# Patient Record
Sex: Male | Born: 1945
Health system: Southern US, Community
[De-identification: ages and names within clinical notes are randomized; demographics above are authoritative.]

## PROBLEM LIST (undated history)

## (undated) DIAGNOSIS — K219 Gastro-esophageal reflux disease without esophagitis: Secondary | ICD-10-CM

## (undated) DIAGNOSIS — Z860100 Personal history of colon polyps, unspecified: Secondary | ICD-10-CM

## (undated) DIAGNOSIS — K922 Gastrointestinal hemorrhage, unspecified: Secondary | ICD-10-CM

## (undated) DIAGNOSIS — I10 Essential (primary) hypertension: Secondary | ICD-10-CM

## (undated) DIAGNOSIS — E785 Hyperlipidemia, unspecified: Secondary | ICD-10-CM

## (undated) DIAGNOSIS — R928 Other abnormal and inconclusive findings on diagnostic imaging of breast: Secondary | ICD-10-CM

## (undated) DIAGNOSIS — D131 Benign neoplasm of stomach: Secondary | ICD-10-CM

## (undated) DIAGNOSIS — R198 Other specified symptoms and signs involving the digestive system and abdomen: Secondary | ICD-10-CM

## (undated) DIAGNOSIS — M1990 Unspecified osteoarthritis, unspecified site: Secondary | ICD-10-CM

## (undated) DIAGNOSIS — E039 Hypothyroidism, unspecified: Secondary | ICD-10-CM

## (undated) DIAGNOSIS — C801 Malignant (primary) neoplasm, unspecified: Secondary | ICD-10-CM

## (undated) HISTORY — DX: Benign neoplasm of stomach: D13.1

## (undated) HISTORY — DX: Gastro-esophageal reflux disease without esophagitis: K21.9

## (undated) HISTORY — DX: Personal history of colonic polyps: Z86.010

## (undated) HISTORY — DX: Hyperlipidemia, unspecified: E78.5

## (undated) HISTORY — DX: Essential (primary) hypertension: I10

## (undated) HISTORY — PX: UPPER GASTROINTESTINAL ENDOSCOPY: SHX188

## (undated) HISTORY — DX: Personal history of colon polyps, unspecified: Z86.0100

## (undated) HISTORY — PX: COLONOSCOPY: SHX174

## (undated) HISTORY — PX: TONSILLECTOMY: SUR1361

## (undated) HISTORY — DX: Other abnormal and inconclusive findings on diagnostic imaging of breast: R92.8

## (undated) HISTORY — DX: Gastrointestinal hemorrhage, unspecified: K92.2

---

## 1982-02-15 HISTORY — PX: TUMOR REMOVAL: SHX12

## 2002-12-28 ENCOUNTER — Encounter: Payer: Self-pay | Admitting: Internal Medicine

## 2003-02-16 DIAGNOSIS — K922 Gastrointestinal hemorrhage, unspecified: Secondary | ICD-10-CM

## 2003-02-16 HISTORY — DX: Gastrointestinal hemorrhage, unspecified: K92.2

## 2003-05-24 ENCOUNTER — Ambulatory Visit (HOSPITAL_COMMUNITY): Admission: RE | Admit: 2003-05-24 | Discharge: 2003-05-24 | Payer: Self-pay | Admitting: Internal Medicine

## 2003-05-24 ENCOUNTER — Encounter (INDEPENDENT_AMBULATORY_CARE_PROVIDER_SITE_OTHER): Payer: Self-pay | Admitting: *Deleted

## 2003-05-24 ENCOUNTER — Encounter: Payer: Self-pay | Admitting: Internal Medicine

## 2003-05-24 LAB — HM COLONOSCOPY

## 2003-06-04 ENCOUNTER — Observation Stay (HOSPITAL_COMMUNITY): Admission: EM | Admit: 2003-06-04 | Discharge: 2003-06-05 | Payer: Self-pay | Admitting: Emergency Medicine

## 2004-01-06 ENCOUNTER — Ambulatory Visit: Payer: Self-pay | Admitting: Internal Medicine

## 2004-09-23 ENCOUNTER — Ambulatory Visit: Payer: Self-pay | Admitting: Internal Medicine

## 2004-09-30 ENCOUNTER — Ambulatory Visit: Payer: Self-pay | Admitting: Internal Medicine

## 2005-02-10 ENCOUNTER — Ambulatory Visit: Payer: Self-pay | Admitting: Internal Medicine

## 2005-04-20 ENCOUNTER — Ambulatory Visit: Payer: Self-pay | Admitting: Internal Medicine

## 2005-04-23 ENCOUNTER — Ambulatory Visit: Payer: Self-pay | Admitting: Internal Medicine

## 2005-05-06 ENCOUNTER — Encounter: Payer: Self-pay | Admitting: Internal Medicine

## 2005-05-06 ENCOUNTER — Ambulatory Visit: Payer: Self-pay | Admitting: Internal Medicine

## 2005-05-07 ENCOUNTER — Ambulatory Visit: Payer: Self-pay | Admitting: Cardiovascular Disease

## 2005-05-27 ENCOUNTER — Ambulatory Visit: Payer: Self-pay | Admitting: Cardiovascular Disease

## 2005-05-27 ENCOUNTER — Ambulatory Visit (HOSPITAL_COMMUNITY): Admission: RE | Admit: 2005-05-27 | Discharge: 2005-05-27 | Payer: Self-pay | Admitting: Cardiovascular Disease

## 2005-07-13 ENCOUNTER — Ambulatory Visit: Payer: Self-pay | Admitting: Internal Medicine

## 2005-07-14 ENCOUNTER — Ambulatory Visit: Payer: Self-pay | Admitting: Internal Medicine

## 2005-07-30 ENCOUNTER — Ambulatory Visit: Payer: Self-pay | Admitting: Internal Medicine

## 2005-08-25 ENCOUNTER — Ambulatory Visit: Payer: Self-pay | Admitting: Internal Medicine

## 2005-11-22 ENCOUNTER — Ambulatory Visit: Payer: Self-pay | Admitting: Internal Medicine

## 2006-01-03 ENCOUNTER — Ambulatory Visit: Payer: Self-pay | Admitting: Internal Medicine

## 2006-02-09 ENCOUNTER — Emergency Department (HOSPITAL_COMMUNITY): Admission: EM | Admit: 2006-02-09 | Discharge: 2006-02-09 | Payer: Self-pay | Admitting: Emergency Medicine

## 2006-02-21 ENCOUNTER — Ambulatory Visit: Payer: Self-pay | Admitting: Internal Medicine

## 2006-02-28 ENCOUNTER — Ambulatory Visit: Payer: Self-pay | Admitting: Internal Medicine

## 2006-02-28 ENCOUNTER — Encounter: Admission: RE | Admit: 2006-02-28 | Discharge: 2006-02-28 | Payer: Self-pay | Admitting: Internal Medicine

## 2006-02-28 LAB — CONVERTED CEMR LAB: Uric Acid, Serum: 5.3 mg/dL (ref 2.4–7.0)

## 2006-04-11 ENCOUNTER — Ambulatory Visit: Payer: Self-pay | Admitting: Internal Medicine

## 2006-06-01 ENCOUNTER — Ambulatory Visit: Payer: Self-pay | Admitting: Internal Medicine

## 2006-08-18 DIAGNOSIS — I10 Essential (primary) hypertension: Secondary | ICD-10-CM | POA: Insufficient documentation

## 2006-08-18 DIAGNOSIS — Z860100 Personal history of colon polyps, unspecified: Secondary | ICD-10-CM | POA: Insufficient documentation

## 2006-08-18 DIAGNOSIS — M109 Gout, unspecified: Secondary | ICD-10-CM | POA: Insufficient documentation

## 2006-10-18 ENCOUNTER — Telehealth: Payer: Self-pay | Admitting: Internal Medicine

## 2006-10-28 ENCOUNTER — Telehealth: Payer: Self-pay | Admitting: Internal Medicine

## 2006-12-01 ENCOUNTER — Ambulatory Visit: Payer: Self-pay | Admitting: Internal Medicine

## 2006-12-01 LAB — CONVERTED CEMR LAB
ALT: 16 units/L (ref 0–53)
AST: 19 units/L (ref 0–37)
Albumin: 3.8 g/dL (ref 3.5–5.2)
Alkaline Phosphatase: 69 units/L (ref 39–117)
BUN: 14 mg/dL (ref 6–23)
Basophils Absolute: 0 10*3/uL (ref 0.0–0.1)
Basophils Relative: 0.4 % (ref 0.0–1.0)
Bilirubin Urine: NEGATIVE
Bilirubin, Direct: 0.2 mg/dL (ref 0.0–0.3)
CO2: 30 meq/L (ref 19–32)
Calcium: 9.1 mg/dL (ref 8.4–10.5)
Chloride: 107 meq/L (ref 96–112)
Cholesterol: 160 mg/dL (ref 0–200)
Creatinine, Ser: 0.9 mg/dL (ref 0.4–1.5)
Direct LDL: 86.8 mg/dL
Eosinophils Absolute: 0.3 10*3/uL (ref 0.0–0.6)
Eosinophils Relative: 5.3 % — ABNORMAL HIGH (ref 0.0–5.0)
GFR calc Af Amer: 110 mL/min
GFR calc non Af Amer: 91 mL/min
Glucose, Bld: 124 mg/dL — ABNORMAL HIGH (ref 70–99)
Glucose, Urine, Semiquant: NEGATIVE
HCT: 46.2 % (ref 39.0–52.0)
HDL: 32.8 mg/dL — ABNORMAL LOW (ref >39.0)
Hemoglobin: 16 g/dL (ref 13.0–17.0)
Ketones, urine, test strip: NEGATIVE
Lymphocytes Relative: 19.8 % (ref 12.0–46.0)
MCHC: 34.7 g/dL (ref 30.0–36.0)
MCV: 91.6 fL (ref 78.0–100.0)
Monocytes Absolute: 0.6 10*3/uL (ref 0.2–0.7)
Monocytes Relative: 9.4 % (ref 3.0–11.0)
Neutro Abs: 3.9 10*3/uL (ref 1.4–7.7)
Neutrophils Relative %: 65.1 % (ref 43.0–77.0)
Nitrite: NEGATIVE
PSA: 0.71 ng/mL (ref 0.10–4.00)
Platelets: 226 10*3/uL (ref 150–400)
Potassium: 4.1 meq/L (ref 3.5–5.1)
RBC: 5.05 M/uL (ref 4.22–5.81)
RDW: 12.1 % (ref 11.5–14.6)
Sodium: 142 meq/L (ref 135–145)
Specific Gravity, Urine: 1.025
TSH: 6.61 microintl units/mL — ABNORMAL HIGH (ref 0.35–5.50)
Total Bilirubin: 1 mg/dL (ref 0.3–1.2)
Total CHOL/HDL Ratio: 4.9
Total Protein: 6.2 g/dL (ref 6.0–8.3)
Triglycerides: 205 mg/dL (ref 0–149)
Urobilinogen, UA: 0.2
VLDL: 41 mg/dL — ABNORMAL HIGH (ref 0–40)
WBC Urine, dipstick: NEGATIVE
WBC: 6 10*3/uL (ref 4.5–10.5)
pH: 6.5

## 2006-12-09 ENCOUNTER — Ambulatory Visit: Payer: Self-pay | Admitting: Internal Medicine

## 2006-12-09 DIAGNOSIS — E785 Hyperlipidemia, unspecified: Secondary | ICD-10-CM | POA: Insufficient documentation

## 2007-01-23 ENCOUNTER — Ambulatory Visit: Payer: Self-pay | Admitting: Internal Medicine

## 2007-01-27 ENCOUNTER — Telehealth: Payer: Self-pay | Admitting: Internal Medicine

## 2007-03-06 ENCOUNTER — Ambulatory Visit: Payer: Self-pay | Admitting: Internal Medicine

## 2007-03-06 LAB — CONVERTED CEMR LAB
AST: 19 units/L (ref 0–37)
BUN: 14 mg/dL (ref 6–23)
Bilirubin, Direct: 0.1 mg/dL (ref 0.0–0.3)
Calcium: 9.1 mg/dL (ref 8.4–10.5)
Creatinine, Ser: 1.1 mg/dL (ref 0.4–1.5)
Direct LDL: 127.8 mg/dL
HDL: 32.2 mg/dL — ABNORMAL LOW (ref >39.0)
Hgb A1c MFr Bld: 6.4 % — ABNORMAL HIGH (ref 4.6–6.0)
Potassium: 4.2 meq/L (ref 3.5–5.1)
Total Bilirubin: 0.9 mg/dL (ref 0.3–1.2)
Total CHOL/HDL Ratio: 6.9
Total Protein: 6.5 g/dL (ref 6.0–8.3)

## 2007-03-10 ENCOUNTER — Ambulatory Visit: Payer: Self-pay | Admitting: Internal Medicine

## 2007-05-24 ENCOUNTER — Telehealth: Payer: Self-pay | Admitting: Internal Medicine

## 2007-05-24 ENCOUNTER — Ambulatory Visit: Payer: Self-pay | Admitting: Internal Medicine

## 2007-06-22 ENCOUNTER — Telehealth: Payer: Self-pay | Admitting: Internal Medicine

## 2007-07-06 ENCOUNTER — Telehealth: Payer: Self-pay | Admitting: Internal Medicine

## 2007-07-07 ENCOUNTER — Telehealth: Payer: Self-pay | Admitting: Internal Medicine

## 2007-08-28 ENCOUNTER — Ambulatory Visit: Payer: Self-pay | Admitting: Internal Medicine

## 2007-08-28 LAB — CONVERTED CEMR LAB
AST: 23 units/L (ref 0–37)
Albumin: 3.6 g/dL (ref 3.5–5.2)
Alkaline Phosphatase: 58 units/L (ref 39–117)
CO2: 30 meq/L (ref 19–32)
Cholesterol: 148 mg/dL (ref 0–200)
Creatinine, Ser: 1 mg/dL (ref 0.4–1.5)
Direct LDL: 75 mg/dL
GFR calc Af Amer: 97 mL/min
Hgb A1c MFr Bld: 6.6 % — ABNORMAL HIGH (ref 4.6–6.0)
Total CHOL/HDL Ratio: 4.4
Total Protein: 6.3 g/dL (ref 6.0–8.3)
Triglycerides: 239 mg/dL (ref 0–149)

## 2007-09-01 ENCOUNTER — Ambulatory Visit: Payer: Self-pay | Admitting: Internal Medicine

## 2007-11-22 ENCOUNTER — Telehealth: Payer: Self-pay | Admitting: Internal Medicine

## 2007-12-18 ENCOUNTER — Ambulatory Visit: Payer: Self-pay | Admitting: Internal Medicine

## 2007-12-29 ENCOUNTER — Encounter: Payer: Self-pay | Admitting: Internal Medicine

## 2007-12-29 ENCOUNTER — Telehealth: Payer: Self-pay | Admitting: Internal Medicine

## 2008-01-09 ENCOUNTER — Encounter: Payer: Self-pay | Admitting: Internal Medicine

## 2008-02-26 ENCOUNTER — Ambulatory Visit: Payer: Self-pay | Admitting: Internal Medicine

## 2008-02-26 LAB — CONVERTED CEMR LAB
Alkaline Phosphatase: 61 units/L (ref 39–117)
Bilirubin Urine: NEGATIVE
Bilirubin, Direct: 0.1 mg/dL (ref 0.0–0.3)
Chloride: 103 meq/L (ref 96–112)
Direct LDL: 115.9 mg/dL
Eosinophils Absolute: 0.3 10*3/uL (ref 0.0–0.7)
GFR calc Af Amer: 110 mL/min
GFR calc non Af Amer: 91 mL/min
Glucose, Bld: 119 mg/dL — ABNORMAL HIGH (ref 70–99)
Glucose, Urine, Semiquant: NEGATIVE
Lymphocytes Relative: 24 % (ref 12.0–46.0)
MCHC: 35.2 g/dL (ref 30.0–36.0)
MCV: 92.3 fL (ref 78.0–100.0)
Monocytes Absolute: 0.4 10*3/uL (ref 0.1–1.0)
Monocytes Relative: 8.8 % (ref 3.0–12.0)
Neutrophils Relative %: 61.1 % (ref 43.0–77.0)
Platelets: 208 10*3/uL (ref 150–400)
RBC: 5.04 M/uL (ref 4.22–5.81)
Sodium: 139 meq/L (ref 135–145)
TSH: 4.59 microintl units/mL (ref 0.35–5.50)
Total Bilirubin: 0.9 mg/dL (ref 0.3–1.2)
Total CHOL/HDL Ratio: 5.9
Triglycerides: 273 mg/dL (ref 0–149)
Urobilinogen, UA: 0.2
pH: 6

## 2008-02-29 ENCOUNTER — Ambulatory Visit: Payer: Self-pay | Admitting: Internal Medicine

## 2008-03-06 ENCOUNTER — Telehealth: Payer: Self-pay | Admitting: Internal Medicine

## 2008-03-22 ENCOUNTER — Ambulatory Visit: Payer: Self-pay | Admitting: Internal Medicine

## 2008-04-03 ENCOUNTER — Telehealth: Payer: Self-pay | Admitting: Internal Medicine

## 2008-04-03 DIAGNOSIS — R928 Other abnormal and inconclusive findings on diagnostic imaging of breast: Secondary | ICD-10-CM | POA: Insufficient documentation

## 2008-04-03 HISTORY — DX: Other abnormal and inconclusive findings on diagnostic imaging of breast: R92.8

## 2008-04-30 ENCOUNTER — Telehealth: Payer: Self-pay | Admitting: Internal Medicine

## 2008-05-10 ENCOUNTER — Encounter: Admission: RE | Admit: 2008-05-10 | Discharge: 2008-05-10 | Payer: Self-pay | Admitting: Internal Medicine

## 2008-05-10 LAB — HM MAMMOGRAPHY

## 2008-12-10 ENCOUNTER — Telehealth: Payer: Self-pay | Admitting: Internal Medicine

## 2008-12-30 ENCOUNTER — Encounter (INDEPENDENT_AMBULATORY_CARE_PROVIDER_SITE_OTHER): Payer: Self-pay | Admitting: *Deleted

## 2009-01-01 ENCOUNTER — Telehealth: Payer: Self-pay | Admitting: Internal Medicine

## 2009-01-02 ENCOUNTER — Telehealth: Payer: Self-pay | Admitting: Internal Medicine

## 2009-01-03 ENCOUNTER — Ambulatory Visit: Payer: Self-pay | Admitting: Internal Medicine

## 2009-01-03 LAB — CONVERTED CEMR LAB
AST: 16 units/L (ref 0–37)
Albumin: 4 g/dL (ref 3.5–5.2)
Alkaline Phosphatase: 63 units/L (ref 39–117)
Basophils Relative: 0.5 % (ref 0.0–3.0)
Bilirubin, Direct: 0.1 mg/dL (ref 0.0–0.3)
Calcium: 8.9 mg/dL (ref 8.4–10.5)
Creatinine, Ser: 1 mg/dL (ref 0.4–1.5)
HCT: 48.9 % (ref 39.0–52.0)
Ketones, ur: NEGATIVE mg/dL
Leukocytes, UA: NEGATIVE
Lymphocytes Relative: 21.4 % (ref 12.0–46.0)
Lymphs Abs: 1.3 10*3/uL (ref 0.7–4.0)
MCV: 94.1 fL (ref 78.0–100.0)
Neutrophils Relative %: 66 % (ref 43.0–77.0)
Nitrite: NEGATIVE
Platelets: 194 10*3/uL (ref 150.0–400.0)
RBC: 5.19 M/uL (ref 4.22–5.81)
Sodium: 140 meq/L (ref 135–145)
Specific Gravity, Urine: 1.015 (ref 1.000–1.030)
TSH: 5.72 microintl units/mL — ABNORMAL HIGH (ref 0.35–5.50)
Total Bilirubin: 0.8 mg/dL (ref 0.3–1.2)
Total Protein: 6.8 g/dL (ref 6.0–8.3)
Urobilinogen, UA: 0.2 (ref 0.0–1.0)
WBC: 6.2 10*3/uL (ref 4.5–10.5)

## 2009-01-14 ENCOUNTER — Ambulatory Visit: Payer: Self-pay | Admitting: Internal Medicine

## 2009-02-15 HISTORY — PX: KNEE ARTHROSCOPY: SUR90

## 2009-02-21 ENCOUNTER — Ambulatory Visit (HOSPITAL_BASED_OUTPATIENT_CLINIC_OR_DEPARTMENT_OTHER): Admission: RE | Admit: 2009-02-21 | Discharge: 2009-02-21 | Payer: Self-pay | Admitting: Specialist

## 2009-03-03 ENCOUNTER — Encounter: Payer: Self-pay | Admitting: Internal Medicine

## 2009-04-10 ENCOUNTER — Ambulatory Visit: Payer: Self-pay | Admitting: Internal Medicine

## 2009-04-10 LAB — CONVERTED CEMR LAB
AST: 18 units/L (ref 0–37)
Albumin: 3.8 g/dL (ref 3.5–5.2)
Alkaline Phosphatase: 78 units/L (ref 39–117)
Cholesterol: 198 mg/dL (ref 0–200)
HDL: 44.1 mg/dL (ref >39.00)
Total CHOL/HDL Ratio: 4
Total Protein: 6.9 g/dL (ref 6.0–8.3)
Triglycerides: 268 mg/dL — ABNORMAL HIGH (ref 0.0–149.0)

## 2009-04-17 ENCOUNTER — Ambulatory Visit: Payer: Self-pay | Admitting: Internal Medicine

## 2009-05-05 ENCOUNTER — Telehealth: Payer: Self-pay | Admitting: Internal Medicine

## 2009-05-13 ENCOUNTER — Ambulatory Visit: Payer: Self-pay | Admitting: Internal Medicine

## 2009-05-19 ENCOUNTER — Telehealth: Payer: Self-pay | Admitting: Internal Medicine

## 2009-08-27 ENCOUNTER — Ambulatory Visit: Payer: Self-pay | Admitting: Internal Medicine

## 2009-11-20 ENCOUNTER — Ambulatory Visit: Payer: Self-pay | Admitting: Internal Medicine

## 2009-11-27 ENCOUNTER — Telehealth: Payer: Self-pay | Admitting: Internal Medicine

## 2009-12-02 ENCOUNTER — Encounter: Payer: Self-pay | Admitting: Internal Medicine

## 2010-02-27 ENCOUNTER — Ambulatory Visit
Admission: RE | Admit: 2010-02-27 | Discharge: 2010-02-27 | Payer: Self-pay | Source: Home / Self Care | Attending: Internal Medicine | Admitting: Internal Medicine

## 2010-02-27 ENCOUNTER — Other Ambulatory Visit: Payer: Self-pay | Admitting: Internal Medicine

## 2010-02-27 DIAGNOSIS — M1990 Unspecified osteoarthritis, unspecified site: Secondary | ICD-10-CM | POA: Insufficient documentation

## 2010-02-27 LAB — HEPATIC FUNCTION PANEL
ALT: 16 U/L (ref 0–53)
AST: 19 U/L (ref 0–37)
Albumin: 3.7 g/dL (ref 3.5–5.2)
Alkaline Phosphatase: 73 U/L (ref 39–117)
Bilirubin, Direct: 0.1 mg/dL (ref 0.0–0.3)
Total Bilirubin: 0.8 mg/dL (ref 0.3–1.2)
Total Protein: 6.2 g/dL (ref 6.0–8.3)

## 2010-02-27 LAB — BASIC METABOLIC PANEL
BUN: 16 mg/dL (ref 6–23)
CO2: 30 mEq/L (ref 19–32)
Calcium: 8.8 mg/dL (ref 8.4–10.5)
Chloride: 103 mEq/L (ref 96–112)
Creatinine, Ser: 1 mg/dL (ref 0.4–1.5)
GFR: 78.02 mL/min (ref >60.00)
Glucose, Bld: 109 mg/dL — ABNORMAL HIGH (ref 70–99)
Potassium: 4.6 mEq/L (ref 3.5–5.1)
Sodium: 140 mEq/L (ref 135–145)

## 2010-02-27 LAB — LIPID PANEL
Cholesterol: 191 mg/dL (ref 0–200)
HDL: 37 mg/dL — ABNORMAL LOW (ref >39.00)
Total CHOL/HDL Ratio: 5
Triglycerides: 254 mg/dL — ABNORMAL HIGH (ref 0.0–149.0)
VLDL: 50.8 mg/dL — ABNORMAL HIGH (ref 0.0–40.0)

## 2010-02-27 LAB — LDL CHOLESTEROL, DIRECT: Direct LDL: 122.2 mg/dL

## 2010-03-17 NOTE — Assessment & Plan Note (Signed)
Summary: 4 month rov/njr/pt rescd//ccm   Vital Signs:  Patient profile:   65 year old male Weight:      256 pounds BMI:     37.40 Temp:     97.5 degrees F oral Pulse rate:   92 / minute Pulse rhythm:   regular Resp:     14 per minute BP sitting:   140 / 82  (left arm) Cuff size:   regular  Vitals Entered By: Gladis Riffle, RN (August 27, 2009 7:56 AM) CC: 4 month rov Is Patient Diabetic? No   CC:  4 month rov.  History of Present Illness:  Follow-Up Visit      This is a [name] who presents for Follow-up visit.  The patient denies chest pain and palpitations.  Since the last visit the patient notes no new problems or concerns.  The patient reports taking meds as prescribed.  When questioned about possible medication side effects, the patient notes none.   no recurrent gout  All other systems reviewed and were negative   Preventive Screening-Counseling & Management  Alcohol-Tobacco     Smoking Status: quit > 6 months     Year Started: 1968     Year Quit: 1968  Current Problems (verified): 1)  Preoperative Examination  (ICD-V72.84) 2)  Mammogram, Abnormal, Left  (ICD-793.80) 3)  Hyperlipidemia  (ICD-272.4) 4)  Physical Examination  (ICD-V70.0) 5)  Hypertension  (ICD-401.9) 6)  Gout  (ICD-274.9) 7)  Colonic Polyps, Hx of  (ICD-V12.72)  Current Medications (verified): 1)  Metoprolol Succinate 50 Mg  Tb24 (Metoprolol Succinate) .Marland Kitchen.. 1 By Mouth Once Daily 2)  Aspirin 81 Mg Tabs (Aspirin) .... Once Daily 3)  Azor 10-40 Mg  Tabs (Amlodipine-Olmesartan) .... Take 1 Tab By Mouth Every Day 4)  Omeprazole 20 Mg Tbec (Omeprazole) .... Take One Tab Once Daily 5)  Shakley Vitamin For Cholesterol .... Two Times A Day 6)  Acyclovir 400 Mg Tabs (Acyclovir) .... Take One Five Times A Day As Needed Cold Sores  Allergies: 1)  ! Ace Inhibitors 2)  ! Doxycycline 3)  ! Lipitor  Past History:  Past Medical History: Last updated: 2006/12/18 Colonic polyps, hx  of-hyperplastic Gout Hypertension GI bleed after colonoscopy-2005 Hyperlipidemia  Past Surgical History: Last updated: 04/17/2009 1984-tumor removed from Jaw knee arthroscopy 2011 (dr beane)  Family History: Last updated: 18-Dec-2006 mother deceased stoke, and DM, renal failure father deceased 68 sibling deceased MVA Family History Diabetes 1st degree relative Family History Hypertension  Social History: Last updated: 18-Dec-2006 Former Smoker Occupation: Married Regular exercise-no  Risk Factors: Exercise: no (December 18, 2006)  Risk Factors: Smoking Status: quit > 6 months (08/27/2009)  Physical Exam  General:  Well-developed,well-nourished,in no acute distress; alert,appropriate and cooperative throughout examination  overweight Head:  normocephalic and atraumatic.   Eyes:  pupils equal and pupils round.   Ears:  R ear normal and L ear normal.   Neck:  No deformities, masses, or tenderness noted. Chest Wall:  No deformities, masses, tenderness or gynecomastia noted. Lungs:  normal respiratory effort and no intercostal retractions.   Heart:  normal rate and regular rhythm.   Abdomen:  Bowel sounds positive,abdomen soft and non-tender without masses, organomegaly or hernias noted. overweight   Impression & Recommendations:  Problem # 1:  HYPERTENSION (ICD-401.9) Home bps 140/60s His updated medication list for this problem includes:    Metoprolol Succinate 50 Mg Tb24 (Metoprolol succinate) .Marland Kitchen... 1 by mouth once daily    Azor 10-40 Mg Tabs (  Amlodipine-olmesartan) .Marland Kitchen... Take 1 tab by mouth every day  BP today: 140/82 Prior BP: 148/80 (05/13/2009)  Labs Reviewed: K+: 3.8 (01/03/2009) Creat: : 1.0 (01/03/2009)   Chol: 198 (04/10/2009)   HDL: 44.10 (04/10/2009)   LDL: DEL (02/26/2008)   TG: 268.0 (04/10/2009)  Problem # 2:  GOUT (ICD-274.9) no recurrence  Problem # 3:  HYPERLIPIDEMIA (ICD-272.4)  Labs Reviewed: SGOT: 18 (04/10/2009)   SGPT: 18 (04/10/2009)    HDL:44.10 (04/10/2009), 36.30 (01/03/2009)  LDL:DEL (02/26/2008), DEL (08/28/2007)  Chol:198 (04/10/2009), 214 (01/03/2009)  Trig:268.0 (04/10/2009), 190.0 (01/03/2009)  Complete Medication List: 1)  Metoprolol Succinate 50 Mg Tb24 (Metoprolol succinate) .Marland Kitchen.. 1 by mouth once daily 2)  Aspirin 81 Mg Tabs (Aspirin) .... Once daily 3)  Azor 10-40 Mg Tabs (Amlodipine-olmesartan) .... Take 1 tab by mouth every day 4)  Omeprazole 20 Mg Tbec (Omeprazole) .... Take one tab once daily 5)  Shakley Vitamin For Cholesterol  .... Two times a day 6)  Acyclovir 400 Mg Tabs (Acyclovir) .... Take one five times a day as needed cold sores  Patient Instructions: 1)  6 months 2)  You need to lose weight. Consider a lower calorie diet and regular exercise.

## 2010-03-17 NOTE — Progress Notes (Signed)
Summary: doxy effect  Phone Note Call from Patient Call back at [phone]   Summary of Call: The doxy causes really bad stomach aches & gas.  1/2 tabs left.  What to do? Initial call taken by: Rudy Jew, RN,  May 19, 2009 1:54 PM  Follow-up for Phone Call        still has 5 days Rx left.  States bronchitis is some better.  Having stomach pains and flatus that is uncomfortable and embarrassing.  Rx says not to take antacids or anything so wondering what to do. Follow-up by: Gladis Riffle, RN,  May 19, 2009 3:55 PM  Additional Follow-up for Phone Call Additional follow up Details #1::        stop doxycycline call in zpack Additional Follow-up by: Birdie Sons MD,  May 19, 2009 4:16 PM   New Allergies: ! DOXYCYCLINE Additional Follow-up for Phone Call Additional follow up Details #2::    Patient notified.   See Rx Follow-up by: Gladis Riffle, RN,  May 19, 2009 4:27 PM  New/Updated Medications: ZITHROMAX Z-PAK 250 MG TABS (AZITHROMYCIN) Take as directed New Allergies: ! DOXYCYCLINE

## 2010-03-17 NOTE — Letter (Signed)
Summary: [facility]  [facility]   Imported By: Maryln Gottron 12/15/2009 12:14:05  _____________________________________________________________________  External Attachment:    Type:   Image     Comment:   External Document

## 2010-03-17 NOTE — Progress Notes (Signed)
Summary: refill--omeprazole  Phone Note Refill Request Message from:  Fax from [facility] on November 27, 2009 12:25 PM  Refills Requested: Medication #1:  OMEPRAZOLE 20 MG TBEC take one tab once daily   Dosage confirmed as above?Dosage Confirmed   Supply Requested: 3 months   Last Refilled: 10/18/2009 Next Appointment Scheduled: 02-25-10 Dr Cato Mulligan Initial call taken by: Mervin Kung CMA Duncan Dull),  November 27, 2009 12:26 PM    Prescriptions: OMEPRAZOLE 20 MG TBEC (OMEPRAZOLE) take one tab once daily  #90 x 1   Entered by:   Mervin Kung CMA (AAMA)   Authorized by:   Birdie Sons MD   Signed by:   Mervin Kung CMA (AAMA) on 11/27/2009   Method used:   Faxed to ...       [facility]       [address] [place], [place]  [address]       Ph: [phone]       Fax: [phone]   RxID:   [id number]

## 2010-03-17 NOTE — Progress Notes (Signed)
Summary: hydrocodone syrup rx  Phone Note From Pharmacy   Summary of Call: patient requests a refill of hydrocodone syrup is this okay to fill? Initial call taken by: Kern Reap CMA Duncan Dull),  May 05, 2009 3:05 PM  Follow-up for Phone Call        ok x one Follow-up by: Birdie Sons MD,  May 05, 2009 4:14 PM  Additional Follow-up for Phone Call Additional follow up Details #1::        Pharmacist called Additional Follow-up by: Kern Reap CMA Duncan Dull),  May 05, 2009 4:21 PM

## 2010-03-17 NOTE — Assessment & Plan Note (Signed)
Summary: ?gout/njr rsc appt time/njr   Vital Signs:  Patient profile:   65 year old male Weight:      252 pounds Temp:     98.2 degrees F oral BP sitting:   162 / 92  (left arm) Cuff size:   large  Vitals Entered By: Sid Falcon LPN (November 20, 2009 11:06 AM)  History of Present Illness: Foot pain can be severe subacute onset and intermittent no swelling or erythema no fever or chills  All other systems reviewed and were negative   Current Problems (verified): 1)  Preoperative Examination  (ICD-V72.84) 2)  Mammogram, Abnormal, Left  (ICD-793.80) 3)  Hyperlipidemia  (ICD-272.4) 4)  Physical Examination  (ICD-V70.0) 5)  Hypertension  (ICD-401.9) 6)  Gout  (ICD-274.9) 7)  Colonic Polyps, Hx of  (ICD-V12.72)  Allergies: 1)  ! Ace Inhibitors 2)  ! Doxycycline 3)  ! Lipitor  Past History:  Past Medical History: Last updated: 2006-12-12 Colonic polyps, hx of-hyperplastic Gout Hypertension GI bleed after colonoscopy-2005 Hyperlipidemia  Past Surgical History: Last updated: 04/17/2009 1984-tumor removed from Jaw knee arthroscopy 2011 (dr beane)  Family History: Last updated: Dec 12, 2006 mother deceased stoke, and DM, renal failure father deceased 11 sibling deceased MVA Family History Diabetes 1st degree relative Family History Hypertension  Social History: Last updated: Dec 12, 2006 Former Smoker Occupation: Married Regular exercise-no  Risk Factors: Exercise: no (12-Dec-2006)  Risk Factors: Smoking Status: quit > 6 months (08/27/2009)  Physical Exam  General:  alert and well-developed.   Head:  normocephalic and atraumatic.   Eyes:  pupils equal and pupils round.   Neck:  No deformities, masses, or tenderness noted. Lungs:  normal respiratory effort and no intercostal retractions.   Msk:  FROM both ankles no swelling or erythema of either foot   Impression & Recommendations:  Problem # 1:  GOUT (ICD-274.9) His description of current sxs is  not gout has appt with Lestine Box--- he will keep appt.  no other Rx for now  Complete Medication List: 1)  Metoprolol Succinate 50 Mg Tb24 (Metoprolol succinate) .Marland Kitchen.. 1 by mouth once daily 2)  Aspirin 81 Mg Tabs (Aspirin) .... Once daily 3)  Azor 10-40 Mg Tabs (Amlodipine-olmesartan) .... Take 1 tab by mouth every day 4)  Omeprazole 20 Mg Tbec (Omeprazole) .... Take one tab once daily 5)  Shakley Vitamin For Cholesterol  .... Two times a day 6)  Acyclovir 400 Mg Tabs (Acyclovir) .... Take one five times a day as needed cold sores

## 2010-03-17 NOTE — Assessment & Plan Note (Signed)
Summary: 3 month rov/njr   Vital Signs:  Patient profile:   65 year old male Weight:      255 pounds Temp:     98.1 degrees F oral Pulse rate:   60 / minute Pulse rhythm:   regular Resp:     12 per minute BP sitting:   164 / 90  (left arm) Cuff size:   regular  Vitals Entered By: Gladis Riffle, RN (April 17, 2009 8:02 AM) CC: 3 month rov, labs done--also c/o cough lasting 3 weeks Is Patient Diabetic? No   CC:  3 month rov and labs done--also c/o cough lasting 3 weeks.  History of Present Illness:  Follow-Up Visit      This is a 65 year old man who presents for Follow-up visit.  The patient denies chest pain and palpitations.  Since the last visit the patient notes no new problems or concerns.  The patient reports taking meds as prescribed.  When questioned about possible medication side effects, the patient notes none.    All other systems reviewed and were negative   Preventive Screening-Counseling & Management  Alcohol-Tobacco     Smoking Status: quit > 6 months     Year Started: 1968     Year Quit: 1968  Current Problems (verified): 1)  Preoperative Examination  (ICD-V72.84) 2)  Mammogram, Abnormal, Left  (ICD-793.80) 3)  Hyperlipidemia  (ICD-272.4) 4)  Physical Examination  (ICD-V70.0) 5)  Hypertension  (ICD-401.9) 6)  Gout  (ICD-274.9) 7)  Colonic Polyps, Hx of  (ICD-V12.72)  Current Medications (verified): 1)  Metoprolol Succinate 50 Mg  Tb24 (Metoprolol Succinate) .Marland Kitchen.. 1 By Mouth Once Daily 2)  Aspirin 81 Mg Tabs (Aspirin) .... Once Daily 3)  Aciphex 20 Mg Tbec (Rabeprazole Sodium) .... Take 1 Tab Every Morning 4)  Azor 5-40 Mg Tabs (Amlodipine-Olmesartan) .... Take One Tab Once Daily 5)  Omeprazole 20 Mg Tbec (Omeprazole) .... Take One Tab Once Daily 6)  Shakley Vitamin For Cholesterol .... Two Times A Day  Allergies: 1)  ! Ace Inhibitors  Past History:  Past Surgical History: 1984-tumor removed from Jaw knee arthroscopy 2011 (dr beane)  Physical  Exam  General:  Well-developed,well-nourished,in no acute distress; alert,appropriate and cooperative throughout examination  overweight Head:  normocephalic and atraumatic.   Eyes:  pupils equal and pupils round.   Ears:  R ear normal and L ear normal.   Neck:  No deformities, masses, or tenderness noted. Chest Wall:  No deformities, masses, tenderness or gynecomastia noted. Lungs:  Normal respiratory effort, chest expands symmetrically. Lungs are clear to auscultation, no crackles or wheezes. Heart:  Normal rate and regular rhythm. S1 and S2 normal without gallop, murmur, click, rub or other extra sounds. Abdomen:  Bowel sounds positive,abdomen soft and non-tender without masses, organomegaly or hernias noted. overweight Msk:  No deformity or scoliosis noted of thoracic or lumbar spine.   Pulses:  R radial normal and L radial normal.   Neurologic:  cranial nerves II-XII intact and gait normal.     Impression & Recommendations:  Problem # 1:  HYPERLIPIDEMIA (ICD-272.4) improved---no meds Labs Reviewed: SGOT: 18 (04/10/2009)   SGPT: 18 (04/10/2009)   HDL:44.10 (04/10/2009), 36.30 (01/03/2009)  LDL:DEL (02/26/2008), DEL (08/28/2007)  Chol:198 (04/10/2009), 214 (01/03/2009)  Trig:268.0 (04/10/2009), 190.0 (01/03/2009)  Problem # 2:  HYPERTENSION (ICD-401.9) home BPs 150/65-70 His updated medication list for this problem includes:    Metoprolol Succinate 50 Mg Tb24 (Metoprolol succinate) .Marland Kitchen... 1 by mouth once daily  Azor 10-40 Mg Tabs (Amlodipine-olmesartan) .Marland Kitchen... Take 1 tab by mouth every day  BP today: 164/90 Prior BP: 170/96 (01/14/2009)  Labs Reviewed: K+: 3.8 (01/03/2009) Creat: : 1.0 (01/03/2009)   Chol: 198 (04/10/2009)   HDL: 44.10 (04/10/2009)   LDL: DEL (02/26/2008)   TG: 268.0 (04/10/2009)  Problem # 3:  GOUT (ICD-274.9) no recurrence  Complete Medication List: 1)  Metoprolol Succinate 50 Mg Tb24 (Metoprolol succinate) .Marland Kitchen.. 1 by mouth once daily 2)  Aspirin 81  Mg Tabs (Aspirin) .... Once daily 3)  Azor 10-40 Mg Tabs (Amlodipine-olmesartan) .... Take 1 tab by mouth every day 4)  Omeprazole 20 Mg Tbec (Omeprazole) .... Take one tab once daily 5)  Shakley Vitamin For Cholesterol  .... Two times a day 6)  Hydromet 5-1.5 Mg/74ml Syrp (Hydrocodone-homatropine) .Marland Kitchen.. 1 tsp two times a day as needed cough  Patient Instructions: 1)  Please schedule a follow-up appointment in 4 months. Prescriptions: HYDROMET 5-1.5 MG/5ML SYRP (HYDROCODONE-HOMATROPINE) 1 tsp two times a day as needed cough  #240 x 0   Entered and Authorized by:   Birdie Sons MD   Signed by:   Birdie Sons MD on 04/17/2009   Method used:   Print then Give to Patient   RxID:   [id number] OMEPRAZOLE 20 MG TBEC (OMEPRAZOLE) take one tab once daily  #90 x 1   Entered and Authorized by:   Birdie Sons MD   Signed by:   Birdie Sons MD on 04/17/2009   Method used:   Print then Give to Patient   RxID:   [id number] ACIPHEX 20 MG TBEC (RABEPRAZOLE SODIUM) Take 1 tab every morning  #90 x 3   Entered and Authorized by:   Birdie Sons MD   Signed by:   Birdie Sons MD on 04/17/2009   Method used:   Print then Give to Patient   RxID:   [id number] METOPROLOL SUCCINATE 50 MG  TB24 (METOPROLOL SUCCINATE) 1 by mouth once daily  #90 x 1   Entered and Authorized by:   Birdie Sons MD   Signed by:   Birdie Sons MD on 04/17/2009   Method used:   Print then Give to Patient   RxID:   [id number]  pt is not on aciphex---Rx not given to him

## 2010-03-17 NOTE — Progress Notes (Signed)
Summary: refill--Metoprolol  Phone Note Refill Request Message from:  Fax from [facility] on November 27, 2009 12:27 PM  Refills Requested: Medication #1:  METOPROLOL SUCCINATE 50 MG  TB24 1 by mouth once daily   Dosage confirmed as above?Dosage Confirmed   Supply Requested: 3 months   Last Refilled: 10/18/2009 Next Appointment Scheduled: 02-25-10 Dr Cato Mulligan Initial call taken by: Mervin Kung CMA Duncan Dull),  November 27, 2009 12:27 PM    Prescriptions: METOPROLOL SUCCINATE 50 MG  TB24 (METOPROLOL SUCCINATE) 1 by mouth once daily  #90 x 1   Entered by:   Mervin Kung CMA (AAMA)   Authorized by:   Birdie Sons MD   Signed by:   Mervin Kung CMA (AAMA) on 11/27/2009   Method used:   Faxed to ...       [facility] (mail[facility])       [address] [place], [place]  [address]       Ph: [phone]       Fax: [phone]   RxID:   [id number]

## 2010-03-17 NOTE — Assessment & Plan Note (Signed)
Summary: cough/congestion/sorethroat/cjr   Vital Signs:  Patient profile:   65 year old male Temp:     98.4 degrees F oral Pulse rate:   68 / minute Pulse rhythm:   regular Resp:     20 per minute BP sitting:   148 / 80  (left arm)  Vitals Entered By: Gladis Riffle, RN (May 13, 2009 1:33 PM) CC: c/o cough, congestion, and sore throat x 1 week--has been on cough syrup x 2 weeks Is Patient Diabetic? No Comments requests samples omeprazole   CC:  c/o cough, congestion, and and sore throat x 1 week--has been on cough syrup x 2 weeks.  History of Present Illness: URI sxs 2 weeks no fever or chills/sweats no SOB  All other systems reviewed and were negative   Preventive Screening-Counseling & Management  Alcohol-Tobacco     Smoking Status: quit > 6 months     Year Started: 1968     Year Quit: 1968  Current Medications (verified): 1)  Metoprolol Succinate 50 Mg  Tb24 (Metoprolol Succinate) .Marland Kitchen.. 1 By Mouth Once Daily 2)  Aspirin 81 Mg Tabs (Aspirin) .... Once Daily 3)  Azor 10-40 Mg  Tabs (Amlodipine-Olmesartan) .... Take 1 Tab By Mouth Every Day 4)  Omeprazole 20 Mg Tbec (Omeprazole) .... Take One Tab Once Daily 5)  Shakley Vitamin For Cholesterol .... Two Times A Day 6)  Hydromet 5-1.5 Mg/75ml Syrp (Hydrocodone-Homatropine) .Marland Kitchen.. 1 Tsp Two Times A Day As Needed Cough  Allergies: 1)  ! Ace Inhibitors  Past History:  Past Medical History: Last updated: December 29, 2006 Colonic polyps, hx of-hyperplastic Gout Hypertension GI bleed after colonoscopy-2005 Hyperlipidemia  Past Surgical History: Last updated: 04/17/2009 1984-tumor removed from Jaw knee arthroscopy 2011 (dr beane)  Family History: Last updated: 12/29/06 mother deceased stoke, and DM, renal failure father deceased 72 sibling deceased MVA Family History Diabetes 1st degree relative Family History Hypertension  Social History: Last updated: 29-Dec-2006 Former Smoker Occupation: Married Regular  exercise-no  Risk Factors: Exercise: no (2006-12-29)  Risk Factors: Smoking Status: quit > 6 months (05/13/2009)  Review of Systems       All other systems reviewed and were negative   Physical Exam  General:  Well-developed,well-nourished,in no acute distress; alert,appropriate and cooperative throughout examination  overweight Head:  normocephalic and atraumatic.   Eyes:  pupils equal and pupils round.   Neck:  No deformities, masses, or tenderness noted. Chest Wall:  No deformities, masses, tenderness or gynecomastia noted. Lungs:  normal respiratory effort, no intercostal retractions, and no accessory muscle use.   few rhonchi bilaterally   Impression & Recommendations:  Problem # 1:  BRONCHITIS-ACUTE (ICD-466.0)  emperic ABX based on hx His updated medication list for this problem includes:    Hydromet 5-1.5 Mg/39ml Syrp (Hydrocodone-homatropine) .Marland Kitchen... 1 tsp two times a day as needed cough    Doxycycline Hyclate 100 Mg Caps (Doxycycline hyclate) .Marland Kitchen... Take 1 tab twice a day  Take antibiotics and other medications as directed. Encouraged to push clear liquids, get enough rest, and take acetaminophen as needed. To be seen in 5-7 days if no improvement, sooner if worse.  Complete Medication List: 1)  Metoprolol Succinate 50 Mg Tb24 (Metoprolol succinate) .Marland Kitchen.. 1 by mouth once daily 2)  Aspirin 81 Mg Tabs (Aspirin) .... Once daily 3)  Azor 10-40 Mg Tabs (Amlodipine-olmesartan) .... Take 1 tab by mouth every day 4)  Omeprazole 20 Mg Tbec (Omeprazole) .... Take one tab once daily 5)  Shakley Vitamin For Cholesterol  .Marland KitchenMarland KitchenMarland Kitchen  Two times a day 6)  Hydromet 5-1.5 Mg/45ml Syrp (Hydrocodone-homatropine) .Marland Kitchen.. 1 tsp two times a day as needed cough 7)  Doxycycline Hyclate 100 Mg Caps (Doxycycline hyclate) .... Take 1 tab twice a day Prescriptions: DOXYCYCLINE HYCLATE 100 MG CAPS (DOXYCYCLINE HYCLATE) Take 1 tab twice a day  #20 x 0   Entered and Authorized by:   Birdie Sons MD   Signed  by:   Birdie Sons MD on 05/13/2009   Method used:   [facility]ron[facility] to        [facility]  [phone]* (retail)       [address] [place], [place]  [address]       Ph: [phone] or [phone]       Fax: [phone]   RxID:   [id number]

## 2010-03-17 NOTE — Letter (Signed)
Summary: [facility]  [facility]   Imported By: Maryln Gottron 03/21/2009 09:24:15  _____________________________________________________________________  External Attachment:    Type:   Image     Comment:   External Document

## 2010-03-19 NOTE — Assessment & Plan Note (Signed)
Summary: 65 month fup//ccm pt r/s from bump//slm   Vital Signs:  Patient profile:   65 year old male Weight:      260 pounds Temp:     98.7 degrees F oral BP sitting:   130 / 80  (left arm) Cuff size:   large  Vitals Entered By: Azucena Freed,  MA Student CC:  6 mon rov---diwell Is Patient Diabetic? No   CC:   6 mon rov---diwell.  History of Present Illness:  Follow-Up Visit      This is a 65 year old man who presents for Follow-up visit.  The patient denies chest pain and palpitations.  Since the last visit the patient notes no new problems or concerns.  The patient reports taking meds as prescribed.  When questioned about possible medication side effects, the patient notes none.   Foot pain---orthotics helping significantly  Current Medications (verified): 1)  Metoprolol Succinate 50 Mg  Tb24 (Metoprolol Succinate) .Marland Kitchen.. 1 By Mouth Once Daily 2)  Aspirin 81 Mg Tabs (Aspirin) .... Once Daily 3)  Azor 10-40 Mg  Tabs (Amlodipine-Olmesartan) .... Take 1 Tab By Mouth Every Day 4)  Omeprazole 20 Mg Tbec (Omeprazole) .... Take One Tab Once Daily 5)  Shakley Vitamin For Cholesterol .... Two Times A Day 6)  Acyclovir 400 Mg Tabs (Acyclovir) .... Take One Five Times A Day As Needed Cold Sores  Allergies (verified): 1)  ! Ace Inhibitors 2)  ! Doxycycline 3)  ! Lipitor  Physical Exam  General:   overweight male in no acute distress. HEENT exam atraumatic, normocephalic,  extraocular muscles are intact. Neck is supple. Chest clear to auscultation cardiac exam S1-S2 are regular. Abdominal exam overweight, and bowel sounds, soft. Gait is normal.   Impression & Recommendations:  Problem # 1:  HYPERLIPIDEMIA (ICD-272.4)  check labs today  Orders: Fingerstick ([address]) TLB-Lipid Panel (80061-LIPID) TLB-Hepatic/Liver Function Pnl (80076-HEPATIC)  Problem # 2:  HYPERTENSION (ICD-401.9)  controlled continue current medications  His updated medication list for this problem  includes:    Metoprolol Succinate 50 Mg Tb24 (Metoprolol succinate) .Marland Kitchen... 1 by mouth once daily    Azor 10-40 Mg Tabs (Amlodipine-olmesartan) .Marland Kitchen... Take 1 tab by mouth every day  BP today: 130/80 Prior BP: 162/92 (11/20/2009)  Labs Reviewed: K+: 3.8 (01/03/2009) Creat: : 1.0 (01/03/2009)   Chol: 198 (04/10/2009)   HDL: 44.10 (04/10/2009)   LDL: DEL (02/26/2008)   TG: 268.0 (04/10/2009)  Orders: TLB-BMP (Basic Metabolic Panel-BMET) (80048-METABOL)  Problem # 3:  GOUT (ICD-274.9)  no recurrence  Problem # 4:  ARTHRITIS, GENERALIZED (ICD-716.99)  Orders: Rheumatology Referral (Rheumatology)  Complete Medication List: 1)  Metoprolol Succinate 50 Mg Tb24 (Metoprolol succinate) .Marland Kitchen.. 1 by mouth once daily 2)  Aspirin 81 Mg Tabs (Aspirin) .... Once daily 3)  Azor 10-40 Mg Tabs (Amlodipine-olmesartan) .... Take 1 tab by mouth every day 4)  Omeprazole 20 Mg Tbec (Omeprazole) .... Take one tab once daily 5)  Shakley Vitamin For Cholesterol  .... Two times a day 6)  Acyclovir 400 Mg Tabs (Acyclovir) .... Take one five times a day as needed cold sores  Other Orders: Gastroenterology Referral (GI) Sleep Study (Sleep Study) Prescriptions: OMEPRAZOLE 20 MG TBEC (OMEPRAZOLE) take one tab once daily  #90 x 3   Entered and Authorized by:   Birdie Sons MD   Signed by:   Birdie Sons MD on 02/27/2010   Method used:   Faxed to ...       [facility] (  mail[facility])       [address] [place], [place]  [address]       Ph: [phone]       Fax: [phone]   RxID:   [id number] AZOR 10-40 MG  TABS (AMLODIPINE-OLMESARTAN) Take 1 tab by mouth every day  #90 x 3   Entered and Authorized by:   Birdie Sons MD   Signed by:   Birdie Sons MD on 02/27/2010   Method used:   Faxed to ...       [facility] (mail[facility])       [address] [place], [place]  [address]       Ph: [phone]       Fax: [phone]   RxID:   [id number] METOPROLOL SUCCINATE 50 MG  TB24 (METOPROLOL  SUCCINATE) 1 by mouth once daily  #90 x 3   Entered and Authorized by:   Birdie Sons MD   Signed by:   Birdie Sons MD on 02/27/2010   Method used:   Faxed to ...       [facility] (mail[facility])       [address] [place], [place]  [address]       Ph: [phone]       Fax: [phone]   RxID:   [id number]    Orders Added: 1)  Gastroenterology Referral [GI] 2)  Est. Patient Level IV [[address]] 3)  Sleep Study [Sleep Study] 4)  Fingerstick [364[address]] 5)  TLB-Lipid Panel [80061-LIPID] 6)  TLB-Hepatic/Liver Function Pnl [80076-HEPATIC] 7)  TLB-BMP (Basic Metabolic Panel-BMET) [80048-METABOL] 8)  Rheumatology Referral [Rheumatology]  Appended Document: Orders Update     Clinical Lists Changes  Orders: Added new Service order of Specimen Handling (607[address]) - Signed

## 2010-04-30 ENCOUNTER — Encounter: Payer: Self-pay | Admitting: Internal Medicine

## 2010-05-01 ENCOUNTER — Encounter: Payer: Self-pay | Admitting: Internal Medicine

## 2010-05-01 ENCOUNTER — Ambulatory Visit (INDEPENDENT_AMBULATORY_CARE_PROVIDER_SITE_OTHER): Payer: BC Managed Care – PPO | Admitting: Internal Medicine

## 2010-05-01 VITALS — BP 140/90 | HR 70 | Temp 98.1°F | Resp 18 | Ht 70.0 in | Wt 261.0 lb

## 2010-05-01 DIAGNOSIS — I10 Essential (primary) hypertension: Secondary | ICD-10-CM

## 2010-05-01 MED ORDER — AZITHROMYCIN 250 MG PO TABS: ORAL_TABLET | ORAL | Status: AC

## 2010-05-01 NOTE — Progress Notes (Signed)
  Subjective:    Patient ID: [name], male    DOB: [birth year], 65 y.o.   MRN: [id number]  HPI   65 year old patient who has a history of treated hypertension. For the past 4 days he has had chest congestion and productive cough. Cough is productive of green sputum. No fever shortness of breath or chest pain. For the past 2 days he has noted frequent episodes of wheezing. He is concerned about walking pneumonia he has had in the past. He is also a OSA  Suspect that has declined sleep studies in the past  Review of Systems  Constitutional: Negative for fever, chills, appetite change and fatigue.  HENT: Negative for hearing loss, ear pain, congestion, sore throat, trouble swallowing, neck stiffness, dental problem, voice change and tinnitus.   Eyes: Negative for pain, discharge and visual disturbance.  Respiratory: Positive for cough and wheezing. Negative for chest tightness and stridor.        [ Green sputum Cardiovascular: Negative for chest pain, palpitations and leg swelling.  Gastrointestinal: Negative for nausea, vomiting, abdominal pain, diarrhea, constipation, blood in stool and abdominal distention.  Genitourinary: Negative for urgency, hematuria, flank pain, discharge, difficulty urinating and genital sores.  Musculoskeletal: Negative for myalgias, back pain, joint swelling, arthralgias and gait problem.  Skin: Negative for rash.  Neurological: Negative for dizziness, syncope, speech difficulty, weakness, numbness and headaches.  Hematological: Negative for adenopathy. Does not bruise/bleed easily.  Psychiatric/Behavioral: Negative for behavioral problems and dysphoric mood. The patient is not nervous/anxious.        Objective:   Physical Exam  Constitutional: He is oriented to person, place, and time. He appears well-developed.        Weight 261. Repeat blood pressure 124/80  HENT:  Head: Normocephalic.  Right Ear: External ear normal.  Left Ear: External ear normal.    Eyes: Conjunctivae and EOM are normal.  Neck: Normal range of motion.  Cardiovascular: Normal rate and normal heart sounds.   Pulmonary/Chest: Breath sounds normal. No respiratory distress. He has no wheezes. He has no rales.        Lungs are essentially clear. O2 saturation 97%. Pulse rate 72  Abdominal: Bowel sounds are normal.  Musculoskeletal: Normal range of motion. He exhibits no edema and no tenderness.  Neurological: He is alert and oriented to person, place, and time.  Psychiatric: He has a normal mood and affect. His behavior is normal.          Assessment & Plan:   acute asthmatic bronchitis. We'll treat with azithromycin guaifenesin. Was also given Provera MDI. He'll call if unimproved

## 2010-05-01 NOTE — Patient Instructions (Signed)
Get plenty of rest, Drink lots of  clear liquids, and use Tylenol or ibuprofen for fever and discomfort.    Call or return to clinic prn if these symptoms worsen or fail to improve as anticipated.  Proair 2 puffs every 4-6 hours as needed for wheezing

## 2010-05-07 ENCOUNTER — Encounter (INDEPENDENT_AMBULATORY_CARE_PROVIDER_SITE_OTHER): Payer: Self-pay | Admitting: *Deleted

## 2010-05-14 NOTE — Letter (Signed)
Summary: Referral - not able to see patient  [facility]  [address]   [place], [place] [address]   Phone: [phone]  Fax: [phone]    May 07, 2010   Re:   [name] DOB:  [birth year] MRN:   [id number]    Dear Dr. Cato Mulligan:  Thank you for your kind referral of the above patient.  We have attempted to schedule the recommended procedure screening colonoscopy but have not been able to schedule because:  _x__ The patient was not available by phone and/or has not returned our calls.  ___ The patient declined to schedule the procedure at this time.  We appreciate the referral and hope that we will have the opportunity to treat this patient in the future.    Sincerely,    [facility] [phone]

## 2010-07-01 ENCOUNTER — Telehealth: Payer: Self-pay | Admitting: Internal Medicine

## 2010-07-01 NOTE — Telephone Encounter (Signed)
Pt needs to get in asap for medical clearance re: knee replacement with Dr Shelle Iron. Pt has a form that Dr Cato Mulligan to complete. Can pt bring this form by, or does pt need ov. Pt is under a time restraint.

## 2010-07-02 NOTE — Telephone Encounter (Signed)
Pt has been sch for ov and ekg on 07/10/10 at 11am, as noted.

## 2010-07-02 NOTE — Telephone Encounter (Signed)
Pt will need an OV, he needs an EKG and has not had one since 2010

## 2010-07-03 NOTE — Discharge Summary (Signed)
NAME:  [name], [name]                        ACCOUNT NO.:  [id number]   MEDICAL RECORD NO.:  [id number]                   PATIENT TYPE:  INP   LOCATION:  0361                                 [facility]AC[facility]  [facility]   PHYSICIAN:  Judie Petit T. Russella Dar, M.D. [facility]          DATE OF BIRTH:  09-03-1945   DATE OF ADMISSION:  06/04/2003  DATE OF DISCHARGE:  06/05/2003                                 DISCHARGE SUMMARY   ADMISSION DIAGNOSES:  68. A 65 year old white male with acute onset of rectal bleeding, 11 days     status post polypectomy of two rectal polyps.  His bleeding is consistent     with a post-polypectomy hemorrhage.  2. Recent history of rectal polyp with focal high-grade dysplasia.  3. Hypertension.  4. Hyperlipidemia.   DISCHARGE DIAGNOSES:  1. Stable, resolved, self-limited post-polypectomy bleed secondary to recent     two recent rectal polypectomies.  2. Mild anemia secondary to above.  3. Other diagnoses as listed above.   CONSULTATIONS:  None.   PROCEDURES:  None.   HISTORY:  [name] is a pleasant 65 year old white male known to Dr.  Leone Payor who had undergone colonoscopy initially in November 2004, at which  time he was found to have an adenomatous rectal polyp containing focal high-  grade dysplasia.  This was removed and he was planned for followup  sigmoidoscopy six months later to assess any residual tissue.  He underwent  flexible sigmoidoscopy on May 24, 2003, by Dr. Leone Payor, was found to have a  7 mm sessile polyp in the rectum which was biopsied and polypoid changes at  the prior polyp site removed with cold biopsy and then APC ablation.  Biopsies returned showing a hyperplastic polyp and an adenomatous polyp with  no evidence for dysplasia.  The patient had some minor bleeding for a day or  two after the procedure, and then had been doing well.  On the evening prior  to admission, he was on a business trip in [place] when he passed a  grossly bloody stool.  He  reports that he then have rectal bleeding of  bright red blood mixed with small amounts of stool about every hour all  night associated with some gassiness and rumbling, but no abdominal pain or  cramping.  He had no diaphoresis, nausea, vomiting, presyncope, etc.  He  said he continued with the bleeding all through the night and then into the  early morning hours and had his last episode around noontime, at which point  he was boarding a plane to come back to [place].  He had called and it  was suggested he may want to seek medical attention there, but he related  that he was coming back to [facility] and would go to the ER if necessary.  At this time, he is seen and evaluated in the emergency room, found to be  hemodynamically stable and comfortable, complaining of weakness,  but  otherwise no specific complaints.  He had not had any further bowel movement  since about noontime on the day of admission.  He denied any aspirin or  NSAID use.   LABORATORY DATA:  On admission, showed hemoglobin of 15.2, hematocrit of 44,  WBC of 8.2, platelet count within normal limits.  BMET was unremarkable.  Other labs:  Serial H&H were obtained.  Later that evening his hemoglobin  dropped to 11.7, the following morning was stable at 12.9, and was checked  again in the afternoon of June 05, 2003, and his hemoglobin was 13.7,  hematocrit of 39.8.  Coags were normal on admission with a prothrombin time  of 12.6, INR of 0.9, PTT of 39.   X-ray studies:  None.   HOSPITAL COURSE:  The patient was admitted to the service of Dr. Claudette Head who was covering the hospital.  He was placed on IV fluids and bowel  rest with a clear liquid diet.  He was encouraged to stay at bed rest and  serial H&H were obtained.  Fortunately, he was stable at the time of  admission and his hemoglobin was in the normal range.  He did well  overnight, did not have any further episodes of rectal bleeding, and the  following  morning was feeling fine.  His hemoglobin did drop and then  stabilized in the 13 range.  We advanced his diet on the morning of June 05, 2003, and encouraged him to ambulate which he did.  He stayed in the  hospital until that afternoon.  His hemoglobin remained stable, and again no  further bleeding.  He was allowed discharge to home that afternoon in a  stable and improved condition.  He was asked to stay out of work and at home  resting for the next 48 hours.  He and his wife related that they had a  vacation planned in [facility] for four days after that.  He was advised  that this would be fine, but no strenuous exercise, heavy lifting, etc.  He  was asked to call our office should he have any further evidence of active  bleeding, and was made a follow up appointment with Dr. Leone Payor in the  office on Friday, June 14, 2003.  He was to stay on a full liquid diet  through June 07, 2003, and then advance to a regular diet thereafter.   DISCHARGE MEDICATIONS:  1. No aspirin, Advil, Aleve, NSAIDS, etc.  2. He was to resume his Monopril and Lipitor at previous doses.  No other     new medications.     Amy Esterwood, P.A.-C. [facility]                Malcolm T. Russella Dar, M.D. [facility]    AE/MEDQ  D:  06/06/2003  T:  06/06/2003  Job:  [id number]   cc:   Valetta Mole. Swords, M.D. [facility]

## 2010-07-03 NOTE — Assessment & Plan Note (Signed)
[facility]                                 ON-CALL NOTE   NAME:Dromgoole, Unique                       MRN:          [id number]  DATE:02/26/2006                            DOB:          10-24-1945    Mrs. Salce called in today stating that Mr. Routon was diagnosed with  possible gout on February 10, 2006.  He was given prednisone.  She  states that he is having trouble walking secondary to the pain.  Given  that the patient has been having discomfort for almost 3 weeks and  symptoms appeared to be worsening,  it was recommended that he be seen  in the emergency department.     Leanne Chang, M.D.  Electronically Signed    LA/MedQ  DD: 02/26/2006  DT: 02/27/2006  Job #: [id number]

## 2010-07-03 NOTE — H&P (Signed)
NAME:  ADETOKUNBO, MCCADDEN                        ACCOUNT NO.:  [id number]   MEDICAL RECORD NO.:  [id number]                   PATIENT TYPE:  EMS   LOCATION:  ED                                   FACILITY:  [facility]   PHYSICIAN:  Malcolm T. Russella Dar, M.D. [facility]          DATE OF BIRTH:  October 12, 1945   DATE OF ADMISSION:  06/04/2003  DATE OF DISCHARGE:                                HISTORY & PHYSICAL   CHIEF COMPLAINT:  Rectal bleeding since last evening.   HISTORY OF PRESENT ILLNESS:  Mr. Olivero is a pleasant 65 year old white  male known to Dr. Leone Payor with history of adenomatous rectal polyp with  focal high grade dysplasia which was removed in November of 2004 at  colonoscopy.  He underwent follow-up flexible sigmoidoscopy May 24, 2003,  with finding of a 7 mm sessile polyp in a rectum which was biopsied and  polypoid changes at the prior polyp site removed with cold biopsy and then  APC ablation.  Biopsies returned showing a hyperplastic polyp and an  adenomatous polyp with no evidence for dysplasia.   The patient reports that he did have some minor bleeding for a couple of  days after the procedure and then was doing well until last evening after he  got into [place] for a business trip.  He passed a grossly bloody stool  and then reports rectal bleeding about every hour all night with bright red  blood mixed with a small amount of stool.  He did have some rumbling but no  real cramping or abdominal pain.  He says this continued through this  morning with his last episode of bleeding around noon.  He called our office  and related that he was on his way back to [facility] and would go to the ER  on arrival.  At this time he does complain of some weakness, but no  dizziness, diaphoresis, shortness of breath, etc.  He says he has not felt  very hungry today and only ate oatmeal.  He is seen in the ER now, found to  be hemodynamically stable.  There is no further active bleeding in the  ER.  WBC is 8.2, hemoglobin 15.2, hematocrit of 44.  BMET is unremarkable.  He is  admitted for observation overnight with post polypectomy hemorrhage.  He  says he has not used any aspirin or NSAIDS since his procedure.   CURRENT MEDICATIONS:  1. Monopril 20 mg daily.  2. Lipitor 10 daily.  3. Multivitamin B complex daily.  4. Vitamin C daily.   ALLERGIES:  No known drug allergies.   PAST MEDICAL HISTORY:  1. Pertinent for hypertension.  2. Hyperlipidemia.  3. Colon polyps.   FAMILY HISTORY:  Negative for colon cancer polyps.   SOCIAL HISTORY:  The patient is married.  He is a businessman.  He is a  nonsmoker.  No regular ETOH.   REVIEW OF SYSTEMS:  Reviewed and  completely negative other than outlined  above.   PHYSICAL EXAMINATION:  GENERAL:  Well-developed white male in no acute  distress.  He is alert and oriented x3.  VITAL SIGNS:  Temperature is 98.3, blood pressure 148/103, pulse is 87,  respirations 18.  Saturation is 98 on room air.  HEENT:  Nontraumatic, normocephalic.  EOMI.  PERRLA.  Sclerae anicteric.  NECK:  Supple without nodes.  CARDIOVASCULAR:  Regular rate and rhythm with S1 and S2.  PULMONARY:  Clear to A&P.  ABDOMEN:  Soft.  Bowel sounds are active.  He is minimally tender in the  left lower quadrant.  There is no guarding or rebound.  No mass or  hepatosplenomegaly.  RECTAL:  A small amount of gross blood per the ER, not repeated.  EXTREMITIES:  Without clubbing, cyanosis, or edema.   IMPRESSION:  66. A 65 year old white male with acute post polypectomy bleed, 65 days     status post polypectomy of two rectal polyps.  2. Recent history of rectal polyp with focal high grade dysplasia.  3. Hypertension.  4. Hyperlipidemia.   PLAN:  The patient is admitted overnight for observation, serial H&H's,  transfusions as indicated, which hopefully will  not be necessary.  He will  be placed at bowel rest.  Hopefully at this point his bleed is resolving and   will be self limited.  If he does have evidence for persistent bleeding,  would reflex with attempt to cauterize or clip the bleeding site.  For  details please see the orders.     Amy Esterwood, P.A.-C. [facility]                Malcolm T. Russella Dar, M.D. [facility]    AE/MEDQ  D:  06/04/2003  T:  06/04/2003  Job:  [id number]   cc:   Venita Lick. Russella Dar, M.D. [facility]   Bruce H. Swords, M.D. [facility]

## 2010-07-07 ENCOUNTER — Ambulatory Visit (INDEPENDENT_AMBULATORY_CARE_PROVIDER_SITE_OTHER): Payer: BC Managed Care – PPO | Admitting: Internal Medicine

## 2010-07-07 ENCOUNTER — Encounter: Payer: Self-pay | Admitting: Internal Medicine

## 2010-07-07 VITALS — BP 134/80 | HR 76 | Temp 98.6°F | Ht 70.0 in | Wt 252.0 lb

## 2010-07-07 DIAGNOSIS — Z0000 Encounter for general adult medical examination without abnormal findings: Secondary | ICD-10-CM

## 2010-07-07 DIAGNOSIS — I10 Essential (primary) hypertension: Secondary | ICD-10-CM

## 2010-07-07 DIAGNOSIS — E785 Hyperlipidemia, unspecified: Secondary | ICD-10-CM

## 2010-07-07 LAB — CBC WITH DIFFERENTIAL/PLATELET
Eosinophils Relative: 2.1 % (ref 0.0–5.0)
HCT: 47.5 % (ref 39.0–52.0)
Lymphocytes Relative: 19.9 % (ref 12.0–46.0)
MCV: 94.5 fl (ref 78.0–100.0)
Monocytes Relative: 10.5 % (ref 3.0–12.0)
Neutrophils Relative %: 67.1 % (ref 43.0–77.0)
Platelets: 213 10*3/uL (ref 150.0–400.0)
WBC: 5.7 10*3/uL (ref 4.5–10.5)

## 2010-07-07 LAB — TSH: TSH: 5.39 u[IU]/mL (ref 0.35–5.50)

## 2010-07-07 MED ORDER — NITROGLYCERIN 0.4 MG SL SUBL: 0.4000 mg | SUBLINGUAL_TABLET | SUBLINGUAL | Status: DC | PRN

## 2010-07-07 NOTE — Assessment & Plan Note (Signed)
Previous control

## 2010-07-07 NOTE — Progress Notes (Signed)
  Subjective:    Patient ID: [name], male    DOB: [birth year], 65 y.o.   MRN: [id number]  HPI  Referred for medication clearance Most recent surgery---arthroscopy---no trouble with general anesthesia He denies all cardiopulmonary complaints. States that he carries NTG with him----never takes it and never diagnosed with CAD.   HTN---tolerating meds  Lipids---not on any meds  Past Medical History  Diagnosis Date  . Colon polyp, hyperplastic   . Gout   . HTN (hypertension)   . GI bleed 2005    after colonoscopy  . Hyperlipidemia    Past Surgical History  Procedure Date  . Tumor removal 1984    from Jaw  . Knee arthroscopy 2011    Dr. Shelle Iron    reports that he quit smoking about 42 years ago. He does not have any smokeless tobacco history on file. He reports that he drinks about 5 ounces of alcohol per week. He reports that he does not use illicit drugs. family history includes Diabetes in his mother and Stroke in his mother. Allergies  Allergen Reactions  . Ace Inhibitors   . Atorvastatin     REACTION: breast tenderness  . Doxycycline     REACTION: stomach cramping. flatus   .  Review of Systems  patient denies chest pain, shortness of breath, orthopnea. Denies lower extremity edema, abdominal pain, change in appetite, change in bowel movements. Patient denies rashes, musculoskeletal complaints. No other specific complaints in a complete review of systems.      Objective:   Physical Exam  well-developed well-nourished male in no acute distress. HEENT exam atraumatic, normocephalic, neck supple without jugular venous distention. Chest clear to auscultation cardiac exam S1-S2 are regular. Abdominal exam overweight with bowel sounds, soft and nontender. Extremities no edema. Neurologic exam is alert with a normal gait.        Assessment & Plan:

## 2010-07-07 NOTE — Assessment & Plan Note (Signed)
Adequate control. 

## 2010-07-10 ENCOUNTER — Ambulatory Visit: Payer: BC Managed Care – PPO | Admitting: Internal Medicine

## 2010-07-27 ENCOUNTER — Other Ambulatory Visit: Payer: Self-pay | Admitting: Specialist

## 2010-07-27 ENCOUNTER — Ambulatory Visit (HOSPITAL_COMMUNITY)
Admission: RE | Admit: 2010-07-27 | Discharge: 2010-07-27 | Disposition: A | Discharge status: Home / Self Care | Payer: BC Managed Care – PPO | Source: Ambulatory Visit | Attending: Specialist | Admitting: Specialist

## 2010-07-27 ENCOUNTER — Encounter (HOSPITAL_COMMUNITY): Payer: BC Managed Care – PPO

## 2010-07-27 ENCOUNTER — Other Ambulatory Visit: Payer: Self-pay | Admitting: Internal Medicine

## 2010-07-27 ENCOUNTER — Other Ambulatory Visit (HOSPITAL_COMMUNITY): Payer: Self-pay | Admitting: Specialist

## 2010-07-27 DIAGNOSIS — M25469 Effusion, unspecified knee: Secondary | ICD-10-CM | POA: Insufficient documentation

## 2010-07-27 DIAGNOSIS — Z01818 Encounter for other preprocedural examination: Secondary | ICD-10-CM

## 2010-07-27 DIAGNOSIS — Z01812 Encounter for preprocedural laboratory examination: Secondary | ICD-10-CM | POA: Insufficient documentation

## 2010-07-27 LAB — COMPREHENSIVE METABOLIC PANEL
AST: 18 U/L (ref 0–37)
Albumin: 3.7 g/dL (ref 3.5–5.2)
Alkaline Phosphatase: 85 U/L (ref 39–117)
Calcium: 9.1 mg/dL (ref 8.4–10.5)
Chloride: 101 mEq/L (ref 96–112)
Creatinine, Ser: 1.03 mg/dL (ref 0.4–1.5)
GFR calc Af Amer: >60 mL/min (ref >60)
Potassium: 4.3 mEq/L (ref 3.5–5.1)
Sodium: 138 mEq/L (ref 135–145)
Total Protein: 6.4 g/dL (ref 6.0–8.3)

## 2010-07-27 LAB — URINALYSIS, ROUTINE W REFLEX MICROSCOPIC
Glucose, UA: NEGATIVE mg/dL
Hgb urine dipstick: NEGATIVE
Nitrite: NEGATIVE

## 2010-07-27 LAB — CBC
Hemoglobin: 16.1 g/dL (ref 13.0–17.0)
MCHC: 33.9 g/dL (ref 30.0–36.0)
MCV: 90.6 fL (ref 78.0–100.0)
RBC: 5.24 MIL/uL (ref 4.22–5.81)
RDW: 12.8 % (ref 11.5–15.5)
WBC: 6 10*3/uL (ref 4.0–10.5)

## 2010-07-27 LAB — DIFFERENTIAL
Basophils Relative: 0 % (ref 0–1)
Eosinophils Absolute: 0.2 10*3/uL (ref 0.0–0.7)
Lymphs Abs: 1.2 10*3/uL (ref 0.7–4.0)

## 2010-07-27 LAB — URINE MICROSCOPIC-ADD ON

## 2010-07-27 LAB — APTT: aPTT: 41 seconds — ABNORMAL HIGH (ref 24–37)

## 2010-07-27 LAB — SURGICAL PCR SCREEN: Staphylococcus aureus: NEGATIVE

## 2010-07-27 LAB — PROTIME-INR
INR: 1 (ref 0.00–1.49)
Prothrombin Time: 13.4 seconds (ref 11.6–15.2)

## 2010-08-06 ENCOUNTER — Inpatient Hospital Stay (HOSPITAL_COMMUNITY): Payer: BC Managed Care – PPO

## 2010-08-06 ENCOUNTER — Inpatient Hospital Stay (HOSPITAL_COMMUNITY)
Admission: RE | Admit: 2010-08-06 | Discharge: 2010-08-09 | DRG: 209 | Disposition: A | Discharge status: Home Health | Payer: BC Managed Care – PPO | Source: Ambulatory Visit | Attending: Specialist | Admitting: Specialist

## 2010-08-06 DIAGNOSIS — K449 Diaphragmatic hernia without obstruction or gangrene: Secondary | ICD-10-CM | POA: Diagnosis present

## 2010-08-06 DIAGNOSIS — E669 Obesity, unspecified: Secondary | ICD-10-CM | POA: Diagnosis present

## 2010-08-06 DIAGNOSIS — M1710 Unilateral primary osteoarthritis, unspecified knee: Principal | ICD-10-CM | POA: Diagnosis present

## 2010-08-06 DIAGNOSIS — Z01812 Encounter for preprocedural laboratory examination: Secondary | ICD-10-CM

## 2010-08-06 DIAGNOSIS — I10 Essential (primary) hypertension: Secondary | ICD-10-CM | POA: Diagnosis present

## 2010-08-06 LAB — TYPE AND SCREEN: ABO/RH(D): O POS

## 2010-08-06 LAB — ABO/RH: ABO/RH(D): O POS

## 2010-08-07 LAB — CBC
MCHC: 34 g/dL (ref 30.0–36.0)
RDW: 12.8 % (ref 11.5–15.5)
WBC: 11.8 10*3/uL — ABNORMAL HIGH (ref 4.0–10.5)

## 2010-08-07 LAB — BASIC METABOLIC PANEL
Chloride: 100 mEq/L (ref 96–112)
GFR calc Af Amer: >60 mL/min (ref >60)
GFR calc non Af Amer: >60 mL/min (ref >60)
Potassium: 3.8 mEq/L (ref 3.5–5.1)
Sodium: 134 mEq/L — ABNORMAL LOW (ref 135–145)

## 2010-08-07 NOTE — Op Note (Signed)
NAMECORNELLIUS, KROPP              ACCOUNT NO.:  [id number]  MEDICAL RECORD NO.:  [id number]  LOCATION:  0003                         FACILITY:  [facility]  PHYSICIAN:  Jene Every, M.D.    DATE OF BIRTH:  02/04/1946  DATE OF PROCEDURE:  08/06/2010 DATE OF DISCHARGE:                              OPERATIVE REPORT   PREOPERATIVE DIAGNOSIS:  Degenerative joint disease with varus deformity of the right knee.  POSTOPERATIVE DIAGNOSIS:  Degenerative joint disease with varus deformity of the right knee.  PROCEDURE PERFORMED:  Right total knee arthroplasty utilizing DePuy rotating platform, 5 femur, 4 tibia, 10-mm insert, 38 patella.  ANESTHESIA:  General.  ASSISTANT:  Roma Schanz, PA  BRIEF HISTORY:  This is a 65 year old male with end-stage osteoarthrosis particularly medial compartment, patellofemoral joint of the right knee prior to  conservative treatment disabling knee pain, was indicated for total knee arthroplasty with the risks and benefits including bleeding, infection, damage to neurovascular structures, DVT, PE, suboptimal range of motion, arthrofibrosis, need for revision, need for manipulation, anesthetic complications etc.  TECHNIQUE:  The patient in supine position.  After induction of adequate general anesthesia and 2 g of Kefzol, the right lower extremity was prepped and draped and exsanguinated in the usual sterile fashion. Thigh tourniquet insufflated to 300 mmHg.  The patient has slight flexion contracture about 3 to 5 degrees.  Midline incision was made over the anterior aspect of the knee.  Full-thickness flaps were developed.  He had a fair amount of fibrotic tissue.  Median parapatellar arthrotomy was performed.  We elevated soft tissue medially around the medial aspect of the proximal tibial plateau utilizing a curved cryo preserving medial collateral ligament.  Knee flexed and the patella everted.  We removed the remnants of the medial and  lateral menisci and ACL.  Tricompartmental osteoarthrosis was noted particularly of the medial compartment.  Fibrosis from previous arthroscopic surgery was noted as well.  We used a rongeur to remove all osteophytes and identify the anterior aspect of the femur.  Step drill was utilized to enter the femoral canal.  This was then irrigated.  A T-handled reamer was inserted, 5 degrees to the right was utilized.  Due to slight flexion contracture, we selected 11 mm off the distal femur after pinning the distal femoral block removed from the distal femoral cut. Next, we used the femoral sizing guide off the anterior cortex to 5. This was pinned with 3 degrees of external rotation.  We used a distal femoral cutting block, secured, and anterior-posterior chamfer cuts were then performed.  Attention was turned towards the tibia.  Tibia subluxed, Kai Levins was utilized to protect soft tissues at all times.  The remnants of the medial and lateral menisci were then further removed. Osteophytes were removed from the proximal tibia.  All loose bodies posteriorly were identified and removed.  We selected an external alignment guide bisecting the ankle parallel to the tibia, then off the high side which was laterally.  Proximal tibial cut was then performed. We then tested the flexion/extension gaps with blocks.  Slight tightness in both flexion/extension, therefore we removed additional 2 mm off the proximal tibia.  Flexion/extension blocks were then equivalent  with good stability and satisfactory alignment.  Attention was then turned towards completing the tibia with the tibia subluxed and Michel placed, we sized the proximal tibia between 5 and 4, 4 fit optimally, with essentially full coverage.  Osteophytes were removed with a rongeur just to the medial third of the tibial tubercle.  This was then pinned and central drill utilized and pinned, punch guide utilized as well.  Attention was then turned  back towards the femur.  Bisected the notch with the distal femoral block cutting guide applied.  Oscillating saw performed block cut.  We then placed a trial femur and a 10-mm insert.  We had full extension, full flexion, good stability with varus and valgus stress testing at 0 and 30 degrees.  We therefore turned our attention to the patella, it was everted, measured at a 27, 38 sizing, therefore selected an 18, used a patellar planing clamp and performed our patellar cut.  We then used our clamp to drill our peg holes slightly medializing the patella.  Trial patella placed.  We had excellent patellofemoral tracking with flexion and extension.  All trials were then removed.  We checked posteriorly.  Again any remnants of the menisci were removed and geniculate vessels cauterized.  Any residual bone removed as well.  Bone fragments were removed as well.  Used pulsatile lavage, cleaned the bony surfaces and the soft tissues at low pressure.  Cement was then mixed on the back table in the appropriate fashion.  Knee was flexed, all surfaces dried.  The tibia subluxed, Kai Levins was utilized __________ under pressure the cement in the proximal tibia digitally pressurized the cement and impacted our tibial tray #4.  Redundant cement removed. In similar fashion, we repaired the surface of the surface and the bone, injecting cement, digitally pressurizing and impacting the femoral component.  Redundant cement removed.  Placed a trial 10 and tagged, reduced it.  Axial load applied and extension throughout the curing of cement with redundant cement removed.  Patellar component was then cemented after preparing the patella surfaces well.  Next, after appropriate curing of cement, we further examined the inserts with full extension, full flexion.  Good stability at varus and valgus stress testing at 0 and 30 degrees.  Negative anterior drawer and excellent patellofemoral tracking.  Therefore, flexing  the knee removed the trial 10, inspected posteriorly.  Redundant cement removed.  Copiously irrigated the soft tissue with low-pressure pulsatile lavage.  Then inserted a 10-mm permanent rotating platform polyethylene insert. Again, tested stability and it was satisfactory with extension, flexion, and full and good stability with varus and valgus stress testing at 0 and 30 degrees and excellent patellofemoral tracking.  A Hemovac was placed and brought out through lateral stab wound on the skin.  Marcaine with epinephrine was utilized to infiltrate the capsule of the soft tissues of the femoral condyle.  Marcaine with epinephrine was injected into the knee.  Wound was placed in slight flexion.  The patella was centered and repaired and adequate tension of the quadriceps and patellar tendon.  This was with #1 Vicryl interrupted figure-of-eight sutures.  I then tested the range of motion following that and he had excellent patellofemoral tracking without abnormality.  Flexion to 90 degrees was noted by gravity.  Wound irrigated copiously and I closed the deep tissue with 2-0 Vicryl simple sutures.  Skin was reapproximated with staples, wound was dressed sterilely.  Placed in a Jones dressing, Ace bandages.  Tourniquet was deflated and there was  adequate revascularization of lower extremity appreciated.  The patient tolerated the procedure well, no complications.  Tourniquet time was 1 hour and 55 minutes.  There were no intraoperative complication noted.  The patient tolerated the procedure well, no complication, was sent to recovery room in satisfactory condition.     Jene Every, M.D.     Cordelia Pen  D:  08/06/2010  T:  08/06/2010  Job:  [id number]  Electronically Signed by Jene Every M.D. on 08/07/2010 02:49:51 PM

## 2010-08-08 LAB — BASIC METABOLIC PANEL
Calcium: 9.2 mg/dL (ref 8.4–10.5)
GFR calc Af Amer: >60 mL/min (ref >60)
GFR calc non Af Amer: >60 mL/min (ref >60)
Glucose, Bld: 135 mg/dL — ABNORMAL HIGH (ref 70–99)
Potassium: 4.1 mEq/L (ref 3.5–5.1)
Sodium: 136 mEq/L (ref 135–145)

## 2010-08-08 LAB — CBC
Hemoglobin: 14.9 g/dL (ref 13.0–17.0)
Platelets: 193 10*3/uL (ref 150–400)
RBC: 4.81 MIL/uL (ref 4.22–5.81)
WBC: 12.3 10*3/uL — ABNORMAL HIGH (ref 4.0–10.5)

## 2010-08-09 LAB — CBC
HCT: 43.3 % (ref 39.0–52.0)
Hemoglobin: 14.6 g/dL (ref 13.0–17.0)
MCV: 91.7 fL (ref 78.0–100.0)
RBC: 4.72 MIL/uL (ref 4.22–5.81)
WBC: 10.7 10*3/uL — ABNORMAL HIGH (ref 4.0–10.5)

## 2010-08-09 LAB — BASIC METABOLIC PANEL
BUN: 12 mg/dL (ref 6–23)
CO2: 28 mEq/L (ref 19–32)
Chloride: 99 mEq/L (ref 96–112)
Creatinine, Ser: 0.83 mg/dL (ref 0.50–1.35)
GFR calc Af Amer: >60 mL/min (ref >60)
Potassium: 3.5 mEq/L (ref 3.5–5.1)

## 2010-08-15 NOTE — H&P (Signed)
NAME:  BIRD, SWETZ              ACCOUNT NO.:  [id number]  MEDICAL RECORD NO.:  LOCATION:                                 FACILITY:  PHYSICIAN:  Jene Every, M.D.         DATE OF BIRTH:  DATE OF ADMISSION:  08/06/2010 DATE OF DISCHARGE:                             HISTORY & PHYSICAL   PRIMARY CARE PHYSICIAN:  Valetta Mole. Swords, M.D.  CHIEF COMPLAINT:  Includes right knee pain.  HISTORY OF PRESENT ILLNESS:  Mr. Zola Button is a pleasant 65 year old gentleman with ongoing right knee pain that has become fairly disabling for him.  He has undergone injections with only short-term relief of symptoms.  Radiographs do reveal progressive end-stage osteoarthritis of the medial compartment.  He does have a remote history of knee arthroscopy.  Unfortunately, again the patient has disabling symptoms with loss of range of motion, so at this time, he would benefit from a total knee arthroplasty.  Risks and benefits of the surgery were discussed with the patient.  He does wish to proceed.  PAST MEDICAL HISTORY: 1. Hypertension. 2. Hyperlipidemia. 3. GERD.  CURRENT MEDICATIONS:  Include: 1. Azor 10-40 mg. 2. Omeprazole 20 mg. 3. Metoprolol ER 50 mg. 4. Acyclovir 400 mg tablet p.r.n., cold sores.  ALLERGIES:  Patient is intolerant to: 1. LIPITOR 2. ACE INHIBITORS.  PAST SURGICAL HISTORY: 1. Partial removal of jaw bones secondary to cancer. 2. Right knee arthroscopy done in 2011.  SOCIAL HISTORY:  The patient is married.  Previous smoker, approximately 45 years ago.  He does occasionally drink wine at dinner.  He has two children.  Family will be caregivers following surgery.  He lives in a two-story home.  FAMILY HISTORY:  Father deceased at 29 from CVA.  Mother deceased at 13 from renal failure.  REVIEW OF SYSTEMS:  GENERAL:  The patient denies any fever, chills, night sweats or bleeding tendencies.  CNS:  No blurred or double vision, seizure, headache or paralysis.   RESPIRATORY:  No shortness of breath, productive cough or hemoptysis.  CARDIOVASCULAR:  No chest pain, orthopnea.  GU:  No dysuria, hematuria, discharge.  GI:  No nausea, vomiting, diarrhea, constipation, melena.  MUSCULOSKELETAL:  As per HPI.  PHYSICAL EXAMINATION:  VITAL SIGNS:  Height is 5 feet 11 inches, weight is 250.  Pulse is 60, respiratory rate 10, BP 150/90. GENERAL:  This is slightly overweight gentleman seen upright, in no acute distress.  He does walk with slight antalgic gait utilizing a cane. HEENT:  Atraumatic, normocephalic.  Pupils are equal, round and reactive to light.  EOMs intact. NECK:  Supple.  No lymphadenopathy. CHEST:  Clear to auscultation bilaterally. HEART:  Regular rate and rhythm without murmurs, gallops or rubs. ABDOMEN:  Soft, nontender, protuberant.  Bowel sounds x4. SKIN:  No rashes or lesions are noted. EXTREMITIES:  In regard to the knee, there is trace effusion noted.  He is tender along medial compartment.  He does have pain with patellofemoral compression.  Range of motion is -5 to 120 degrees.  IMPRESSION:  Degenerative joint disease, right knee.  PLAN:  The patient will be admitted to undergo a right total knee arthroplasty.  Roma Schanz, P.A.   ______________________________ Jene Every, M.D.    CS/MEDQ  D:  07/30/2010  T:  07/30/2010  Job:  [id number]  Electronically Signed by Roma Schanz P.A. on 08/14/2010 12:01:20 PM Electronically Signed by Jene Every M.D. on 08/15/2010 09:05:37 AM

## 2010-08-15 NOTE — Op Note (Signed)
  NAMEBRAXXTON, [name]              ACCOUNT NO.:  [id number]  MEDICAL RECORD NO.:  [id number]  LOCATION:                                 FACILITY:  PHYSICIAN:  Jene Every, M.D.    DATE OF BIRTH:  05-Nov-1945  DATE OF PROCEDURE:  08/06/2010 DATE OF DISCHARGE:                              OPERATIVE REPORT   ADDENDUM: Addendum to the operative report on total knee.  The tibial component was trialed.  We trialed a 4 and a 5 tibial tray.  The 5 tibial tray had significant overhang posteriorly both medially and laterally.  We trialed a 4, there was no overhang, came up to the edge of the bone on the lateral part and on the medial part up to the edge of the bone of the tibia, tibial cortex anteriorly, posteriorly there was just slight area laterally and slight area medially approximately at 3 o'clock with 1 mm or 2 of bone beyond the prosthesis.  This size fit optimally in terms of the knee replacement.  Also we selected the 4 as opposed to the 5.     Jene Every, M.D.     Cordelia Pen  D:  08/08/2010  T:  08/08/2010  Job:  [id number]  Electronically Signed by Jene Every M.D. on 08/15/2010 09:05:09 AM

## 2010-08-25 ENCOUNTER — Telehealth: Payer: Self-pay | Admitting: *Deleted

## 2010-08-25 NOTE — Telephone Encounter (Signed)
Fingers are tingling along with toes.  Knee replacement 3 weeks ago.  Is doing PT, and is on Xarelto.  BP 130/65.  Is taking muscle relaxers, and pain meds also.  Will call his knee surgeon, and if they need Primary Care, will call us back.

## 2010-08-26 NOTE — Telephone Encounter (Signed)
Did not call back.

## 2010-09-13 NOTE — Discharge Summary (Signed)
[name], [name]              ACCOUNT NO.:  [id number]  MEDICAL RECORD NO.:  [id number]  LOCATION:  1619                         FACILITY:  [facility]  PHYSICIAN:  Jene Every, M.D.    DATE OF BIRTH:  Mar 08, 1945  DATE OF ADMISSION:  08/06/2010 DATE OF DISCHARGE:  08/09/2010                              DISCHARGE SUMMARY   ADMISSION DIAGNOSES: 1. Degenerative joint disease, right knee. 2. Hypertension. 3. Hyperlipidemia. 4. Gastroesophageal reflux disease.  DISCHARGE DIAGNOSES: 1. Degenerative joint disease, right knee. 2. Hypertension. 3. Hyperlipidemia. 4. Gastroesophageal reflux disease. 5. Status post right total knee arthroplasty.  HISTORY:  [name] is a pleasant 65 year old gentleman with long- standing history of right knee pain.  He has undergone multiple injections with short-term relief of symptoms.  Radiographs do reveal end-stage osteoarthritis of the medial compartment.  He had a remote history of knee arthroscopy.  Unfortunately, the patient has disabling symptoms.  It is felt that at this time he would benefit from a total knee arthroplasty.  The risks and benefits of the surgery were discussed with the patient.  He does wish to proceed.  PROCEDURE:  The patient was taken to the OR and underwent a right total knee arthroplasty.  SURGEON:  Jene Every, M.D.  ASSISTANT:  Roma Schanz, P.A.  ANESTHESIA:  General.  COMPLICATIONS:  None.  LABORATORY DATA:  Admission labs showed white cell count 11.8, hemoglobin 14.5, hematocrit 42.6.  This was monitored throughout the hospital course.  White cell count normalized to 10.7 at discharge. Hemoglobin was stable at 14.6, hematocrit 43.3.  Routine chemistries showed sodium 134, potassium 3.8 with a normal BUN and creatinine, slightly elevated glucose of 145 and this was marked throughout the hospital course.  Serum potassium remained within normal range.  Glucose slightly elevated.  GFR is greater  than 60.  Blood type is O positive. Preop chest x-ray is scanned in the chart.  HOSPITAL COURSE:  The patient was admitted, taken to the OR and underwent the above-stated procedure without difficulty.  He was then transferred to the PACU and then to the orthopedic floor for continued postoperative care.  When he moved back, drain was placed intraoperatively.  The patient was placed on PCA for pain relief. Postoperative day #1, the patient was doing well.  Pain was well- controlled.  Denies any chest pain, shortness of breath.  Vital signs were stable.  He had good p.o. intake.  Fluids were KVO'd.  The drain was removed.  The patient was started on Xarelto for DVT prophylaxis. Postop day #2, dressing was changed.  Incision was clean, dry and intact.  The patient was voiding without difficulty.  Mild constipation was noted.  He did have bowel sounds and was passing flatus.  PT/OT was continued.  Postoperative day #3, the patient was doing very well. Vital signs stable and was afebrile.  Incision remained clean, dry and intact.  He met all goals of physical therapy.  It is felt at this point he was stable to be discharged home.  DISPOSITION:  The patient discharged home with all home health needs met.  He is to follow with Dr. Shelle Iron in approximately 10 to 14 days for  suture removal and x-ray.  ACTIVITIES:  Ambulate as tolerated, utilizing knee immobilizer, doing straight leg raise x10.  Continue with ice up to 6 times a day, 20 minutes time for edema control.  Medications as per med rec sheet.  CONDITION ON DISCHARGE:  Stable.  FINAL DIAGNOSIS:  Doing well status post right total knee arthroplasty.     Roma Schanz, P.A.   ______________________________ Jene Every, M.D.    CS/MEDQ  D:  08/31/2010  T:  08/31/2010  Job:  [id number]  Electronically Signed by Roma Schanz P.A. on 09/10/2010 02:59:36 PM Electronically Signed by Jene Every M.D. on 09/13/2010 09:49:55  AM

## 2011-04-01 ENCOUNTER — Telehealth: Payer: Self-pay | Admitting: Internal Medicine

## 2011-04-01 NOTE — Telephone Encounter (Signed)
Pt called and said that he is going to needs refills on meds to last until his cpx schd 04/29/11. Pls write the following scripts and pt will pick them up tomorrow when pt come in for cpx labs. Pt needs metoprolol (TOPROL-XL) 50 MG 24 hr tablet, amLODipine-olmesartan (AZOR) 10-40 MG per tablet,omeprazole (PRILOSEC) 20 MG capsule. Pls notify pt when written scripts are ready for pick up.

## 2011-04-02 ENCOUNTER — Other Ambulatory Visit (INDEPENDENT_AMBULATORY_CARE_PROVIDER_SITE_OTHER): Payer: BC Managed Care – PPO

## 2011-04-02 DIAGNOSIS — Z0000 Encounter for general adult medical examination without abnormal findings: Secondary | ICD-10-CM

## 2011-04-02 LAB — BASIC METABOLIC PANEL
CO2: 28 mEq/L (ref 19–32)
Calcium: 8.8 mg/dL (ref 8.4–10.5)
Chloride: 106 mEq/L (ref 96–112)
Creatinine, Ser: 1 mg/dL (ref 0.4–1.5)
Glucose, Bld: 110 mg/dL — ABNORMAL HIGH (ref 70–99)

## 2011-04-02 LAB — CBC WITH DIFFERENTIAL/PLATELET
Basophils Absolute: 0 10*3/uL (ref 0.0–0.1)
Basophils Relative: 0.6 % (ref 0.0–3.0)
Eosinophils Absolute: 0.3 10*3/uL (ref 0.0–0.7)
Hemoglobin: 16.1 g/dL (ref 13.0–17.0)
Lymphocytes Relative: 27.2 % (ref 12.0–46.0)
MCHC: 33.8 g/dL (ref 30.0–36.0)
MCV: 92.7 fl (ref 78.0–100.0)
Monocytes Absolute: 0.5 10*3/uL (ref 0.1–1.0)
Neutro Abs: 3 10*3/uL (ref 1.4–7.7)
RBC: 5.15 Mil/uL (ref 4.22–5.81)
RDW: 13.9 % (ref 11.5–14.6)

## 2011-04-02 LAB — LDL CHOLESTEROL, DIRECT: Direct LDL: 125.6 mg/dL

## 2011-04-02 LAB — POCT URINALYSIS DIPSTICK
Leukocytes, UA: NEGATIVE
Urobilinogen, UA: 0.2

## 2011-04-02 LAB — PSA: PSA: 0.98 ng/mL (ref 0.10–4.00)

## 2011-04-02 LAB — LIPID PANEL: Total CHOL/HDL Ratio: 5

## 2011-04-02 LAB — TSH: TSH: 7.15 u[IU]/mL — ABNORMAL HIGH (ref 0.35–5.50)

## 2011-04-02 LAB — HEPATIC FUNCTION PANEL
Alkaline Phosphatase: 79 U/L (ref 39–117)
Total Protein: 6.4 g/dL (ref 6.0–8.3)

## 2011-04-02 MED ORDER — AMLODIPINE-OLMESARTAN 10-40 MG PO TABS: 1.0000 | ORAL_TABLET | Freq: Every day | ORAL | Status: DC

## 2011-04-02 MED ORDER — OMEPRAZOLE 20 MG PO CPDR: 20.0000 mg | DELAYED_RELEASE_CAPSULE | Freq: Every day | ORAL | Status: DC

## 2011-04-02 MED ORDER — METOPROLOL SUCCINATE ER 50 MG PO TB24: 50.0000 mg | ORAL_TABLET | Freq: Every day | ORAL | Status: DC

## 2011-04-02 NOTE — Telephone Encounter (Signed)
rx ready for pick up and patient is aware  

## 2011-04-06 NOTE — Progress Notes (Signed)
Quick Note:  Pt aware of lab results. Pt states he has not had trouble with his thyroid before and would like to hold off on the medication until this is cleared up. ______

## 2011-04-28 ENCOUNTER — Ambulatory Visit (INDEPENDENT_AMBULATORY_CARE_PROVIDER_SITE_OTHER): Payer: BC Managed Care – PPO | Admitting: Internal Medicine

## 2011-04-28 ENCOUNTER — Encounter: Payer: Self-pay | Admitting: Internal Medicine

## 2011-04-28 VITALS — BP 142/88 | HR 52 | Temp 98.3°F | Ht 70.0 in | Wt 251.0 lb

## 2011-04-28 DIAGNOSIS — I10 Essential (primary) hypertension: Secondary | ICD-10-CM

## 2011-04-28 DIAGNOSIS — E039 Hypothyroidism, unspecified: Secondary | ICD-10-CM | POA: Insufficient documentation

## 2011-04-28 DIAGNOSIS — K219 Gastro-esophageal reflux disease without esophagitis: Secondary | ICD-10-CM

## 2011-04-28 DIAGNOSIS — E785 Hyperlipidemia, unspecified: Secondary | ICD-10-CM

## 2011-04-28 DIAGNOSIS — Z0000 Encounter for general adult medical examination without abnormal findings: Secondary | ICD-10-CM

## 2011-04-28 DIAGNOSIS — M109 Gout, unspecified: Secondary | ICD-10-CM

## 2011-04-28 MED ORDER — OMEPRAZOLE 20 MG PO CPDR: 20.0000 mg | DELAYED_RELEASE_CAPSULE | Freq: Every day | ORAL | Status: DC

## 2011-04-28 MED ORDER — LIDOCAINE HCL 3 % EX CREA: 1.0000 "application " | TOPICAL_CREAM | CUTANEOUS | Status: DC | PRN

## 2011-04-28 MED ORDER — LEVOTHYROXINE SODIUM 50 MCG PO TABS: 50.0000 ug | ORAL_TABLET | Freq: Every day | ORAL | Status: DC

## 2011-04-28 MED ORDER — METOPROLOL SUCCINATE ER 50 MG PO TB24: 50.0000 mg | ORAL_TABLET | Freq: Every day | ORAL | Status: DC

## 2011-04-28 MED ORDER — AMLODIPINE-OLMESARTAN 10-40 MG PO TABS: 1.0000 | ORAL_TABLET | Freq: Every day | ORAL | Status: DC

## 2011-04-28 NOTE — Assessment & Plan Note (Signed)
Start synthroid today

## 2011-04-28 NOTE — Progress Notes (Signed)
CPX.  Past Medical History  Diagnosis Date  . Colon polyp, hyperplastic   . Gout   . HTN (hypertension)   . GI bleed 2005    after colonoscopy  . Hyperlipidemia     History   Social History  . Marital Status: Married    Spouse Name: N/A    Number of Children: N/A  . Years of Education: N/A   Occupational History  . Not on file.   Social History Main Topics  . Smoking status: Former Smoker    Quit date: 02/16/1968  . Smokeless tobacco: Not on file  . Alcohol Use: 5.0 oz/week    10 drink(s) per week  . Drug Use: No  . Sexually Active: Not on file   Other Topics Concern  . Not on file   Social History Narrative  . No narrative on file    Past Surgical History  Procedure Date  . Tumor removal 1984    from Jaw  . Knee arthroscopy 2011    Dr. Shelle Iron    Family History  Problem Relation Age of Onset  . Stroke Mother   . Diabetes Mother     Allergies  Allergen Reactions  . Ace Inhibitors   . Atorvastatin     REACTION: breast tenderness  . Doxycycline     REACTION: stomach cramping. flatus    Current Outpatient Prescriptions on File Prior to Visit  Medication Sig Dispense Refill  . acyclovir (ZOVIRAX) 400 MG tablet TAKE 1 TABLET 5 TIMES A DAY AS NEEDED FOR COLD SORES  50 tablet  0  . amLODipine-olmesartan (AZOR) 10-40 MG per tablet Take 1 tablet by mouth daily.  90 tablet  0  . aspirin 81 MG tablet Take 81 mg by mouth daily.        . metoprolol succinate (TOPROL-XL) 50 MG 24 hr tablet Take 1 tablet (50 mg total) by mouth daily.  90 tablet  0  . nitroGLYCERIN (NITROSTAT) 0.4 MG SL tablet Place 1 tablet (0.4 mg total) under the tongue every 5 (five) minutes as needed for chest pain.  100 tablet  3  . omeprazole (PRILOSEC) 20 MG capsule Take 1 capsule (20 mg total) by mouth daily.  90 capsule  0     patient denies chest pain, shortness of breath, orthopnea. Denies lower extremity edema, abdominal pain, change in appetite, change in bowel movements. Patient  denies rashes, musculoskeletal complaints. No other specific complaints in a complete review of systems.   BP 142/88  Pulse 52  Temp(Src) 98.3 F (36.8 C) (Oral)  Ht 5\' 10"  (1.778 m)  Wt 251 lb (113.853 kg)  BMI 36.01 kg/m2 Well-developed male in no acute distress. HEENT exam atraumatic, normocephalic, extraocular muscles are intact. Conjunctivae are pink without exudate. Neck is supple without lymphadenopathy, thyromegaly, jugular venous distention. Chest is clear to auscultation without increased work of breathing. Cardiac exam S1-S2 are regular. The PMI is normal. No significant murmurs or gallops. Abdominal exam active bowel sounds, soft, nontender. No abdominal bruits. Extremities no clubbing cyanosis or edema. Peripheral pulses are normal without bruits. Neurologic exam alert and oriented without any motor or sensory deficits. Rectal exam normal tone prostate normal size without masses or asymmetry.   Well visit: health maint UTD- Needs to lose weight  Discussed immunizations--refuses pneumovax and zostavax

## 2011-04-29 ENCOUNTER — Encounter: Payer: BC Managed Care – PPO | Admitting: Internal Medicine

## 2011-05-21 ENCOUNTER — Telehealth: Payer: Self-pay | Admitting: Internal Medicine

## 2011-05-21 NOTE — Telephone Encounter (Signed)
Patient called stating that he need a refill on his lidamantle cre 3% 28.35gm and states that the pharmacy has been trying to get an alternative with no response from the MD. Please assist.

## 2011-05-21 NOTE — Telephone Encounter (Signed)
Per pt pharmacy sent rx back to him saying that Dr needs to prescribe alternative medicine.  What do you want to change it to?

## 2011-05-24 NOTE — Telephone Encounter (Signed)
Call the pharmacy and see if they have a generic or alternative. I don't want to call in another medication that they don't have.

## 2011-05-24 NOTE — Telephone Encounter (Signed)
Called in a rx for a generic lidocaine 3% cream

## 2011-08-24 ENCOUNTER — Other Ambulatory Visit: Payer: Self-pay | Admitting: Internal Medicine

## 2011-10-10 IMAGING — CR DG KNEE 1-2V PORT*R*
1 series · 2 of 2 positions shown · non-contrast
Comparison: 07/27/2010.

CLINICAL DATA: Postop.  Total knee arthroplasty.

PORTABLE RIGHT KNEE - 1-2 VIEW

[Series 1: AP · right · 2 of 2 slices shown]
[im 1/2]
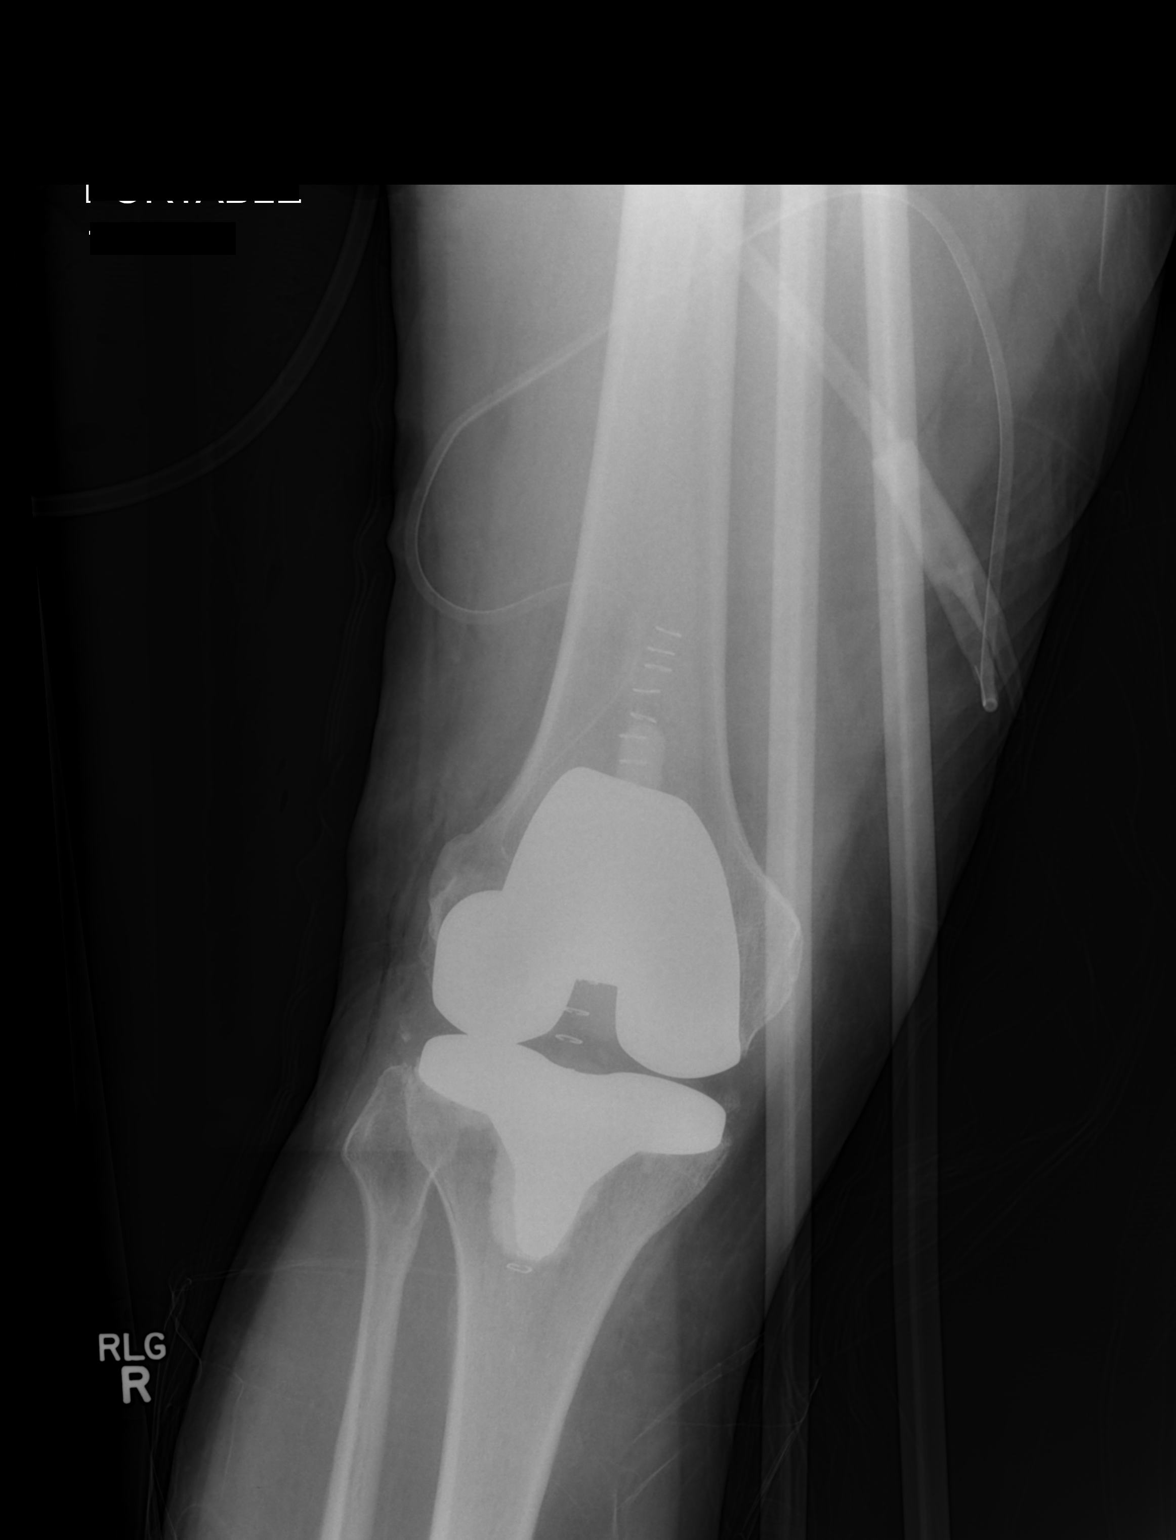
[im 2/2]
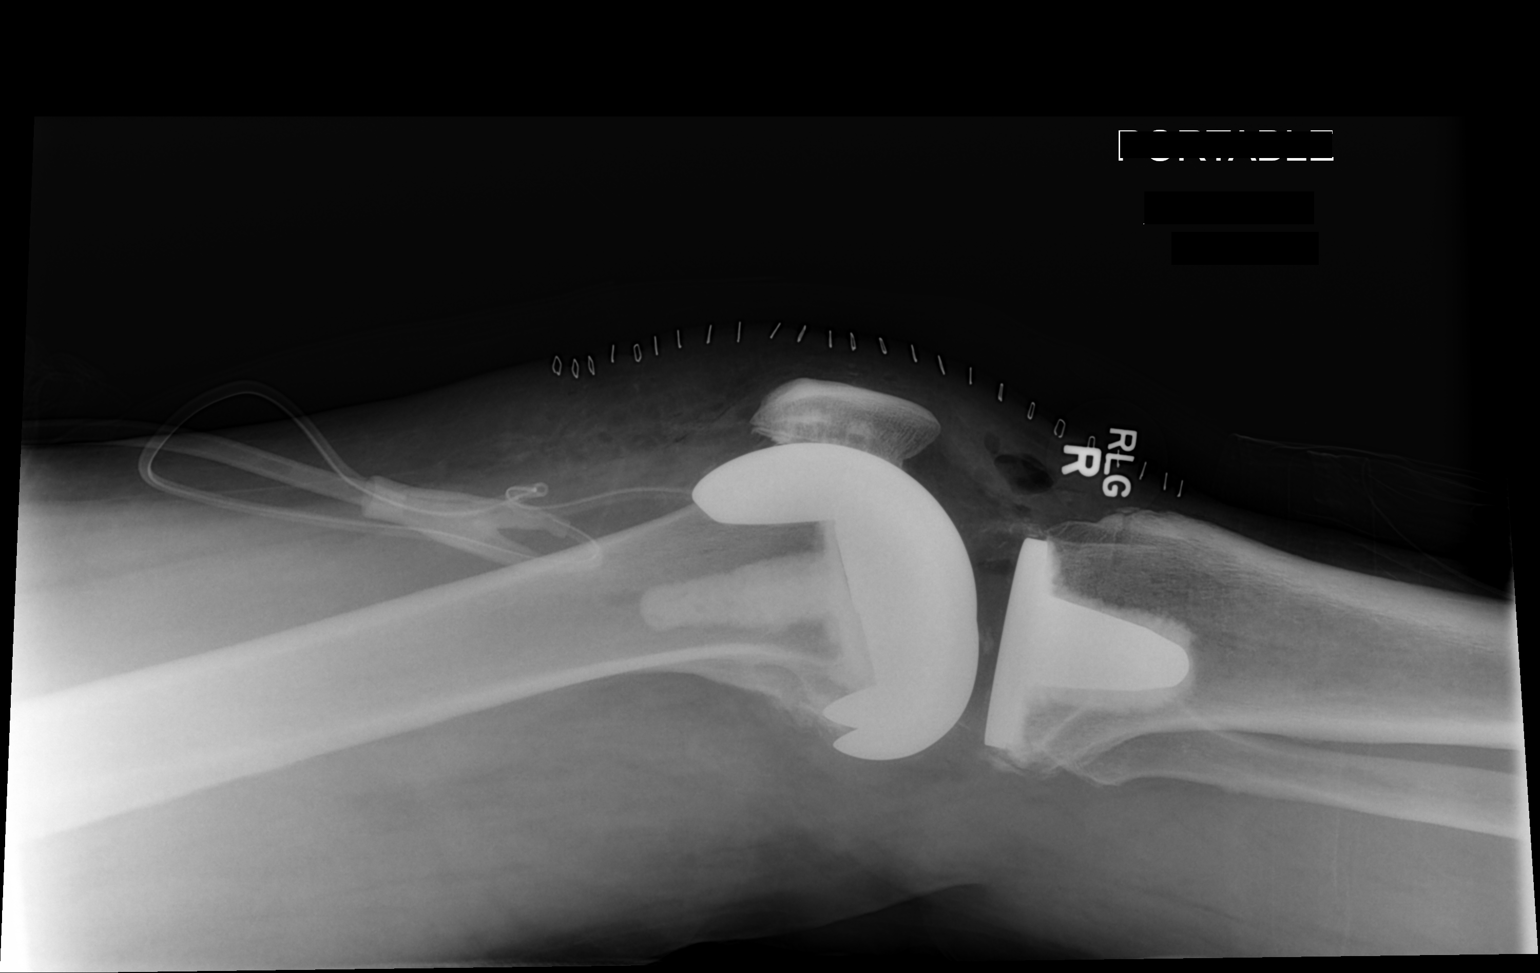

[2 of 2 positions shown; findings below may reference images not displayed]

FINDINGS: There is a postoperative appearance.  Total knee
arthroplasty procedure has been performed.  There is expected
relationship between the components of the arthroplasty.  No
dislocation or disruption of hardware is seen.  Soft tissue edema
and air is seen at the operative site.  Surgical staples are seen.
Surgical drain is in place.
IMPRESSION: Postoperative appearance.  Total knee arthroplasty has been
performed.  See above for details.

## 2012-02-10 ENCOUNTER — Ambulatory Visit (INDEPENDENT_AMBULATORY_CARE_PROVIDER_SITE_OTHER): Payer: BC Managed Care – PPO | Admitting: Family Medicine

## 2012-02-10 ENCOUNTER — Encounter: Payer: Self-pay | Admitting: Family Medicine

## 2012-02-10 VITALS — BP 124/80 | HR 63 | Temp 98.4°F | Ht 70.0 in | Wt 262.0 lb

## 2012-02-10 DIAGNOSIS — J069 Acute upper respiratory infection, unspecified: Secondary | ICD-10-CM

## 2012-02-10 MED ORDER — GUAIFENESIN-CODEINE 100-10 MG/5ML PO SYRP: 5.0000 mL | ORAL_SOLUTION | Freq: Every evening | ORAL | Status: DC | PRN

## 2012-02-10 NOTE — Progress Notes (Signed)
Chief Complaint  Patient presents with  . Cough    HPI: -started: 2-3 days -symptoms:nasal congestion, sore throat, cough, yellow drainage -denies:fever, SOB, NVD, tooth pain, strep or mono exposure -has tried: had cough medication on hand and used that -sick contacts: daughter and son and law have had the same symptoms  ROS: See pertinent positives and negatives per HPI.  Past Medical History  Diagnosis Date  . Colon polyp, hyperplastic   . Gout   . HTN (hypertension)   . GI bleed 2005    after colonoscopy  . Hyperlipidemia     Family History  Problem Relation Age of Onset  . Stroke Mother   . Diabetes Mother     History   Social History  . Marital Status: Married    Spouse Name: N/A    Number of Children: N/A  . Years of Education: N/A   Social History Main Topics  . Smoking status: Former Smoker    Quit date: 02/16/1968  . Smokeless tobacco: None  . Alcohol Use: 5.0 oz/week    10 drink(s) per week  . Drug Use: No  . Sexually Active: None   Other Topics Concern  . None   Social History Narrative  . None    Current outpatient prescriptions:acyclovir (ZOVIRAX) 400 MG tablet, TAKE 1 TABLET 5 TIMES A DAY AS NEEDED FOR COLD SORES, Disp: 50 tablet, Rfl: 0;  amLODipine-olmesartan (AZOR) 10-40 MG per tablet, Take 1 tablet by mouth daily., Disp: 90 tablet, Rfl: 3;  aspirin 81 MG tablet, Take 81 mg by mouth daily.  , Disp: , Rfl: ;  levothyroxine (SYNTHROID, LEVOTHROID) 50 MCG tablet, Take 1 tablet (50 mcg total) by mouth daily., Disp: 90 tablet, Rfl: 3 lidocaine (LINDAMANTLE) 3 % CREA cream, Apply 1 application topically as needed., Disp: 85 g, Rfl: 3;  metoprolol succinate (TOPROL-XL) 50 MG 24 hr tablet, Take 1 tablet (50 mg total) by mouth daily., Disp: 90 tablet, Rfl: 3;  nitroGLYCERIN (NITROSTAT) 0.4 MG SL tablet, Place 0.4 mg under the tongue every 5 (five) minutes as needed., Disp: , Rfl:  omeprazole (PRILOSEC) 20 MG capsule, Take 1 capsule (20 mg total) by  mouth daily., Disp: 90 capsule, Rfl: 3;  guaiFENesin-codeine (ROBITUSSIN AC) 100-10 MG/5ML syrup, Take 5 mLs by mouth at bedtime as needed for cough (Do not take iwth alcohol or other sedating medications. Do not drive after taking this medication.)., Disp: 120 mL, Rfl: 0  EXAM:  Filed Vitals:   02/10/12 1432  BP: 124/80  Pulse: 63  Temp: 98.4 F (36.9 C)    Body mass index is 37.59 kg/(m^2).  GENERAL: vitals reviewed and listed above, alert, oriented, appears well hydrated and in no acute distress  HEENT: atraumatic, conjunttiva clear, no obvious abnormalities on inspection of external nose and ears, normal appearance of ear canals and TMs, clear nasal congestion, mild post oropharyngeal erythema with PND, no tonsillar edema or exudate, no sinus TTP  NECK: no obvious masses on inspection  LUNGS: clear to auscultation bilaterally, no wheezes, rales or rhonchi, good air movement  CV: HRRR, no peripheral edema  MS: moves all extremities without noticeable abnormality  PSYCH: pleasant and cooperative, no obvious depression or anxiety  ASSESSMENT AND PLAN:  Discussed the following assessment and plan:  1. Viral upper respiratory illness  guaiFENesin-codeine (ROBITUSSIN AC) 100-10 MG/5ML syrup  -likely viral URI - advised supportive care, return precations (discussed risks of cough medication) -Patient advised to return or notify a doctor immediately if symptoms  worsen or persist or new concerns arise.  Patient Instructions  INSTRUCTIONS FOR UPPER RESPIRATORY INFECTION:  -plenty of rest and fluids  -nasal saline wash 2-3 times daily (use prepackaged nasal saline or bottled/distilled water if making your own)   -clean nose with nasal saline before using the nasal steroid or sinex  -can use sinex nasal spray for drainage and nasal congestion - but do NOT use longer then 3-4 days  -can use tylenol or ibuprofen as directed for aches and sorethroat  -in the winter time, using a  humidifier at night is helpful (please follow cleaning instructions)  -if you are taking a cough medication - use only as directed, may also try a teaspoon of honey to coat the throat and throat lozenges  -for sore throat, salt water gargles can help  -follow up if you have fevers, facial pain, tooth pain, difficulty breathing or are worsening or not getting better in 5-7 days      Kasyn Stouffer R.

## 2012-02-10 NOTE — Patient Instructions (Signed)

## 2012-02-18 ENCOUNTER — Encounter: Payer: Self-pay | Admitting: Family Medicine

## 2012-02-18 ENCOUNTER — Ambulatory Visit (INDEPENDENT_AMBULATORY_CARE_PROVIDER_SITE_OTHER): Payer: BC Managed Care – PPO | Admitting: Family Medicine

## 2012-02-18 VITALS — BP 112/70 | HR 73 | Temp 98.0°F | Wt 255.0 lb

## 2012-02-18 DIAGNOSIS — R059 Cough, unspecified: Secondary | ICD-10-CM

## 2012-02-18 DIAGNOSIS — J069 Acute upper respiratory infection, unspecified: Secondary | ICD-10-CM

## 2012-02-18 DIAGNOSIS — R05 Cough: Secondary | ICD-10-CM

## 2012-02-18 MED ORDER — AZITHROMYCIN 250 MG PO TABS: ORAL_TABLET | ORAL | Status: DC

## 2012-02-18 MED ORDER — HYDROCODONE-HOMATROPINE 5-1.5 MG/5ML PO SYRP: 5.0000 mL | ORAL_SOLUTION | Freq: Three times a day (TID) | ORAL | Status: DC | PRN

## 2012-02-18 NOTE — Progress Notes (Signed)
Chief Complaint  Patient presents with  . Cough    not any better; soreness from coughing    HPI: -started: 10 days ago, seen 7 days ago and tx with supportive care for VURI - feeling better but cough continues -symptoms: cough - sputum production, fatigue -denies:fever, SOB, NVD, tooth pain, strep or mono exposure -has tried: cough medication with codeine - makes him dizzy -sick contacts: family members -Hx of: no hx lung disease   ROS: See pertinent positives and negatives per HPI.  Past Medical History  Diagnosis Date  . Colon polyp, hyperplastic   . Gout   . HTN (hypertension)   . GI bleed 2005    after colonoscopy  . Hyperlipidemia     Family History  Problem Relation Age of Onset  . Stroke Mother   . Diabetes Mother     History   Social History  . Marital Status: Married    Spouse Name: N/A    Number of Children: N/A  . Years of Education: N/A   Social History Main Topics  . Smoking status: Former Smoker    Quit date: 02/16/1968  . Smokeless tobacco: None  . Alcohol Use: 5.0 oz/week    10 drink(s) per week  . Drug Use: No  . Sexually Active: None   Other Topics Concern  . None   Social History Narrative  . None    Current outpatient prescriptions:acyclovir (ZOVIRAX) 400 MG tablet, TAKE 1 TABLET 5 TIMES A DAY AS NEEDED FOR COLD SORES, Disp: 50 tablet, Rfl: 0;  amLODipine-olmesartan (AZOR) 10-40 MG per tablet, Take 1 tablet by mouth daily., Disp: 90 tablet, Rfl: 3;  aspirin 81 MG tablet, Take 81 mg by mouth daily.  , Disp: , Rfl:  guaiFENesin-codeine (ROBITUSSIN AC) 100-10 MG/5ML syrup, Take 5 mLs by mouth at bedtime as needed for cough (Do not take iwth alcohol or other sedating medications. Do not drive after taking this medication.)., Disp: 120 mL, Rfl: 0;  levothyroxine (SYNTHROID, LEVOTHROID) 50 MCG tablet, Take 1 tablet (50 mcg total) by mouth daily., Disp: 90 tablet, Rfl: 3 lidocaine (LINDAMANTLE) 3 % CREA cream, Apply 1 application topically as  needed., Disp: 85 g, Rfl: 3;  metoprolol succinate (TOPROL-XL) 50 MG 24 hr tablet, Take 1 tablet (50 mg total) by mouth daily., Disp: 90 tablet, Rfl: 3;  nitroGLYCERIN (NITROSTAT) 0.4 MG SL tablet, Place 0.4 mg under the tongue every 5 (five) minutes as needed., Disp: , Rfl:  omeprazole (PRILOSEC) 20 MG capsule, Take 1 capsule (20 mg total) by mouth daily., Disp: 90 capsule, Rfl: 3;  azithromycin (ZITHROMAX) 250 MG tablet, 2 tabs the first day the 1 tab daily for the next 4 days, Disp: 6 tablet, Rfl: 0;  HYDROcodone-homatropine (HYCODAN) 5-1.5 MG/5ML syrup, Take 5 mLs by mouth every 8 (eight) hours as needed for cough., Disp: 120 mL, Rfl: 0  EXAM:  Filed Vitals:   02/18/12 0836  BP: 112/70  Pulse: 73  Temp: 98 F (36.7 C)    There is no height on file to calculate BMI.  GENERAL: vitals reviewed and listed above, alert, oriented, appears well hydrated and in no acute distress  HEENT: atraumatic, conjunttiva clear, no obvious abnormalities on inspection of external nose and ears, normal appearance of ear canals and TMs, clear nasal congestion, mild post oropharyngeal erythema with PND, no tonsillar edema or exudate, no sinus TTP  NECK: no obvious masses on inspection  LUNGS: clear to auscultation bilaterally, no wheezes, rales or rhonchi, good  air movement  CV: HRRR, no peripheral edema  MS: moves all extremities without noticeable abnormality  PSYCH: pleasant and cooperative, no obvious depression or anxiety  ASSESSMENT AND PLAN:  Discussed the following assessment and plan:  1. Cough  azithromycin (ZITHROMAX) 250 MG tablet, HYDROcodone-homatropine (HYCODAN) 5-1.5 MG/5ML syrup  2. Upper respiratory infection     -URI, likey viral - will tx with azithro in sm chance could be bacterial pt wants to hold off on this but wants to have on hand in case does not continue to improve. Changed cough medication per his request. -advised my had some RAd/Bronchitis and discussed ICS versus  prednisone - pt refused -Patient advised to return or notify a doctor immediately if symptoms worsen or persist or new concerns arise.  Patient Instructions  - take cough medication as prescribed  - take antibiotic if worsening or not improved next week  - follow up if worsening or other concerns     Karan Inclan R.

## 2012-02-18 NOTE — Patient Instructions (Addendum)
-   take cough medication as prescribed  - take antibiotic if worsening or not improved next week  - follow up if worsening or other concerns

## 2012-06-07 ENCOUNTER — Telehealth: Payer: Self-pay | Admitting: Internal Medicine

## 2012-06-07 DIAGNOSIS — K219 Gastro-esophageal reflux disease without esophagitis: Secondary | ICD-10-CM

## 2012-06-07 DIAGNOSIS — I10 Essential (primary) hypertension: Secondary | ICD-10-CM

## 2012-06-07 MED ORDER — METOPROLOL SUCCINATE ER 50 MG PO TB24: 50.0000 mg | ORAL_TABLET | Freq: Every day | ORAL | Status: DC

## 2012-06-07 MED ORDER — AMLODIPINE-OLMESARTAN 10-40 MG PO TABS: 1.0000 | ORAL_TABLET | Freq: Every day | ORAL | Status: DC

## 2012-06-07 MED ORDER — LIDOCAINE HCL 3 % EX CREA: 1.0000 "application " | TOPICAL_CREAM | CUTANEOUS | Status: DC | PRN

## 2012-06-07 NOTE — Telephone Encounter (Signed)
Pt has not been seen since March 2013 but he has an appt in July for a CPX.  Refills given to last until appt ok per Dr Cato Mulligan

## 2012-06-07 NOTE — Telephone Encounter (Signed)
Pt called and requested a refill of the following medications; amLODipine-olmesartan (AZOR) 10-40 MG per tablet, lidocaine (LINDAMANTLE) 3 % CREA cream, metoprolol succinate (TOPROL-XL) 50 MG 24 hr tablet, and omeprazole (PRILOSEC) 20 MG capsule. Please send them to [facility].

## 2012-06-12 MED ORDER — OMEPRAZOLE 20 MG PO CPDR: 20.0000 mg | DELAYED_RELEASE_CAPSULE | Freq: Every day | ORAL | Status: DC

## 2012-06-12 NOTE — Telephone Encounter (Signed)
Omeprazole sent in electronically

## 2012-06-12 NOTE — Telephone Encounter (Signed)
PT's omeprazole (PRILOSEC) 20 MG capsule was on the list to be called in, but wasn't. Please assist.

## 2012-06-12 NOTE — Addendum Note (Signed)
Addended by: Alfred Levins D on: 06/12/2012 10:19 AM   Modules accepted: Orders

## 2012-06-13 ENCOUNTER — Ambulatory Visit (INDEPENDENT_AMBULATORY_CARE_PROVIDER_SITE_OTHER): Payer: BC Managed Care – PPO | Admitting: Family Medicine

## 2012-06-13 ENCOUNTER — Telehealth: Payer: Self-pay | Admitting: Internal Medicine

## 2012-06-13 ENCOUNTER — Encounter: Payer: Self-pay | Admitting: Family Medicine

## 2012-06-13 VITALS — BP 132/84 | Temp 98.5°F | Wt 260.0 lb

## 2012-06-13 DIAGNOSIS — R1310 Dysphagia, unspecified: Secondary | ICD-10-CM

## 2012-06-13 DIAGNOSIS — K219 Gastro-esophageal reflux disease without esophagitis: Secondary | ICD-10-CM

## 2012-06-13 DIAGNOSIS — E039 Hypothyroidism, unspecified: Secondary | ICD-10-CM

## 2012-06-13 MED ORDER — LEVOTHYROXINE SODIUM 50 MCG PO TABS: 50.0000 ug | ORAL_TABLET | Freq: Every day | ORAL | Status: DC

## 2012-06-13 NOTE — Progress Notes (Signed)
Chief Complaint  Patient presents with  . Gastrophageal Reflux    has been out of omperazole for 1 week     HPI:  67 yo M pt of Dr. Cato Mulligan here for acute visit for trouble swallowing: -has had bad acid reflux for years -has been on omeprazole 20mg  daily for years and this helps, but still had a few episodes  -ran out of medicine about one week ago and since has felt like liquids and solids are getting stuck in lower esophagus - makes him belch and then feels better -does feel some pain in this area and feels like choking when swallowing and this is worse with solid foods then with liquids -denies: weight loss, melena, fevers, SOB, CP, dysphonia -reports thinks had EGD in 2007 -hx ? Esophageal stricture  ROS: See pertinent positives and negatives per HPI.  Past Medical History  Diagnosis Date  . Colon polyp, hyperplastic   . Gout   . HTN (hypertension)   . GI bleed 2005    after colonoscopy  . Hyperlipidemia     Family History  Problem Relation Age of Onset  . Stroke Mother   . Diabetes Mother     History   Social History  . Marital Status: Married    Spouse Name: N/A    Number of Children: N/A  . Years of Education: N/A   Social History Main Topics  . Smoking status: Former Smoker    Quit date: 02/16/1968  . Smokeless tobacco: None  . Alcohol Use: 5.0 oz/week    10 drink(s) per week  . Drug Use: No  . Sexually Active: None   Other Topics Concern  . None   Social History Narrative  . None    Current outpatient prescriptions:acyclovir (ZOVIRAX) 400 MG tablet, TAKE 1 TABLET 5 TIMES A DAY AS NEEDED FOR COLD SORES, Disp: 50 tablet, Rfl: 0;  amLODipine-olmesartan (AZOR) 10-40 MG per tablet, Take 1 tablet by mouth daily., Disp: 90 tablet, Rfl: 0;  aspirin 81 MG tablet, Take 81 mg by mouth daily.  , Disp: , Rfl:  guaiFENesin-codeine (ROBITUSSIN AC) 100-10 MG/5ML syrup, Take 5 mLs by mouth at bedtime as needed for cough (Do not take iwth alcohol or other sedating  medications. Do not drive after taking this medication.)., Disp: 120 mL, Rfl: 0;  HYDROcodone-homatropine (HYCODAN) 5-1.5 MG/5ML syrup, Take 5 mLs by mouth every 8 (eight) hours as needed for cough., Disp: 120 mL, Rfl: 0 lidocaine (LINDAMANTLE) 3 % CREA cream, Apply 1 application topically as needed., Disp: 85 g, Rfl: 0;  metoprolol succinate (TOPROL-XL) 50 MG 24 hr tablet, Take 1 tablet (50 mg total) by mouth daily., Disp: 90 tablet, Rfl: 0;  nitroGLYCERIN (NITROSTAT) 0.4 MG SL tablet, Place 0.4 mg under the tongue every 5 (five) minutes as needed., Disp: , Rfl:  omeprazole (PRILOSEC) 20 MG capsule, Take 1 capsule (20 mg total) by mouth daily., Disp: 90 capsule, Rfl: 0;  azithromycin (ZITHROMAX) 250 MG tablet, 2 tabs the first day the 1 tab daily for the next 4 days, Disp: 6 tablet, Rfl: 0  EXAM:  Filed Vitals:   06/13/12 1045  BP: 132/84  Temp: 98.5 F (36.9 C)    Body mass index is 37.31 kg/(m^2).  GENERAL: vitals reviewed and listed above, alert, oriented, appears well hydrated and in no acute distress  HEENT: atraumatic, conjunttiva clear, no obvious abnormalities on inspection of external nose and ears  NECK: no obvious masses on inspection  LUNGS: clear to auscultation  bilaterally, no wheezes, rales or rhonchi, good air movement  CV: HRRR, no peripheral edema  ABD: soft, NTTP  MS: moves all extremities without noticeable abnormality  PSYCH: pleasant and cooperative, no obvious depression or anxiety  ASSESSMENT AND PLAN:  Discussed the following assessment and plan:  Dysphagia - Plan: Ambulatory referral to Gastroenterology  GERD (gastroesophageal reflux disease) - Plan: Ambulatory referral to Gastroenterology  -referral to GI, soft foods only until seen by GI, return/emergency precuations -increase omeprazole to 40mg  daily - samples given -Patient advised to return or notify a doctor immediately if symptoms worsen or persist or new concerns arise.  There are no  Patient Instructions on file for this visit.   Kriste Basque R.

## 2012-06-13 NOTE — Telephone Encounter (Signed)
Left message for patient to call back  

## 2012-06-13 NOTE — Telephone Encounter (Signed)
Patient is not able to come this week.  He works in [place].  He will come next Monday at 3:30 and see Mike Gip PA

## 2012-06-13 NOTE — Addendum Note (Signed)
Addended by: Azucena Freed on: 06/13/2012 11:16 AM   Modules accepted: Orders

## 2012-06-19 ENCOUNTER — Ambulatory Visit (INDEPENDENT_AMBULATORY_CARE_PROVIDER_SITE_OTHER): Payer: BC Managed Care – PPO | Admitting: Physician Assistant

## 2012-06-19 ENCOUNTER — Encounter: Payer: Self-pay | Admitting: Physician Assistant

## 2012-06-19 VITALS — BP 132/78 | HR 88 | Ht 70.0 in | Wt 259.0 lb

## 2012-06-19 DIAGNOSIS — Z860100 Personal history of colon polyps, unspecified: Secondary | ICD-10-CM

## 2012-06-19 DIAGNOSIS — R1310 Dysphagia, unspecified: Secondary | ICD-10-CM

## 2012-06-19 DIAGNOSIS — K219 Gastro-esophageal reflux disease without esophagitis: Secondary | ICD-10-CM

## 2012-06-19 DIAGNOSIS — K5732 Diverticulitis of large intestine without perforation or abscess without bleeding: Secondary | ICD-10-CM | POA: Insufficient documentation

## 2012-06-19 MED ORDER — MOVIPREP 100 G PO SOLR: 1.0000 | Freq: Once | ORAL | Status: AC

## 2012-06-19 NOTE — Patient Instructions (Addendum)
You have been scheduled for an endoscopy with dilation and colonoscopy with propofol. Please follow the written instructions given to you at your visit today. Please pick up your prep at the pharmacy within the next 1-3 days. If you use inhalers (even only as needed), please bring them with you on the day of your procedure. Your physician has requested that you go to www.startemmi.com and enter the access code given to you at your visit today. This web site gives a general overview about your procedure. However, you should still follow specific instructions given to you by our office regarding your preparation for the procedure.  Please discontinue Aspirin 5 days prior to your test.  Increase Prilosec to twice daily x 2 weeks, then decrease back down to 1 tablet once daily thereafter. You already have a prescription for this.  CC: Dr Birdie Sons

## 2012-06-19 NOTE — Progress Notes (Signed)
Subjective:    Patient ID: [name], male    DOB: [birth year], 67 y.o.   MRN: [id number]  HPI [name] is a pleasant 67 year old white male known to Dr. Leone Payor who has history of chronic GERD and colon polyps He comes in today because of new development of dysphagia . He said he had been doing well on Prilosec but ran out recently while he was traveling for work and says he began noticing increased reflux symptoms and then a sensation as if food was sticking transiently. He is also noticed increased belching. He does not describe any heartburn or indigestion. He says the dysphagia is mild he does not have to stop eating etc. has not had any regurgitation. E. started on Prilosec last week at 40 mg daily and says this does seem to be helping . EGD in March of 2007 did show a distal esophageal stricture and also a patent esophageal ring. Dilation was not done because he was not complaining of dysphagia at that time. Flexible sigmoidoscopy was done in April of 2005 he did have one 7 mm rectal polyp removed which was adenomatous. Prior to that he had colonoscopy in 2004 with removal of 3 polyps.    Review of Systems  Constitutional: Negative.   HENT: Positive for trouble swallowing.   Eyes: Negative.   Respiratory: Negative.   Cardiovascular: Negative.   Gastrointestinal: Negative.   Endocrine: Negative.   Genitourinary: Negative.   Musculoskeletal: Negative.   Allergic/Immunologic: Negative.   Neurological: Negative.   Hematological: Negative.   Psychiatric/Behavioral: Negative.    Outpatient Prescriptions Prior to Visit  Medication Sig Dispense Refill  . acyclovir (ZOVIRAX) 400 MG tablet TAKE 1 TABLET 5 TIMES A DAY AS NEEDED FOR COLD SORES  50 tablet  0  . amLODipine-olmesartan (AZOR) 10-40 MG per tablet Take 1 tablet by mouth daily.  90 tablet  0  . aspirin 81 MG tablet Take 81 mg by mouth daily.        Marland Kitchen levothyroxine (SYNTHROID, LEVOTHROID) 50 MCG tablet Take 1 tablet (50 mcg  total) by mouth daily.  90 tablet  0  . lidocaine (LINDAMANTLE) 3 % CREA cream Apply 1 application topically as needed.  85 g  0  . metoprolol succinate (TOPROL-XL) 50 MG 24 hr tablet Take 1 tablet (50 mg total) by mouth daily.  90 tablet  0  . nitroGLYCERIN (NITROSTAT) 0.4 MG SL tablet Place 0.4 mg under the tongue every 5 (five) minutes as needed.      Marland Kitchen omeprazole (PRILOSEC) 20 MG capsule Take 1 capsule (20 mg total) by mouth daily.  90 capsule  0  . azithromycin (ZITHROMAX) 250 MG tablet 2 tabs the first day the 1 tab daily for the next 4 days  6 tablet  0  . guaiFENesin-codeine (ROBITUSSIN AC) 100-10 MG/5ML syrup Take 5 mLs by mouth at bedtime as needed for cough (Do not take iwth alcohol or other sedating medications. Do not drive after taking this medication.).  120 mL  0  . HYDROcodone-homatropine (HYCODAN) 5-1.5 MG/5ML syrup Take 5 mLs by mouth every 8 (eight) hours as needed for cough.  120 mL  0   No facility-administered medications prior to visit.   Allergies  Allergen Reactions  . Ace Inhibitors   . Atorvastatin     REACTION: breast tenderness  . Doxycycline     REACTION: stomach cramping. flatus   Patient Active Problem List   Diagnosis Date Noted  . Hypothyroid 04/28/2011  .  ARTHRITIS, GENERALIZED 02/27/2010  . MAMMOGRAM, ABNORMAL, LEFT 04/03/2008  . HYPERLIPIDEMIA 12/09/2006  . GOUT 08/18/2006  . HYPERTENSION 08/18/2006  . COLONIC POLYPS, HX OF 08/18/2006   History  Substance Use Topics  . Smoking status: Never Smoker   . Smokeless tobacco: Never Used  . Alcohol Use: 5.0 oz/week    10 drink(s) per week     Comment: Weekends only    family history includes Diabetes in his mother and Stroke in his mother.  There is no history of Colon cancer.     Objective:   Physical Exam well-developed white male in no acute distress, pleasant blood pressure 132/78 pulse 88 height 5 foot 10 weight 259. HEENT; nontraumatic normocephalic EOMI PERRLA sclera anicteric, Supple no  JVD, Cardiovascular; regular rate and rhythm with S1-S2 no murmur or gallop, Pulmonary clear bilaterally, Abdomen; soft nontender nondistended bowel sounds are active there is no palpable mass or hepatosplenomegaly, Rectal ;exam not done, Extremities;no clubbing cyanosis or edema skin warm and dry, Psych ;mood and affect normal and appropriate.        Assessment & Plan:  #89 67 year old male with chronic GERD and recent onset of mild dysphagia. This may be in part due to acute esophagitis developing while he was off of PPI therapy, however he did have a distal stricture noted several years ago which was not dilated . #2 history of adenomatous colon polyps-due for followup colonoscopy-history of post polypectomy bleed 2004 #3 hypertension #4 status post knee replacement #5 hyperlipidemia  Plan; continue Prilosec at 40 mg by mouth daily times at least 2 weeks then if symptoms resolved decrease to 20 mg once daily Schedule for upper endoscopy with probable Savary dilation with Dr. Benjamine Mola was discussed in detail with the patient and he is agreeable to proceed. Will also schedule for colonoscopy at that same time as he is due for followup-again procedure discussed in detail and patient agreeable to proceed

## 2012-06-20 ENCOUNTER — Encounter: Payer: Self-pay | Admitting: Internal Medicine

## 2012-06-23 NOTE — Progress Notes (Signed)
Agree with Ms. Esterwood's assessment and plan. Tiena Manansala E. Devron Cohick, MD, FACG   

## 2012-06-28 ENCOUNTER — Other Ambulatory Visit: Payer: Self-pay | Admitting: Internal Medicine

## 2012-07-27 ENCOUNTER — Other Ambulatory Visit (INDEPENDENT_AMBULATORY_CARE_PROVIDER_SITE_OTHER): Payer: BC Managed Care – PPO

## 2012-07-27 DIAGNOSIS — Z0000 Encounter for general adult medical examination without abnormal findings: Secondary | ICD-10-CM

## 2012-07-27 LAB — CBC WITH DIFFERENTIAL/PLATELET
Basophils Absolute: 0 10*3/uL (ref 0.0–0.1)
HCT: 48.1 % (ref 39.0–52.0)
Hemoglobin: 16.5 g/dL (ref 13.0–17.0)
Lymphs Abs: 1.5 10*3/uL (ref 0.7–4.0)
MCV: 93.1 fl (ref 78.0–100.0)
Monocytes Absolute: 0.7 10*3/uL (ref 0.1–1.0)
Monocytes Relative: 10.6 % (ref 3.0–12.0)
Neutro Abs: 4.1 10*3/uL (ref 1.4–7.7)
RDW: 13.3 % (ref 11.5–14.6)

## 2012-07-27 LAB — POCT URINALYSIS DIPSTICK
Blood, UA: NEGATIVE
Nitrite, UA: NEGATIVE
Spec Grav, UA: 1.02
Urobilinogen, UA: 0.2

## 2012-07-27 LAB — BASIC METABOLIC PANEL
BUN: 15 mg/dL (ref 6–23)
CO2: 28 mEq/L (ref 19–32)
Chloride: 106 mEq/L (ref 96–112)
GFR: 89.48 mL/min (ref >60.00)
Glucose, Bld: 111 mg/dL — ABNORMAL HIGH (ref 70–99)
Potassium: 4.7 mEq/L (ref 3.5–5.1)
Sodium: 140 mEq/L (ref 135–145)

## 2012-07-27 LAB — HEPATIC FUNCTION PANEL
ALT: 21 U/L (ref 0–53)
AST: 19 U/L (ref 0–37)
Albumin: 3.8 g/dL (ref 3.5–5.2)

## 2012-07-27 LAB — LIPID PANEL: Total CHOL/HDL Ratio: 5

## 2012-07-27 LAB — LDL CHOLESTEROL, DIRECT: Direct LDL: 122.7 mg/dL

## 2012-07-27 LAB — PSA: PSA: 0.92 ng/mL (ref 0.10–4.00)

## 2012-07-27 LAB — TSH: TSH: 3.54 u[IU]/mL (ref 0.35–5.50)

## 2012-08-03 ENCOUNTER — Encounter: Payer: Self-pay | Admitting: Internal Medicine

## 2012-08-03 ENCOUNTER — Ambulatory Visit (AMBULATORY_SURGERY_CENTER): Payer: BC Managed Care – PPO | Admitting: Internal Medicine

## 2012-08-03 VITALS — BP 149/75 | HR 47 | Temp 97.7°F | Resp 14 | Ht 70.0 in | Wt 259.0 lb

## 2012-08-03 DIAGNOSIS — Z1211 Encounter for screening for malignant neoplasm of colon: Secondary | ICD-10-CM

## 2012-08-03 DIAGNOSIS — K5730 Diverticulosis of large intestine without perforation or abscess without bleeding: Secondary | ICD-10-CM

## 2012-08-03 DIAGNOSIS — K219 Gastro-esophageal reflux disease without esophagitis: Secondary | ICD-10-CM

## 2012-08-03 DIAGNOSIS — R1310 Dysphagia, unspecified: Secondary | ICD-10-CM

## 2012-08-03 DIAGNOSIS — D131 Benign neoplasm of stomach: Secondary | ICD-10-CM

## 2012-08-03 DIAGNOSIS — D126 Benign neoplasm of colon, unspecified: Secondary | ICD-10-CM

## 2012-08-03 DIAGNOSIS — Z86010 Personal history of colonic polyps: Secondary | ICD-10-CM

## 2012-08-03 MED ORDER — OMEPRAZOLE 40 MG PO CPDR: 40.0000 mg | DELAYED_RELEASE_CAPSULE | Freq: Every day | ORAL | Status: DC

## 2012-08-03 MED ORDER — SODIUM CHLORIDE 0.9 % IV SOLN: 500.0000 mL | INTRAVENOUS | Status: DC

## 2012-08-03 NOTE — Progress Notes (Signed)
Patient did not experience any of the following events: a burn prior to discharge; a fall within the facility; wrong site/side/patient/procedure/implant event; or a hospital transfer or hospital admission upon discharge from the facility. (G8907) Patient did not have preoperative order for IV antibiotic SSI prophylaxis. (G8918)  

## 2012-08-03 NOTE — Progress Notes (Signed)
Procedure ends, awake to rcovery, report given and VSS.

## 2012-08-03 NOTE — Progress Notes (Signed)
Called to room to assist during endoscopic procedure.  Patient ID and intended procedure confirmed with present staff. Received instructions for my participation in the procedure from the performing physician. ewm 

## 2012-08-03 NOTE — Op Note (Signed)
[facility] [address] [place], [address]   ENDOSCOPY PROCEDURE REPORT  PATIENT: [name], [name]  MR#: [id number] BIRTHDATE: [birth year] , 66  yrs. old GENDER: Male ENDOSCOPIST: Iva Boop, MD, [facility] PROCEDURE DATE:  08/03/2012 PROCEDURE:  EGD w/ biopsy and Maloney dilation of esophagus ASA CLASS:     Class II INDICATIONS:  Dysphagia. MEDICATIONS: propofol (Diprivan) 200mg  IV, MAC sedation, administered by CRNA, and These medications were titrated to patient response per physician's verbal order TOPICAL ANESTHETIC: Cetacaine Spray  DESCRIPTION OF PROCEDURE: After the risks benefits and alternatives of the procedure were thoroughly explained, informed consent was obtained.  The LB [id number] endoscope was introduced through the mouth and advanced to the second portion of the duodenum. Without limitations.  The instrument was slowly withdrawn as the mucosa was fully examined.     ESOPHAGUS: Erosion was found at the gastroesophageal junction.  STOMACH: Multiple diminutive sessile polyps were found in the gastric body.  Multiple biopsies was performed using cold forceps. Sample sent for histology.  The remainder of the upper endoscopy exam was otherwise normal. Retroflexed views revealed no abnormalities.     The scope was then withdrawn from the patient and the procedure completed.  COMPLICATIONS: There were no complications. ENDOSCOPIC IMPRESSION: 1.   Erosion was found in the at the gastroesophageal junction 2.   Multiple diminutive sessile polyps were found in the gastric body 3.   The remainder of the upper endoscopy exam was otherwise normal  RECOMMENDATIONS: 1.  Clear liquids until 1030 , then soft foods rest of day.  Resume prior diet tomorrow. 2.  Await pathology results 3.  Continue PPI - increase to 40 mg omeprazole daily   eSigned:  Iva Boop, MD, [facility] 08/03/2012 9:42 AM  CC:The Patient

## 2012-08-03 NOTE — Patient Instructions (Addendum)
There was a small erosion - inflamed area - at the junction of esophagus and stomach. This is from acid reflux. I dilated the esophagus to help you swallow better. I took biopsies of benign-appearing stomach polyps.  I am changing the omeprazole to 40 mg daily.  I found and removed 5 colon polyps and saw diverticulosis.  I will let you know pathology results and when to have another routine colonoscopy by mail. If your swallowing problems are not gone after 1-2 months please return.  I appreciate the opportunity to care for you. Iva Boop, MD, FACG   YOU HAD AN ENDOSCOPIC PROCEDURE TODAY AT THE [facility]: Refer to the procedure report that was given to you for any specific questions about what was found during the examination.  If the procedure report does not answer your questions, please call your gastroenterologist to clarify.  If you requested that your care partner not be given the details of your procedure findings, then the procedure report has been included in a sealed envelope for you to review at your convenience later.  YOU SHOULD EXPECT: Some feelings of bloating in the abdomen. Passage of more gas than usual.  Walking can help get rid of the air that was put into your GI tract during the procedure and reduce the bloating. If you had a lower endoscopy (such as a colonoscopy or flexible sigmoidoscopy) you may notice spotting of blood in your stool or on the toilet paper. If you underwent a bowel prep for your procedure, then you may not have a normal bowel movement for a few days.  DIET: See Dilation Diet handout - Drink plenty of fluids but you should avoid alcoholic beverages for 24 hours.  ACTIVITY: Your care partner should take you home directly after the procedure.  You should plan to take it easy, moving slowly for the rest of the day.  You can resume normal activity the day after the procedure however you should NOT DRIVE or use heavy machinery for 24 hours  (because of the sedation medicines used during the test).    SYMPTOMS TO REPORT IMMEDIATELY: A gastroenterologist can be reached at any hour.  During normal business hours, 8:30 AM to 5:00 PM Monday through Friday, call [phone].  After hours and on weekends, please call the GI answering service at [phone] who will take a message and have the physician on call contact you.   Following lower endoscopy (colonoscopy or flexible sigmoidoscopy):  Excessive amounts of blood in the stool  Significant tenderness or worsening of abdominal pains  Swelling of the abdomen that is new, acute  Fever of 100F or higher  Following upper endoscopy (EGD)  Vomiting of blood or coffee ground material  New chest pain or pain under the shoulder blades  Painful or persistently difficult swallowing  New shortness of breath  Fever of 100F or higher  Black, tarry-looking stools  FOLLOW UP: If any biopsies were taken you will be contacted by phone or by letter within the next 1-3 weeks.  Call your gastroenterologist if you have not heard about the biopsies in 3 weeks.  Our staff will call the home number listed on your records the next business day following your procedure to check on you and address any questions or concerns that you may have at that time regarding the information given to you following your procedure. This is a courtesy call and so if there is no answer at the home number and  we have not heard from you through the emergency physician on call, we will assume that you have returned to your regular daily activities without incident.  SIGNATURES/CONFIDENTIALITY: You and/or your care partner have signed paperwork which will be entered into your electronic medical record.  These signatures attest to the fact that that the information above on your After Visit Summary has been reviewed and is understood.  Full responsibility of the confidentiality of this discharge information lies with you  and/or your care-partner.  Dilation diet, stricture, diverticulosis, polyps-handout given  Increase omperazole to 40 mg daily  Repeat colonoscopy will be determined by pathology  If swallowing problems are not gone in 1-2 months please return

## 2012-08-03 NOTE — Op Note (Signed)
[facility] [address] [place], [address]   COLONOSCOPY PROCEDURE REPORT  PATIENT: [name], [name]  MR#: [id number] BIRTHDATE: 05/25/1945 , 66  yrs. old GENDER: Male ENDOSCOPIST: Iva Boop, MD, [facility] PROCEDURE DATE:  08/03/2012 PROCEDURE:   Colonoscopy with biopsy and snare polypectomy ASA CLASS:   Class II INDICATIONS:Screening and surveillance,personal history of colonic polyps. MEDICATIONS: There was residual sedation effect present from prior procedure, propofol (Diprivan) 250mg  IV, These medications were titrated to patient response per physician's verbal order, and MAC sedation, administered by CRNA  DESCRIPTION OF PROCEDURE:   After the risks benefits and alternatives of the procedure were thoroughly explained, informed consent was obtained.  A digital rectal exam revealed no abnormalities of the rectum, A digital rectal exam revealed no prostatic nodules, and A digital rectal exam revealed the prostate was not enlarged.   The LB [id number]  endoscope was introduced through the anus and advanced to the cecum, which was identified by both the appendix and ileocecal valve. No adverse events experienced.   The quality of the prep was excellent using Suprep  The instrument was then slowly withdrawn as the colon was fully examined.      COLON FINDINGS: Five sessile polyps measuring 2-10 mm in size were found at the cecum.  A polypectomy was performed with cold forceps (2-3 mm polyps) and with a cold snare (6 and 10 mm polyps).  The resection was complete and the polyp tissue was completely retrieved.   Moderate diverticulosis was noted in the sigmoid colon.   There was mild scattered diverticulosis noted in the right colon.   The colon mucosa was otherwise normal.  Retroflexed views revealed no abnormalities. The time to cecum=1 minutes 35 seconds. Withdrawal time=15 minutes 57 seconds.  The scope was withdrawn and the procedure  completed. COMPLICATIONS: There were no complications.  ENDOSCOPIC IMPRESSION: Five sessile polyps measuring 2-10 mm in size were found at the cecum; polypectomy was performed with cold forceps and with a cold snare Diverticulosis in the right and left colon Otherwise normal, excellent prep  RECOMMENDATIONS: Timing of repeat colonoscopy will be determined by pathology findings in a patient with prior rectal adenoma w/ high-grade dysplasia (2004-5)  eSigned:  Iva Boop, MD, [facility] 08/03/2012 9:49 AM   cc: The Patient

## 2012-08-04 ENCOUNTER — Telehealth: Payer: Self-pay | Admitting: *Deleted

## 2012-08-04 NOTE — Telephone Encounter (Signed)
  Follow up Call-  Call back number 08/03/2012  Post procedure Call Back phone  # [phone]  Permission to leave phone message Yes     Patient questions:  Do you have a fever, pain , or abdominal swelling? no Pain Score  0 *  Have you tolerated food without any problems? yes  Have you been able to return to your normal activities? yes  Do you have any questions about your discharge instructions: Diet   no Medications  no Follow up visit  no  Do you have questions or concerns about your Care? no  Actions: * If pain score is 4 or above: No action needed, pain <4.

## 2012-08-08 ENCOUNTER — Other Ambulatory Visit: Payer: BC Managed Care – PPO

## 2012-08-09 ENCOUNTER — Encounter: Payer: Self-pay | Admitting: Internal Medicine

## 2012-08-09 NOTE — Progress Notes (Signed)
Quick Note:  2-3 colon adenomas max 10 mm Fundic gland gastric polyps  Repeat colonoscopy about 07/2015 ______

## 2012-08-22 ENCOUNTER — Other Ambulatory Visit: Payer: BC Managed Care – PPO

## 2012-08-30 ENCOUNTER — Encounter: Payer: Self-pay | Admitting: Internal Medicine

## 2012-08-30 ENCOUNTER — Ambulatory Visit (INDEPENDENT_AMBULATORY_CARE_PROVIDER_SITE_OTHER): Payer: BC Managed Care – PPO | Admitting: Internal Medicine

## 2012-08-30 VITALS — BP 162/84 | HR 64 | Temp 98.2°F | Ht 70.0 in | Wt 256.0 lb

## 2012-08-30 DIAGNOSIS — E039 Hypothyroidism, unspecified: Secondary | ICD-10-CM

## 2012-08-30 DIAGNOSIS — K219 Gastro-esophageal reflux disease without esophagitis: Secondary | ICD-10-CM

## 2012-08-30 DIAGNOSIS — R7309 Other abnormal glucose: Secondary | ICD-10-CM

## 2012-08-30 DIAGNOSIS — R739 Hyperglycemia, unspecified: Secondary | ICD-10-CM

## 2012-08-30 DIAGNOSIS — Z23 Encounter for immunization: Secondary | ICD-10-CM

## 2012-08-30 DIAGNOSIS — R1310 Dysphagia, unspecified: Secondary | ICD-10-CM

## 2012-08-30 DIAGNOSIS — I10 Essential (primary) hypertension: Secondary | ICD-10-CM

## 2012-08-30 DIAGNOSIS — Z0000 Encounter for general adult medical examination without abnormal findings: Secondary | ICD-10-CM

## 2012-08-30 MED ORDER — ACYCLOVIR 400 MG PO TABS: ORAL_TABLET | ORAL | Status: DC

## 2012-08-30 MED ORDER — LEVOTHYROXINE SODIUM 50 MCG PO TABS: 50.0000 ug | ORAL_TABLET | Freq: Every day | ORAL | Status: DC

## 2012-08-30 MED ORDER — OMEPRAZOLE 40 MG PO CPDR: 40.0000 mg | DELAYED_RELEASE_CAPSULE | Freq: Every day | ORAL | Status: DC

## 2012-08-30 MED ORDER — METOPROLOL SUCCINATE ER 50 MG PO TB24: 50.0000 mg | ORAL_TABLET | Freq: Every day | ORAL | Status: DC

## 2012-08-30 MED ORDER — AMLODIPINE-OLMESARTAN 10-40 MG PO TABS: 1.0000 | ORAL_TABLET | Freq: Every day | ORAL | Status: DC

## 2012-08-30 NOTE — Progress Notes (Signed)
Patient ID: [name], male   DOB: 03-20-1945, 67 y.o.   MRN: [id number]  cpx  Past Medical History  Diagnosis Date  . Gout   . HTN (hypertension)   . GI bleed 2005    after colonoscopy  . Hyperlipidemia   . GERD (gastroesophageal reflux disease)   . Personal history of rectal adenoma with high-grade dysplasia and colonic adenomas   . Benign fundic gland polyps of stomach     History   Social History  . Marital Status: Married    Spouse Name: N/A    Number of Children: 2  . Years of Education: N/A   Occupational History  .     Social History Main Topics  . Smoking status: Never Smoker   . Smokeless tobacco: Never Used  . Alcohol Use: 5.0 oz/week    10 drink(s) per week     Comment: Weekends only   . Drug Use: No  . Sexually Active: Not on file   Other Topics Concern  . Not on file   Social History Narrative   Daily caffeine     Past Surgical History  Procedure Laterality Date  . Tumor removal  1984    from Jaw  . Knee arthroscopy  2011    Dr. Shelle Iron  . Total knee arthroplasty  6/11    Dr. Shelle Iron  . Colonoscopy    . Upper gastrointestinal endoscopy      Family History  Problem Relation Age of Onset  . Stroke Mother   . Diabetes Mother   . Colon cancer Neg Hx     Allergies  Allergen Reactions  . Ace Inhibitors   . Atorvastatin     REACTION: breast tenderness  . Doxycycline     REACTION: stomach cramping. flatus    Current Outpatient Prescriptions on File Prior to Visit  Medication Sig Dispense Refill  . acyclovir (ZOVIRAX) 400 MG tablet TAKE 1 TABLET 5 TIMES A DAY AS NEEDED FOR COLD SORES  50 tablet  0  . amLODipine-olmesartan (AZOR) 10-40 MG per tablet Take 1 tablet by mouth daily.  90 tablet  0  . aspirin 81 MG tablet Take 81 mg by mouth daily.        Marland Kitchen b complex vitamins tablet Take 1 tablet by mouth daily.      . fish oil-omega-3 fatty acids 1000 MG capsule Take by mouth daily.      . Glucosamine-Chondroitin (GLUCOSAMINE CHONDR COMPLEX  PO) Take 1 tablet by mouth daily.      Marland Kitchen levothyroxine (SYNTHROID, LEVOTHROID) 50 MCG tablet Take 1 tablet (50 mcg total) by mouth daily.  90 tablet  0  . lidocaine (LINDAMANTLE) 3 % CREA cream Apply 1 application topically as needed.  85 g  0  . metoprolol succinate (TOPROL-XL) 50 MG 24 hr tablet Take 1 tablet (50 mg total) by mouth daily.  90 tablet  0  . Multiple Vitamin (MULTIVITAMIN) capsule Take 1 capsule by mouth daily.      . naproxen sodium (ANAPROX) 220 MG tablet Take 220 mg by mouth. 2 in the am 1 in the pm      . nitroGLYCERIN (NITROSTAT) 0.4 MG SL tablet Place 0.4 mg under the tongue every 5 (five) minutes as needed.      Marland Kitchen omeprazole (PRILOSEC) 40 MG capsule Take 1 capsule (40 mg total) by mouth daily before breakfast.  30 capsule  11  . vitamin C (ASCORBIC ACID) 500 MG tablet Take 500 mg  by mouth daily.       No current facility-administered medications on file prior to visit.     patient denies chest pain, shortness of breath, orthopnea. Denies lower extremity edema, abdominal pain, change in appetite, change in bowel movements. Patient denies rashes, musculoskeletal complaints. No other specific complaints in a complete review of systems.   BP 162/84  Pulse 64  Temp(Src) 98.2 F (36.8 C) (Oral)  Ht 5\' 10"  (1.778 m)  Wt 256 lb (116.121 kg)  BMI 36.73 kg/m2  well-developed well-nourished male in no acute distress. HEENT exam atraumatic, normocephalic, neck supple without jugular venous distention. Chest clear to auscultation cardiac exam S1-S2 are regular. Abdominal exam overweight with bowel sounds, soft and nontender. Extremities no edema. Neurologic exam is alert with a normal gait.  Well Visit- health Maint UTD. He understands the need to lose weight.

## 2012-10-03 ENCOUNTER — Encounter: Payer: Self-pay | Admitting: Family Medicine

## 2012-10-03 ENCOUNTER — Ambulatory Visit (INDEPENDENT_AMBULATORY_CARE_PROVIDER_SITE_OTHER): Payer: BC Managed Care – PPO | Admitting: Family Medicine

## 2012-10-03 ENCOUNTER — Telehealth: Payer: Self-pay | Admitting: Internal Medicine

## 2012-10-03 VITALS — BP 140/80 | Temp 98.6°F | Wt 260.0 lb

## 2012-10-03 DIAGNOSIS — J329 Chronic sinusitis, unspecified: Secondary | ICD-10-CM

## 2012-10-03 MED ORDER — HYDROCODONE-HOMATROPINE 5-1.5 MG/5ML PO SYRP: 5.0000 mL | ORAL_SOLUTION | Freq: Three times a day (TID) | ORAL | Status: DC | PRN

## 2012-10-03 MED ORDER — AMOXICILLIN 875 MG PO TABS: 875.0000 mg | ORAL_TABLET | Freq: Two times a day (BID) | ORAL | Status: DC

## 2012-10-03 NOTE — Progress Notes (Signed)
Chief Complaint  Patient presents with  . Cough    x 2 weeks     HPI:  [name] is a 67 yo pt of Dr. Cato Mulligan here for an acute visit for cough: -started: 2 weeks ago, cough medication didn't help -symptoms: productive cough, drainage in throat, sneezing, pressure in ears, pressure and pain in sinuses -denies: fevers, wheezing, SOB, NVD -has tried: robitussin -hx of: sinus infections  ROS: See pertinent positives and negatives per HPI.  Past Medical History  Diagnosis Date  . Gout   . HTN (hypertension)   . GI bleed 2005    after colonoscopy  . Hyperlipidemia   . GERD (gastroesophageal reflux disease)   . Personal history of rectal adenoma with high-grade dysplasia and colonic adenomas   . Benign fundic gland polyps of stomach     Family History  Problem Relation Age of Onset  . Stroke Mother   . Diabetes Mother   . Colon cancer Neg Hx     History   Social History  . Marital Status: Married    Spouse Name: N/A    Number of Children: 2  . Years of Education: N/A   Occupational History  .     Social History Main Topics  . Smoking status: Never Smoker   . Smokeless tobacco: Never Used  . Alcohol Use: 5.0 oz/week    10 drink(s) per week     Comment: Weekends only   . Drug Use: No  . Sexual Activity: None   Other Topics Concern  . None   Social History Narrative   Daily caffeine     Current outpatient prescriptions:acyclovir (ZOVIRAX) 400 MG tablet, TAKE 1 TABLET 5 TIMES A DAY AS NEEDED FOR COLD SORES, Disp: 50 tablet, Rfl: 3;  amLODipine-olmesartan (AZOR) 10-40 MG per tablet, Take 1 tablet by mouth daily., Disp: 90 tablet, Rfl: 3;  aspirin 81 MG tablet, Take 81 mg by mouth daily.  , Disp: , Rfl: ;  b complex vitamins tablet, Take 1 tablet by mouth daily., Disp: , Rfl:  fish oil-omega-3 fatty acids 1000 MG capsule, Take by mouth daily., Disp: , Rfl: ;  Glucosamine-Chondroitin (GLUCOSAMINE CHONDR COMPLEX PO), Take 1 tablet by mouth daily., Disp: , Rfl:  ;  levothyroxine (SYNTHROID, LEVOTHROID) 50 MCG tablet, Take 1 tablet (50 mcg total) by mouth daily., Disp: 90 tablet, Rfl: 3;  lidocaine (LINDAMANTLE) 3 % CREA cream, Apply 1 application topically as needed., Disp: 85 g, Rfl: 0 metoprolol succinate (TOPROL-XL) 50 MG 24 hr tablet, Take 1 tablet (50 mg total) by mouth daily., Disp: 90 tablet, Rfl: 3;  Multiple Vitamin (MULTIVITAMIN) capsule, Take 1 capsule by mouth daily., Disp: , Rfl: ;  naproxen sodium (ANAPROX) 220 MG tablet, Take 220 mg by mouth. 2 in the am 1 in the pm, Disp: , Rfl: ;  nitroGLYCERIN (NITROSTAT) 0.4 MG SL tablet, Place 0.4 mg under the tongue every 5 (five) minutes as needed., Disp: , Rfl:  omeprazole (PRILOSEC) 40 MG capsule, Take 1 capsule (40 mg total) by mouth daily before breakfast., Disp: 90 capsule, Rfl: 3;  vitamin C (ASCORBIC ACID) 500 MG tablet, Take 500 mg by mouth daily., Disp: , Rfl: ;  amoxicillin (AMOXIL) 875 MG tablet, Take 1 tablet (875 mg total) by mouth 2 (two) times daily., Disp: 20 tablet, Rfl: 0 HYDROcodone-homatropine (HYCODAN) 5-1.5 MG/5ML syrup, Take 5 mL by mouth every 8 (eight) hours as needed for cough., Disp: 120 mL, Rfl: 0  EXAM:  Filed  Vitals:   10/03/12 1420  BP: 140/80  Temp: 98.6 F (37 C)    Body mass index is 37.31 kg/(m^2).  GENERAL: vitals reviewed and listed above, alert, oriented, appears well hydrated and in no acute distress  HEENT: atraumatic, conjunttiva clear, no obvious abnormalities on inspection of external nose and ears, normal appearance of ear canals and TMs except for clear effusion L, white nasal congestion, mild post oropharyngeal erythema with PND, no tonsillar edema or exudate, no sinus TTP  NECK: no obvious masses on inspection  LUNGS: clear to auscultation bilaterally, no wheezes, rales or rhonchi, good air movement  CV: HRRR, no peripheral edema  MS: moves all extremities without noticeable abnormality  PSYCH: pleasant and cooperative, no obvious depression or  anxiety  ASSESSMENT AND PLAN:  Discussed the following assessment and plan:  Sinusitis - Plan: amoxicillin (AMOXIL) 875 MG tablet, HYDROcodone-homatropine (HYCODAN) 5-1.5 MG/5ML syrup  -persistent symptoms with sinus pain - abx per orders, risks and return precuations discussed -cough medication - risks discussed -Patient advised to return or notify a doctor immediately if symptoms worsen or persist or new concerns arise.  There are no Patient Instructions on file for this visit.   Kriste Basque R.

## 2012-10-03 NOTE — Telephone Encounter (Signed)
Patient Information:  Caller Name: [place]  Phone: [phone]  Patient: [name], [name]  Gender: Male  DOB: January 08, 1946  Age: 66 Years  PCP: Birdie Sons (Adults only)  Office Follow Up:  Does the office need to follow up with this patient?: No  Instructions For The Office: N/A  RN Note:  Pt has had productive Cough, yellow to green sputum.  Pt has had no improvement after using Robitussin DM per Dr Cato Mulligan' advice on 7-16.  Pt travels extensively and would like to know if Dr Cato Mulligan would call him in RX due to Cough interferring w/ his work.  Appt scheduled. Dr Cato Mulligan is not available.  Symptoms  Reason For Call & Symptoms: Cough  Reviewed Health History In EMR: Yes  Reviewed Medications In EMR: Yes  Reviewed Allergies In EMR: Yes  Reviewed Surgeries / Procedures: Yes  Date of Onset of Symptoms: 09/19/2012  Treatments Tried: Robitussin w/ DM  Treatments Tried Worked: No  Guideline(s) Used:  Cough  Disposition Per Guideline:   See Today or Tomorrow in Office  Reason For Disposition Reached:   Continuous (nonstop) coughing interferes with work or school and no improvement using cough treatment per Care Advice  Advice Given:  N/A  Patient Will Follow Care Advice:  YES  Appointment Scheduled:  10/03/2012 14:15:00 Appointment Scheduled Provider:  Kriste Basque (Family Practice)

## 2012-10-03 NOTE — Patient Instructions (Signed)
-  amoxicillin per instruction --As we discussed, we have prescribed a new medication for you at this appointment. We discussed the common and serious potential adverse effects of this medication and you can review these and more with the pharmacist when you pick up your medication.  Please follow the instructions for use carefully and notify us immediately if you have any problems taking this medication.  -cough medicine at night if needed  -afrin for 3-4 days then STOP  -follow up with your doctor if persistent

## 2013-03-05 ENCOUNTER — Other Ambulatory Visit: Payer: BC Managed Care – PPO

## 2013-03-23 ENCOUNTER — Other Ambulatory Visit (INDEPENDENT_AMBULATORY_CARE_PROVIDER_SITE_OTHER): Payer: BC Managed Care – PPO

## 2013-03-23 DIAGNOSIS — R739 Hyperglycemia, unspecified: Secondary | ICD-10-CM

## 2013-03-23 DIAGNOSIS — R7309 Other abnormal glucose: Secondary | ICD-10-CM

## 2013-03-23 LAB — LIPID PANEL
CHOLESTEROL: 219 mg/dL — AB (ref 0–200)
HDL: 42.3 mg/dL (ref >39.00)
Total CHOL/HDL Ratio: 5
Triglycerides: 179 mg/dL — ABNORMAL HIGH (ref 0.0–149.0)
VLDL: 35.8 mg/dL (ref 0.0–40.0)

## 2013-03-23 LAB — BASIC METABOLIC PANEL
BUN: 17 mg/dL (ref 6–23)
CO2: 28 meq/L (ref 19–32)
Calcium: 9 mg/dL (ref 8.4–10.5)
Chloride: 102 mEq/L (ref 96–112)
Creatinine, Ser: 1.1 mg/dL (ref 0.4–1.5)
GFR: 73.94 mL/min (ref >60.00)
Glucose, Bld: 99 mg/dL (ref 70–99)
Potassium: 3.7 mEq/L (ref 3.5–5.1)
Sodium: 138 mEq/L (ref 135–145)

## 2013-03-23 LAB — HEMOGLOBIN A1C: Hgb A1c MFr Bld: 6.2 % (ref 4.6–6.5)

## 2013-03-26 LAB — LDL CHOLESTEROL, DIRECT: Direct LDL: 145.7 mg/dL

## 2013-06-06 ENCOUNTER — Telehealth: Payer: Self-pay | Admitting: Internal Medicine

## 2013-06-06 NOTE — Telephone Encounter (Signed)
Pt left a physician release form to be filled out by Dr Leanne Chang. Dr Maxie Better office will not schedule surgery appt till they receive the form

## 2013-06-08 NOTE — Telephone Encounter (Signed)
Pt is going to come in and see [name] at 8 on Monday 06/11/13 for surgical clearance.  This was discussed and approved with [facility]

## 2013-06-11 ENCOUNTER — Ambulatory Visit (INDEPENDENT_AMBULATORY_CARE_PROVIDER_SITE_OTHER): Payer: BC Managed Care – PPO | Admitting: Physician Assistant

## 2013-06-11 ENCOUNTER — Encounter: Payer: Self-pay | Admitting: Physician Assistant

## 2013-06-11 VITALS — BP 132/70 | HR 64 | Temp 98.5°F | Resp 16 | Ht 70.5 in | Wt 245.0 lb

## 2013-06-11 DIAGNOSIS — Z01818 Encounter for other preprocedural examination: Secondary | ICD-10-CM

## 2013-06-11 NOTE — Patient Instructions (Signed)
You are cleared for Your Knee Arthroscopic procedure on Friday May 8th, 2015.  You need to Stop your Azor 24hr before your exam. Since you take this at Night time, you need to NOT take the dose you would take the night before your exam.  You need to Stop taking your Aspirin 7 days prior to your exam.   You need to Stop taking your NSAIDs (Aleve, Naproxen, or Ibuprofen, Advil/Motrin)   Your EKG today was the same as one you had done in 2012.  Follow up with clinic as needed.Outpatient Surgery Guidelines, Adult Outpatient procedures are those for which the person having the procedure is allowed to go home the same day as the procedure. Various procedures are done on an outpatient basis. You should follow some general guidelines if you will be having an outpatient procedure. LET [facility] CARE PROVIDER KNOW ABOUT:  Any allergies you have.  All medicines you are taking, including vitamins, herbs, eye drops, creams, and over-the-counter medicines.  Previous problems you or members of your family have had with the use of anesthetics.  Any blood disorders you have.  Previous surgeries you have had.  Medical conditions you have. RISKS AND COMPLICATIONS Your health care provider will discuss possible risks and complications with you before surgery. Common risks and complications include:   Problems due to the use of anesthetics.  Blood loss and replacement (does not apply to minor surgical procedures).  Temporary increase in pain due to surgery.  Uncorrected pain or problems that the surgery was meant to correct.  Infection.  New damage. BEFORE THE PROCEDURE  Ask your health care provider about changing or stopping your regular medicines. You may need to stop taking certain medicines in the days or weeks before the procedure.  Stop smoking at least 2 weeks before surgery. This lowers your risk for complications during and after surgery. Ask your health care provider for help with  this if needed.  Eat your usual meals and a light supper the day before surgery. Continue fluid intake. Do not drink alcohol.  Do not eat or drink after midnight the night before your surgery. Take your usual medicine the morning of surgery with a sip of water unless instructed otherwise. Check with your health care provider if you are unsure. This is particularly important if you take diabetes medicine.  Arrange for someone to take you home and to stay with you for 24 hours after the procedure. Medicine given for your procedure may affect your ability to drive or to care for yourself.  Call your health care provider's office if you develop an illness or problem that may prevent you from safely having your procedure. AFTER THE PROCEDURE After surgery, you will be taken to a recovery area, where your progress will be monitored. If there are no complications, you will be allowed to go home when you are awake, stable, and taking fluids well. You may have numbness around the surgical site. Healing will take some time. You will have tenderness at the surgical site and may have some swelling and bruising. You may also have some nausea. HOME CARE INSTRUCTIONS  Do not drive for 24 hours, or as directed by your health care provider. Do not drive while taking prescription pain medicines.  Do not drink alcohol for 24 hours.  Do not make important decisions or sign legal documents for 24 hours.  You may resume a normal diet and activities as directed.  Do not lift anything heavier than 10  pounds (4.5 kg) or play contact sports until your health care provider says it is okay.  Change your bandages (dressings) as directed.  Only take over-the-counter or prescription medicines as directed by your health care provider.  Follow up with your health care provider as directed. SEEK MEDICAL CARE IF:  You have increased bleeding (more than a small spot) from the surgical site.  You have redness, swelling,  or increasing pain in the wound.  You see pus coming from the wound.  You have a fever.  You notice a bad smell coming from the wound or dressing.  You feel lightheaded or faint.  You develop a rash.  You have trouble breathing.  You develop allergies. MAKE SURE YOU:  Understand these instructions.  Will watch your condition.  Will get help right away if you are not doing well or get worse. Document Released: 10/27/2000 Document Revised: 10/04/2012 Document Reviewed: 07/06/2012 [facility] Patient Information 2014 [place].

## 2013-06-11 NOTE — Progress Notes (Signed)
Subjective:    Patient ID: [name], male    DOB: 1945/07/18, 68 y.o.   MRN: [id number]  HPI Pt is a 68 y/o caucasian male presenting to the clinic today to obtain Pre-operative clearance for a knee arthroscopic procedure that is tentatively scheduled for 06/22/13. Pt has no complaints or concerns at this time.   Pt carries NTG with him, however he states he has had the medication for years and has never had to use it, and has never been diagnosed with CAD.  Pt has diagnosis of Hypertension controlled with medication, and is tolerating his medications well.  Pt has diagnosis of Hyperlipidemia, and is not on any medication for this.  Pre-Op Questions 1. Do you usually get chest pain or breathlessness when you climb up two flights of stairs at normal speed? NO 2. Do you have kidney disease? NO 3. Has anyone in your family (blood relatives) had a problem following an anaesthetic? NO 4. Have you ever had a heart attack? NO 5. Have you ever been diagnosed with an irregular heartbeat? NO 6. Have you ever had a stroke? NO 7. If you have been put to sleep for an operation were there any anaesthetic problems? NO 8. Do you suffer from epilepsy or seizures? NO 9. Do you have any problems with pain, stiffness or arthritis in your neck or jaw? 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 Do you have thyroid disease? NO 11. Do you suffer from angina? NO 12. Do you have liver disease? NO 13. Have you ever been diagnosed with heart failure? NO 14. Do you suffer from asthma? NO 15. Do you have diabetes that requires insulin? NO 16. Do you have diabetes that requires tablets only? NO 17. Do you suffer from bronchitis? NO   Review of Systems  Constitutional: Negative for fever, chills, diaphoresis, activity change, appetite change, fatigue and unexpected weight change.  HENT: Negative.   Eyes: Negative.   Respiratory: Negative for apnea, cough, choking, chest  tightness, shortness of breath, wheezing and stridor.   Cardiovascular: Negative for chest pain, palpitations and leg swelling.  Gastrointestinal: Negative for nausea, vomiting, abdominal pain, diarrhea, constipation, blood in stool, abdominal distention, anal bleeding and rectal pain.  Endocrine: Negative.   Genitourinary: Negative.   Musculoskeletal: Positive for arthralgias and joint swelling. Negative for back pain, gait problem, myalgias, neck pain and neck stiffness.  Skin: Negative.   Allergic/Immunologic: Negative.   Neurological: Negative for dizziness, tremors, seizures, syncope, facial asymmetry, speech difficulty, weakness, light-headedness, numbness and headaches.  Hematological: Negative for adenopathy. Does not bruise/bleed easily.  Psychiatric/Behavioral: Negative for suicidal ideas, hallucinations, behavioral problems, confusion, sleep disturbance, self-injury, dysphoric mood, decreased concentration and agitation. The patient is not nervous/anxious and is not hyperactive.    Past Medical History  Diagnosis Date  . Gout   . HTN (hypertension)   . GI bleed 2005    after colonoscopy  . Hyperlipidemia   . GERD (gastroesophageal reflux disease)   . Personal history of rectal adenoma with high-grade dysplasia and colonic adenomas   . Benign fundic gland polyps of stomach    Past Surgical History  Procedure Laterality Date  . Tumor removal  1984    from Jaw  . Knee arthroscopy  2011    Dr. Tonita Cong  . Total knee arthroplasty  6/11    Dr. Tonita Cong  . Colonoscopy    . Upper gastrointestinal endoscopy      reports  that he has never smoked. He has never used smokeless tobacco. He reports that he drinks about 5 ounces of alcohol per week. He reports that he does not use illicit drugs. family history includes Diabetes in his mother; Stroke in his mother. There is no history of Colon cancer. Allergies  Allergen Reactions  . Ace Inhibitors   . Atorvastatin     REACTION: breast  tenderness  . Doxycycline     REACTION: stomach cramping. flatus   No change in PFS history since last office visit on 08/30/2012.      Objective:   Physical Exam  Nursing note and vitals reviewed. Constitutional: He is oriented to person, place, and time. He appears well-developed and well-nourished. No distress.  HENT:  Head: Normocephalic and atraumatic.  Eyes: Conjunctivae and EOM are normal. Pupils are equal, round, and reactive to light.  Neck: Normal range of motion. Neck supple. No JVD present. No tracheal deviation present. No thyromegaly present.  Cardiovascular: Normal rate, regular rhythm, normal heart sounds and intact distal pulses.  Exam reveals no gallop and no friction rub.   No murmur heard. Pulmonary/Chest: Effort normal and breath sounds normal. No stridor. No respiratory distress. He has no wheezes. He has no rales. He exhibits no tenderness.  Abdominal: Soft. Bowel sounds are normal. He exhibits no distension and no mass. There is no tenderness. There is no rebound and no guarding.  Musculoskeletal: Normal range of motion. He exhibits no edema.  Lymphadenopathy:    He has no cervical adenopathy.  Neurological: He is alert and oriented to person, place, and time. He has normal reflexes. No cranial nerve deficit. He exhibits normal muscle tone. Coordination normal.  Skin: Skin is warm and dry. No rash noted. He is not diaphoretic. No erythema. No pallor.  Psychiatric: He has a normal mood and affect. His behavior is normal. Judgment and thought content normal.   Filed Vitals:   06/11/13 0817  BP: 132/70  Pulse: 64  Temp: 98.5 F (36.9 C)  Resp: 16   Lab Results  Component Value Date   WBC 6.6 07/27/2012   HGB 16.5 07/27/2012   HCT 48.1 07/27/2012   PLT 243.0 07/27/2012   GLUCOSE 99 03/23/2013   CHOL 219* 03/23/2013   TRIG 179.0* 03/23/2013   HDL 42.30 03/23/2013   LDLDIRECT 145.7 03/23/2013   ALT 21 07/27/2012   AST 19 07/27/2012   NA 138 03/23/2013   K 3.7 03/23/2013     CL 102 03/23/2013   CREATININE 1.1 03/23/2013   BUN 17 03/23/2013   CO2 28 03/23/2013   TSH 3.54 07/27/2012   PSA 0.92 07/27/2012   INR 1.00 07/27/2010   HGBA1C 6.2 03/23/2013   EKG: normal EKG, Sinus Bradycardia, unchanged from previous tracings.     Assessment & Plan:  Ovila was seen today for pre-op exam.  Diagnoses and associated orders for this visit:  Pre-op exam - EKG 12-Lead    Patient Instructions  You are cleared for Your Knee Arthroscopic procedure on Friday May 8th, 2015.  You need to Stop your Azor 24hr before your exam. Since you take this at Night time, you need to NOT take the dose you would take the night before your exam.  You need to Stop taking your Aspirin 7 days prior to your exam.   You need to Stop taking your NSAIDs (Aleve, Naproxen, or Ibuprofen, Advil/Motrin)   Your EKG today was the same as one you had done in 2012.  Follow  up with clinic as needed.

## 2013-06-11 NOTE — Progress Notes (Signed)
Pre visit review using our clinic review tool, if applicable. No additional management support is needed unless otherwise documented below in the visit note. 

## 2013-06-13 ENCOUNTER — Other Ambulatory Visit: Payer: Self-pay | Admitting: Orthopedic Surgery

## 2013-06-14 ENCOUNTER — Other Ambulatory Visit: Payer: Self-pay | Admitting: Orthopedic Surgery

## 2013-06-14 NOTE — H&P (Signed)
[name] is an 68 y.o. male.   Chief Complaint: left knee pain HPI: The patient is a 68 year old male who presents today for follow up of their knee. The patient is being followed for their left knee pain. They are now 3 1/2 months out from when symptoms began and 7 weeks out from a cortisone injection. Symptoms reported today include: pain (left knee and calf). and report their pain level to be moderate to severe. Current treatment includes: NSAIDs (Aleve) and icing. The patient presents today following MRI. The patient has reported symptom improvement with: Cortisone injections (short term relief) while they have not gotten any relief of their symptoms with: bracing.  [name] follows up. He has pain, swelling, locking and giving way of the knee. When he gets up, he has stiffness. He does not have severe pain when he's walking.  Past Medical History  Diagnosis Date  . Gout   . HTN (hypertension)   . GI bleed 2005    after colonoscopy  . Hyperlipidemia   . GERD (gastroesophageal reflux disease)   . Personal history of rectal adenoma with high-grade dysplasia and colonic adenomas   . Benign fundic gland polyps of stomach     Past Surgical History  Procedure Laterality Date  . Tumor removal  1984    from Jaw  . Knee arthroscopy  2011    Dr. Tonita Cong  . Total knee arthroplasty  6/11    Dr. Tonita Cong  . Colonoscopy    . Upper gastrointestinal endoscopy      Family History  Problem Relation Age of Onset  . Stroke Mother   . Diabetes Mother   . Colon cancer Neg Hx    Social History:  reports that he has never smoked. He has never used smokeless tobacco. He reports that he drinks about 5 ounces of alcohol per week. He reports that he does not use illicit drugs.  Allergies:  Allergies  Allergen Reactions  . Ace Inhibitors   . Atorvastatin     REACTION: breast tenderness  . Doxycycline     REACTION: stomach cramping. flatus     (Not in a hospital admission)  No  results found for this or any previous visit (from the past 48 hour(s)). No results found.  Review of Systems  Constitutional: Negative.   HENT: Negative.   Eyes: Negative.   Respiratory: Negative.   Cardiovascular: Negative.   Gastrointestinal: Negative.   Genitourinary: Negative.   Musculoskeletal: Positive for joint pain.  Skin: Negative.   Neurological: Negative.   Psychiatric/Behavioral: Negative.     There were no vitals taken for this visit. Physical Exam  Constitutional: He is oriented to person, place, and time. He appears well-developed and well-nourished.  HENT:  Head: Normocephalic and atraumatic.  Eyes: Conjunctivae and EOM are normal. Pupils are equal, round, and reactive to light.  Neck: Normal range of motion. Neck supple.  Cardiovascular: Normal rate and regular rhythm.   Respiratory: Effort normal and breath sounds normal.  GI: Soft. Bowel sounds are normal.  Musculoskeletal:  On exam, he has a mild to moderate effusion. He is tender posteromedially. Equivocal [place]. He has a mild discomfort and patellofemoral pain with compression. His range is -5 to 120 without instability. No DVT. Ipsilateral hip and ankle exam is unremarkable. His MRI demonstrates a complex tear of the posterior horn of the medial meniscus with a displaced flap fragment, severe patellofemoral arthrosis, popliteal cyst, prepatellar bursitis.  Neurological: He is alert  and oriented to person, place, and time. He has normal reflexes.  Skin: Skin is warm and dry.  Psychiatric: He has a normal mood and affect.    MRI with MMT, DJD, large popliteal cyst  Assessment/Plan L knee DJD, meniscus tear Refractory mechanical symptoms, left knee, multifactorial, degenerative arthrosis, medial compartment patellofemoral joint, complex tear of the medial meniscus, refractory to rest, activity modification, corticosteroid injection, home exercises.  We discussed options, living with symptoms,  arthroscopic partial meniscectomy and debridement vs. total knee. He does not have much in terms of bone marrow edema or severe weight bearing type pain, therefore I do feel that a total knee can be delayed. He may benefit by arthroscopic debridement and partial meniscectomy and temporizing measures followed by corticosteroid injection or Visco supplementation.  I had a long discussion with the patient concerning the risks and benefits of knee arthroscopy including help from the arthroscopic procedure as well as no help from the arthroscopic procedure or worsening of symptoms. Also discussed infection, DVT, PE, anesthetic complications, etc. Also discussed the possibility of repeat arthroscopic surgery required in the future or total knee replacement. I provided the patient with an illustrated handout and discussed it in detail as well as discussed the postoperative and perioperative courses and return to functional activities including work. Need for postoperative DVT prophylaxis was discussed as well.  They would like to think about that for awhile. In the interim, he has some upcoming challenges. We will proceed with a corticosteroid injection.  After discussing the risks and benefits of an injection, I sterilely prepped the knee with Betadine and alcohol, and under strict, sterile conditions injected the patient with 1 cc. of Aristospan and 8 cc. of 1% Lidocaine solution with reduction of pain. Post-injection instructions were given.  We spent a considerable amount of time discussing the limitations as well as the benefits of arthroscopic debridement vs. total knee. Given his other side, he most likely will require it in the future. At this time he is not demonstrating severe pain that I would say would be a definitive pre-requisite for that. He will contemplate that option and call back if he wants to proceed.  Plan L knee arthroscopy, partial meniscectomy, debridement  Conley Rolls. Lena Fieldhouse for  Dr. Tonita Cong 06/14/2013, 11:00 AM

## 2013-06-15 ENCOUNTER — Encounter (HOSPITAL_COMMUNITY): Payer: Self-pay | Admitting: Pharmacy Technician

## 2013-06-18 ENCOUNTER — Ambulatory Visit (INDEPENDENT_AMBULATORY_CARE_PROVIDER_SITE_OTHER): Payer: BC Managed Care – PPO | Admitting: Family

## 2013-06-18 ENCOUNTER — Encounter: Payer: Self-pay | Admitting: Family

## 2013-06-18 ENCOUNTER — Telehealth: Payer: Self-pay | Admitting: Internal Medicine

## 2013-06-18 VITALS — BP 138/84 | HR 75 | Temp 98.0°F | Ht 70.5 in | Wt 246.0 lb

## 2013-06-18 DIAGNOSIS — K5900 Constipation, unspecified: Secondary | ICD-10-CM

## 2013-06-18 DIAGNOSIS — E039 Hypothyroidism, unspecified: Secondary | ICD-10-CM

## 2013-06-18 LAB — CBC WITH DIFFERENTIAL/PLATELET
BASOS PCT: 0.3 % (ref 0.0–3.0)
Basophils Absolute: 0 10*3/uL (ref 0.0–0.1)
EOS ABS: 0.1 10*3/uL (ref 0.0–0.7)
Eosinophils Relative: 0.8 % (ref 0.0–5.0)
HCT: 44.8 % (ref 39.0–52.0)
HEMOGLOBIN: 15.3 g/dL (ref 13.0–17.0)
LYMPHS PCT: 11.1 % — AB (ref 12.0–46.0)
Lymphs Abs: 1.2 10*3/uL (ref 0.7–4.0)
MCHC: 34.2 g/dL (ref 30.0–36.0)
MCV: 93.3 fl (ref 78.0–100.0)
MONOS PCT: 10.6 % (ref 3.0–12.0)
Monocytes Absolute: 1.2 10*3/uL — ABNORMAL HIGH (ref 0.1–1.0)
NEUTROS PCT: 77.2 % — AB (ref 43.0–77.0)
Neutro Abs: 8.5 10*3/uL — ABNORMAL HIGH (ref 1.4–7.7)
Platelets: 239 10*3/uL (ref 150.0–400.0)
RBC: 4.8 Mil/uL (ref 4.22–5.81)
RDW: 13.2 % (ref 11.5–14.6)
WBC: 11 10*3/uL — ABNORMAL HIGH (ref 4.5–10.5)

## 2013-06-18 LAB — BASIC METABOLIC PANEL
BUN: 30 mg/dL — ABNORMAL HIGH (ref 6–23)
CALCIUM: 9.4 mg/dL (ref 8.4–10.5)
CO2: 25 meq/L (ref 19–32)
CREATININE: 1.9 mg/dL — AB (ref 0.4–1.5)
Chloride: 100 mEq/L (ref 96–112)
GFR: 38.85 mL/min — AB (ref >60.00)
Glucose, Bld: 86 mg/dL (ref 70–99)
Potassium: 4 mEq/L (ref 3.5–5.1)
SODIUM: 137 meq/L (ref 135–145)

## 2013-06-18 LAB — TSH: TSH: 4.52 u[IU]/mL (ref 0.35–5.50)

## 2013-06-18 NOTE — Patient Instructions (Signed)
1. Magnesium citrate OTC, drink 1 bottle  Constipation, Adult Constipation is when a person has fewer than 3 bowel movements a week; has difficulty having a bowel movement; or has stools that are dry, hard, or larger than normal. As people grow older, constipation is more common. If you try to fix constipation with medicines that make you have a bowel movement (laxatives), the problem may get worse. Long-term laxative use may cause the muscles of the colon to become weak. A low-fiber diet, not taking in enough fluids, and taking certain medicines may make constipation worse. CAUSES   Certain medicines, such as antidepressants, pain medicine, iron supplements, antacids, and water pills.   Certain diseases, such as diabetes, irritable bowel syndrome (IBS), thyroid disease, or depression.   Not drinking enough water.   Not eating enough fiber-rich foods.   Stress or travel.  Lack of physical activity or exercise.  Not going to the restroom when there is the urge to have a bowel movement.  Ignoring the urge to have a bowel movement.  Using laxatives too much. SYMPTOMS   Having fewer than 3 bowel movements a week.   Straining to have a bowel movement.   Having hard, dry, or larger than normal stools.   Feeling full or bloated.   Pain in the lower abdomen.  Not feeling relief after having a bowel movement. DIAGNOSIS  Your caregiver will take a medical history and perform a physical exam. Further testing may be done for severe constipation. Some tests may include:   A barium enema X-ray to examine your rectum, colon, and sometimes, your small intestine.  A sigmoidoscopy to examine your lower colon.  A colonoscopy to examine your entire colon. TREATMENT  Treatment will depend on the severity of your constipation and what is causing it. Some dietary treatments include drinking more fluids and eating more fiber-rich foods. Lifestyle treatments may include regular exercise.  If these diet and lifestyle recommendations do not help, your caregiver may recommend taking over-the-counter laxative medicines to help you have bowel movements. Prescription medicines may be prescribed if over-the-counter medicines do not work.  HOME CARE INSTRUCTIONS   Increase dietary fiber in your diet, such as fruits, vegetables, whole grains, and beans. Limit high-fat and processed sugars in your diet, such as [place] fries, hamburgers, cookies, candies, and soda.   A fiber supplement may be added to your diet if you cannot get enough fiber from foods.   Drink enough fluids to keep your urine clear or pale yellow.   Exercise regularly or as directed by your caregiver.   Go to the restroom when you have the urge to go. Do not hold it.  Only take medicines as directed by your caregiver. Do not take other medicines for constipation without talking to your caregiver first. [place] IF:   You have bright red blood in your stool.   Your constipation lasts for more than 4 days or gets worse.   You have abdominal or rectal pain.   You have thin, pencil-like stools.  You have unexplained weight loss. MAKE SURE YOU:   Understand these instructions.  Will watch your condition.  Will get help right away if you are not doing well or get worse. Document Released: 10/31/2003 Document Revised: 04/26/2011 Document Reviewed: 11/13/2012 [facility] Patient Information 2014 [place], [place].

## 2013-06-18 NOTE — Progress Notes (Signed)
Subjective:    Patient ID: [name], male    DOB: [birth year], 68 y.o.   MRN: [id number]  HPI  68 year old male, nonsmoker who presents today with complaints of constipation x4 days. Reports left lower quadrant pain that comes and goes. Has taken 2 suppositories and MiraLax without much relief. Has had difficulty with evacuation of the stool. Reports decreased appetite. Last colonoscopy one year ago. Has a history of hypothyroidism and is currently taking 50 mcg of Synthroid.  Review of Systems  Constitutional: Negative.   Respiratory: Negative.   Cardiovascular: Negative.   Gastrointestinal: Negative for blood in stool and anal bleeding.  Endocrine: Negative.   Genitourinary: Negative.   Musculoskeletal: Negative.   Skin: Negative.   Allergic/Immunologic: Negative.   Hematological: Negative.   Psychiatric/Behavioral: Negative.    Past Medical History  Diagnosis Date  . Gout   . HTN (hypertension)   . GI bleed 2005    after colonoscopy  . Hyperlipidemia   . GERD (gastroesophageal reflux disease)   . Personal history of rectal adenoma with high-grade dysplasia and colonic adenomas   . Benign fundic gland polyps of stomach     History   Social History  . Marital Status: Married    Spouse Name: N/A    Number of Children: 2  . Years of Education: N/A   Occupational History  .     Social History Main Topics  . Smoking status: Never Smoker   . Smokeless tobacco: Never Used  . Alcohol Use: 5.0 oz/week    10 drink(s) per week     Comment: Weekends only   . Drug Use: No  . Sexual Activity: Not on file   Other Topics Concern  . Not on file   Social History Narrative   Daily caffeine     Past Surgical History  Procedure Laterality Date  . Tumor removal  1984    from Jaw  . Knee arthroscopy  2011    Dr. Tonita Cong  . Total knee arthroplasty  6/11    Dr. Tonita Cong  . Colonoscopy    . Upper gastrointestinal endoscopy      Family History  Problem Relation Age  of Onset  . Stroke Mother   . Diabetes Mother   . Colon cancer Neg Hx     Allergies  Allergen Reactions  . Ace Inhibitors     Gout  . Atorvastatin     REACTION: breast tenderness  . Doxycycline     REACTION: stomach cramping. flatus    Current Outpatient Prescriptions on File Prior to Visit  Medication Sig Dispense Refill  . amLODipine-olmesartan (AZOR) 10-40 MG per tablet Take 1 tablet by mouth every evening.      Marland Kitchen aspirin EC 81 MG tablet Take 81 mg by mouth daily.      . fish oil-omega-3 fatty acids 1000 MG capsule Take 1 g by mouth daily.       Marland Kitchen levothyroxine (SYNTHROID, LEVOTHROID) 50 MCG tablet Take 50 mcg by mouth at bedtime.      . metoprolol succinate (TOPROL-XL) 50 MG 24 hr tablet Take 50 mg by mouth every evening. Take with or immediately following a meal.      . Multiple Vitamin (MULTIVITAMIN WITH MINERALS) TABS tablet Take 1 tablet by mouth daily.      . Naproxen Sod-Diphenhydramine (ALEVE PM) 220-25 MG TABS Take 1 tablet by mouth at bedtime.      . Naproxen Sodium (ALEVE) 220 MG CAPS Take  440 mg by mouth every morning.      . nitroGLYCERIN (NITROSTAT) 0.4 MG SL tablet Place 0.4 mg under the tongue every 5 (five) minutes as needed for chest pain.       Marland Kitchen omeprazole (PRILOSEC) 40 MG capsule Take 40 mg by mouth daily.      . Probiotic Product (PROBIOTIC DAILY PO) Take 1 tablet by mouth daily.      . vitamin C (ASCORBIC ACID) 500 MG tablet Take 500 mg by mouth daily.      Marland Kitchen VITAMIN E PO Take 1 capsule by mouth daily.       No current facility-administered medications on file prior to visit.    BP 138/84  Pulse 75  Temp(Src) 98 F (36.7 C) (Oral)  Ht 5' 10.5" (1.791 m)  Wt 246 lb (111.585 kg)  BMI 34.79 kg/m2  SpO2 99%chart    Objective:   Physical Exam  Constitutional: He is oriented to person, place, and time. He appears well-developed and well-nourished.  HENT:  Right Ear: External ear normal.  Left Ear: External ear normal.  Nose: Nose normal.    Mouth/Throat: Oropharynx is clear and moist.  Neck: Normal range of motion. Neck supple. No thyromegaly present.  Cardiovascular: Normal rate, regular rhythm and normal heart sounds.   Pulmonary/Chest: Effort normal and breath sounds normal.  Abdominal: Soft. Bowel sounds are normal. He exhibits no distension. There is no rebound and no guarding.  Left lower quadrant tenderness. No palpable masses  Neurological: He is alert and oriented to person, place, and time.  Skin: Skin is warm and dry.  Psychiatric: He has a normal mood and affect.          Assessment & Plan:  Marsha was seen today for constipation.  Diagnoses and associated orders for this visit:  Unspecified constipation - Basic Metabolic Panel - CBC with Differential  Unspecified hypothyroidism - TSH - Basic Metabolic Panel - CBC with Differential    holly office with any questions or concerns. Advised MiraLax over-the-counter one bottle by mouth x1. Call with any issues or concerns.

## 2013-06-18 NOTE — Telephone Encounter (Signed)
Patient Information:  Caller Name: [place]  Phone: [phone]  Patient: [name], [name]  Gender: Male  DOB: 03/14/1945  Age: 68 Years  PCP: Phoebe Sharps (Adults only)  Office Follow Up:  Does the office need to follow up with this patient?: No  Instructions For The Office: N/A  RN Note:  Patient provided appt time today 06/18/13 with Abby Potash campbell at 11:15 for care and evaluation.  Symptoms  Reason For Call & Symptoms: Patient reports constipation since last Thursday 4/30 /15.  He has tried suppository Friday morning , saturday morning (5/1 , 5/2 and 5/3)  and last night .  He has also tried Metamucil  and Herbalax that he takes every day.  +abdominal discomfort and pain with movement and gas. Last night he passed small amount of stool , "size of his thumb". Unable to sleep and rest.  Reviewed Health History In EMR: Yes  Reviewed Medications In EMR: Yes  Reviewed Allergies In EMR: Yes  Reviewed Surgeries / Procedures: Yes  Date of Onset of Symptoms: 06/14/2013  Treatments Tried: suppository, metamucil  Treatments Tried Worked: No  Guideline(s) Used:  Constipation  Disposition Per Guideline:   See Today in Office  Reason For Disposition Reached:   Last bowel movement (BM) > 4 days ago  Advice Given:  Call Back If:  You become worse  Patient Will Follow Care Advice:  YES  Appointment Scheduled:  06/18/2013 11:15:00 Appointment Scheduled Provider:  Roxy Cedar [facility])

## 2013-06-18 NOTE — Progress Notes (Signed)
Pre visit review using our clinic review tool, if applicable. No additional management support is needed unless otherwise documented below in the visit note. 

## 2013-06-18 NOTE — Telephone Encounter (Signed)
Noted  

## 2013-06-20 ENCOUNTER — Telehealth: Payer: Self-pay | Admitting: Internal Medicine

## 2013-06-20 NOTE — Telephone Encounter (Signed)
Pt returned your call said he could not understand your message would like call back  [phone]

## 2013-06-20 NOTE — Telephone Encounter (Signed)
Pt aware of results 

## 2013-06-22 ENCOUNTER — Encounter (HOSPITAL_COMMUNITY)
Admission: RE | Admit: 2013-06-22 | Discharge: 2013-06-22 | Disposition: A | Discharge status: Home / Self Care | Payer: BC Managed Care – PPO | Source: Ambulatory Visit | Attending: Specialist | Admitting: Specialist

## 2013-06-22 ENCOUNTER — Ambulatory Visit (HOSPITAL_COMMUNITY)
Admission: RE | Admit: 2013-06-22 | Discharge: 2013-06-22 | Disposition: A | Discharge status: Home / Self Care | Payer: BC Managed Care – PPO | Source: Ambulatory Visit | Attending: Anesthesiology | Admitting: Anesthesiology

## 2013-06-22 ENCOUNTER — Encounter (HOSPITAL_COMMUNITY): Payer: Self-pay

## 2013-06-22 DIAGNOSIS — Z01818 Encounter for other preprocedural examination: Secondary | ICD-10-CM | POA: Insufficient documentation

## 2013-06-22 DIAGNOSIS — Z01812 Encounter for preprocedural laboratory examination: Secondary | ICD-10-CM | POA: Insufficient documentation

## 2013-06-22 HISTORY — DX: Other specified symptoms and signs involving the digestive system and abdomen: R19.8

## 2013-06-22 NOTE — Patient Instructions (Addendum)
20 [name]  06/22/2013   Your procedure is scheduled on:5-15   -2015  Enter through [facility] Entrance and follow signs to [place]. Arrive at  0730      AM.  Call this number if you have problems the morning of surgery: [phone]  Or Presurgical Testing [phone](Diana Armijo) For Living Will and/or Health Care Power Attorney Forms: please provide copy for your medical record,may bring AM of surgery(Forms should be already notarized -we do not provide this service).( No information preferred today).     Do not eat food:After Midnight.  .  Take these medicines the morning of surgery with A SIP OF WATER: takes usual PM meds to inculde Metoprolol.   Do not wear jewelry, make-up or nail polish.  Do not wear lotions, powders, or perfumes. You may wear deodorant.  Do not shave 48 hours(2 days) prior to first CHG shower(legs and under arms).(Shaving face and neck okay.)  Do not bring valuables to the hospital.(Hospital is not responsible for lost valuables).  Contacts, dentures or removable bridgework, body piercing, hair pins may not be worn into surgery.  Leave suitcase in the car. After surgery it may be brought to your room.  For patients admitted to the hospital, checkout time is 11:00 AM the day of discharge.(Restricted visitors-Any Persons displaying flu-like symptoms or illness).    Patients discharged the day of surgery will not be allowed to drive home. Must have responsible person with you x 24 hours once discharged.  Name and phone number of your driver: Tarquin Welcher -spouse [phone] cell  Special Instructions: CHG(Chlorhedine 4%-"Hibiclens","Betasept","Aplicare") Shower Use Special Wash: see special instructions.(avoid face and genitals)   Please read over the following fact sheets that you were given:  Incentive Spirometry Instruction.  Remember : Type/Screen "Blue armbands" - may not be removed once applied(would result in being retested AM of  surgery, if removed).  Failure to follow these instructions may result in Cancellation of your surgery.   Patient signature_______________________________________________________

## 2013-06-22 NOTE — Pre-Procedure Instructions (Signed)
06-22-13 CBC/d, BMP 06-18-13- Epic, EKG 06-11-13 Epic. CXR done today. Pt. To have additional labs 06-28-13 with Dr. Lacinda Axon try to retrieve also when available. 06-22-13 Dr. Leanne Chang sent fax -pt. Stop/Bang score = 4 today.

## 2013-06-22 NOTE — Progress Notes (Signed)
Your Pt has screened with an elevated risk for obstructive sleep apnea using the Stop-Bang tool during a presurgical  Visit. A score of four or greater is an elevated risk. 

## 2013-06-28 ENCOUNTER — Other Ambulatory Visit (INDEPENDENT_AMBULATORY_CARE_PROVIDER_SITE_OTHER): Payer: BC Managed Care – PPO

## 2013-06-28 DIAGNOSIS — I10 Essential (primary) hypertension: Secondary | ICD-10-CM

## 2013-06-28 LAB — BASIC METABOLIC PANEL
BUN: 19 mg/dL (ref 6–23)
CALCIUM: 9.2 mg/dL (ref 8.4–10.5)
CO2: 30 meq/L (ref 19–32)
Chloride: 102 mEq/L (ref 96–112)
Creatinine, Ser: 1.4 mg/dL (ref 0.4–1.5)
GFR: 52.72 mL/min — ABNORMAL LOW (ref >60.00)
Glucose, Bld: 116 mg/dL — ABNORMAL HIGH (ref 70–99)
POTASSIUM: 3.9 meq/L (ref 3.5–5.1)
Sodium: 139 mEq/L (ref 135–145)

## 2013-06-29 ENCOUNTER — Ambulatory Visit (HOSPITAL_COMMUNITY): Payer: BC Managed Care – PPO | Admitting: *Deleted

## 2013-06-29 ENCOUNTER — Encounter (HOSPITAL_COMMUNITY): Payer: BC Managed Care – PPO | Admitting: *Deleted

## 2013-06-29 ENCOUNTER — Ambulatory Visit (HOSPITAL_COMMUNITY)
Admission: RE | Admit: 2013-06-29 | Discharge: 2013-06-29 | Disposition: A | Discharge status: Home / Self Care | Payer: BC Managed Care – PPO | Source: Ambulatory Visit | Attending: Specialist | Admitting: Specialist

## 2013-06-29 ENCOUNTER — Encounter (HOSPITAL_COMMUNITY)
Admission: RE | Disposition: A | Discharge status: Home / Self Care | Payer: Self-pay | Source: Ambulatory Visit | Attending: Specialist

## 2013-06-29 ENCOUNTER — Encounter (HOSPITAL_COMMUNITY): Payer: Self-pay | Admitting: *Deleted

## 2013-06-29 DIAGNOSIS — I10 Essential (primary) hypertension: Secondary | ICD-10-CM | POA: Insufficient documentation

## 2013-06-29 DIAGNOSIS — M1712 Unilateral primary osteoarthritis, left knee: Secondary | ICD-10-CM

## 2013-06-29 DIAGNOSIS — M23305 Other meniscus derangements, unspecified medial meniscus, unspecified knee: Secondary | ICD-10-CM | POA: Insufficient documentation

## 2013-06-29 DIAGNOSIS — K219 Gastro-esophageal reflux disease without esophagitis: Secondary | ICD-10-CM | POA: Insufficient documentation

## 2013-06-29 DIAGNOSIS — M1710 Unilateral primary osteoarthritis, unspecified knee: Secondary | ICD-10-CM | POA: Insufficient documentation

## 2013-06-29 HISTORY — PX: KNEE ARTHROSCOPY WITH MEDIAL MENISECTOMY: SHX5651

## 2013-06-29 SURGERY — ARTHROSCOPY, KNEE, WITH MEDIAL MENISCECTOMY
Anesthesia: General | Site: Knee | Laterality: Left

## 2013-06-29 MED ORDER — LACTATED RINGERS IV SOLN
INTRAVENOUS | Status: DC
  Administered 2013-06-29 (×2): via INTRAVENOUS

## 2013-06-29 MED ORDER — EPINEPHRINE HCL 1 MG/ML IJ SOLN
INTRAMUSCULAR | Status: DC | PRN
  Administered 2013-06-29 (×2): 1 mg

## 2013-06-29 MED ORDER — PHENYLEPHRINE HCL 10 MG/ML IJ SOLN
INTRAMUSCULAR | Status: DC | PRN
  Administered 2013-06-29 (×3): 80 ug via INTRAVENOUS

## 2013-06-29 MED ORDER — ONDANSETRON HCL 4 MG/2ML IJ SOLN
INTRAMUSCULAR | Status: AC
  Filled 2013-06-29: qty 2

## 2013-06-29 MED ORDER — FENTANYL CITRATE 0.05 MG/ML IJ SOLN
INTRAMUSCULAR | Status: AC
  Filled 2013-06-29: qty 5

## 2013-06-29 MED ORDER — HYDROCODONE-ACETAMINOPHEN 5-325 MG PO TABS: 1.0000 | ORAL_TABLET | ORAL | Status: DC | PRN

## 2013-06-29 MED ORDER — FENTANYL CITRATE 0.05 MG/ML IJ SOLN: 25.0000 ug | INTRAMUSCULAR | Status: DC | PRN

## 2013-06-29 MED ORDER — FENTANYL CITRATE 0.05 MG/ML IJ SOLN
INTRAMUSCULAR | Status: DC | PRN
  Administered 2013-06-29: 50 ug via INTRAVENOUS
  Administered 2013-06-29: 25 ug via INTRAVENOUS
  Administered 2013-06-29: 50 ug via INTRAVENOUS

## 2013-06-29 MED ORDER — CEFAZOLIN SODIUM-DEXTROSE 2-3 GM-% IV SOLR
2.0000 g | INTRAVENOUS | Status: AC
  Administered 2013-06-29: 2 g via INTRAVENOUS

## 2013-06-29 MED ORDER — ONDANSETRON HCL 4 MG/2ML IJ SOLN
INTRAMUSCULAR | Status: DC | PRN
  Administered 2013-06-29: 4 mg via INTRAVENOUS

## 2013-06-29 MED ORDER — MIDAZOLAM HCL 5 MG/5ML IJ SOLN
INTRAMUSCULAR | Status: DC | PRN
  Administered 2013-06-29: 2 mg via INTRAVENOUS

## 2013-06-29 MED ORDER — EPINEPHRINE HCL 1 MG/ML IJ SOLN
INTRAMUSCULAR | Status: AC
  Filled 2013-06-29: qty 2

## 2013-06-29 MED ORDER — DOCUSATE SODIUM 100 MG PO CAPS: 100.0000 mg | ORAL_CAPSULE | Freq: Two times a day (BID) | ORAL | Status: DC | PRN

## 2013-06-29 MED ORDER — KETAMINE HCL 10 MG/ML IJ SOLN
INTRAMUSCULAR | Status: DC | PRN
  Administered 2013-06-29: 50 mg via INTRAVENOUS
  Administered 2013-06-29: 60 mg via INTRAVENOUS

## 2013-06-29 MED ORDER — METOCLOPRAMIDE HCL 5 MG/ML IJ SOLN
INTRAMUSCULAR | Status: DC | PRN
  Administered 2013-06-29: 10 mg via INTRAVENOUS

## 2013-06-29 MED ORDER — DEXAMETHASONE SODIUM PHOSPHATE 4 MG/ML IJ SOLN
INTRAMUSCULAR | Status: DC | PRN
  Administered 2013-06-29: 10 mg via INTRAVENOUS

## 2013-06-29 MED ORDER — PHENYLEPHRINE 40 MCG/ML (10ML) SYRINGE FOR IV PUSH (FOR BLOOD PRESSURE SUPPORT)
PREFILLED_SYRINGE | INTRAVENOUS | Status: AC
  Filled 2013-06-29: qty 10

## 2013-06-29 MED ORDER — METOCLOPRAMIDE HCL 5 MG/ML IJ SOLN
INTRAMUSCULAR | Status: AC
  Filled 2013-06-29: qty 2

## 2013-06-29 MED ORDER — DEXAMETHASONE SODIUM PHOSPHATE 10 MG/ML IJ SOLN
INTRAMUSCULAR | Status: AC
  Filled 2013-06-29: qty 1

## 2013-06-29 MED ORDER — LACTATED RINGERS IR SOLN
Status: DC | PRN
  Administered 2013-06-29 (×2): 3000 mL

## 2013-06-29 MED ORDER — GLYCOPYRROLATE 0.2 MG/ML IJ SOLN
INTRAMUSCULAR | Status: AC
  Filled 2013-06-29: qty 2

## 2013-06-29 MED ORDER — MIDAZOLAM HCL 2 MG/2ML IJ SOLN
INTRAMUSCULAR | Status: AC
  Filled 2013-06-29: qty 2

## 2013-06-29 MED ORDER — EPHEDRINE SULFATE 50 MG/ML IJ SOLN
INTRAMUSCULAR | Status: AC
  Filled 2013-06-29: qty 1

## 2013-06-29 MED ORDER — EPHEDRINE SULFATE 50 MG/ML IJ SOLN
INTRAMUSCULAR | Status: DC | PRN
  Administered 2013-06-29 (×2): 10 mg via INTRAVENOUS

## 2013-06-29 MED ORDER — GLYCOPYRROLATE 0.2 MG/ML IJ SOLN
INTRAMUSCULAR | Status: DC | PRN
  Administered 2013-06-29 (×4): 0.1 mg via INTRAVENOUS

## 2013-06-29 MED ORDER — BUPIVACAINE-EPINEPHRINE 0.5% -1:200000 IJ SOLN
INTRAMUSCULAR | Status: DC | PRN
  Administered 2013-06-29: 20 mL

## 2013-06-29 MED ORDER — PROMETHAZINE HCL 25 MG/ML IJ SOLN: 6.2500 mg | INTRAMUSCULAR | Status: DC | PRN

## 2013-06-29 MED ORDER — CEFAZOLIN SODIUM-DEXTROSE 2-3 GM-% IV SOLR
INTRAVENOUS | Status: AC
  Filled 2013-06-29: qty 50

## 2013-06-29 MED ORDER — PROPOFOL 10 MG/ML IV BOLUS
INTRAVENOUS | Status: DC | PRN
  Administered 2013-06-29: 200 mg via INTRAVENOUS

## 2013-06-29 MED ORDER — BUPIVACAINE-EPINEPHRINE (PF) 0.5% -1:200000 IJ SOLN
INTRAMUSCULAR | Status: AC
Start: 2013-06-29 — End: 2013-06-29
  Filled 2013-06-29: qty 30

## 2013-06-29 MED ORDER — MEPERIDINE HCL 50 MG/ML IJ SOLN: 6.2500 mg | INTRAMUSCULAR | Status: DC | PRN

## 2013-06-29 MED ORDER — PROPOFOL 10 MG/ML IV BOLUS
INTRAVENOUS | Status: AC
  Filled 2013-06-29: qty 20

## 2013-06-29 SURGICAL SUPPLY — 25 items
BANDAGE ELASTIC 6 VELCRO ST LF (GAUZE/BANDAGES/DRESSINGS) ×2 IMPLANT
BLADE 4.2CUDA (BLADE) IMPLANT
BLADE CUDA SHAVER 3.5 (BLADE) ×2 IMPLANT
BNDG COHESIVE 6X5 TAN STRL LF (GAUZE/BANDAGES/DRESSINGS) IMPLANT
CLOTH 2% CHLOROHEXIDINE 3PK (PERSONAL CARE ITEMS) IMPLANT
DRSG EMULSION OIL 3X3 NADH (GAUZE/BANDAGES/DRESSINGS) ×2 IMPLANT
DRSG PAD ABDOMINAL 8X10 ST (GAUZE/BANDAGES/DRESSINGS) ×2 IMPLANT
DURAPREP 26ML APPLICATOR (WOUND CARE) ×2 IMPLANT
GLOVE BIO SURGEON STRL SZ8 (GLOVE) ×2 IMPLANT
GLOVE BIOGEL PI IND STRL 8 (GLOVE) ×1 IMPLANT
GLOVE BIOGEL PI IND STRL 8.5 (GLOVE) ×1 IMPLANT
GLOVE BIOGEL PI INDICATOR 8 (GLOVE) ×1
GLOVE BIOGEL PI INDICATOR 8.5 (GLOVE) ×1
GLOVE SURG SS PI 8.0 STRL IVOR (GLOVE) ×4 IMPLANT
GOWN STRL REUS W/TWL XL LVL3 (GOWN DISPOSABLE) ×4 IMPLANT
MANIFOLD NEPTUNE II (INSTRUMENTS) ×2 IMPLANT
PACK ARTHROSCOPY WL (CUSTOM PROCEDURE TRAY) ×2 IMPLANT
PADDING CAST COTTON 6X4 STRL (CAST SUPPLIES) ×2 IMPLANT
SET ARTHROSCOPY TUBING (MISCELLANEOUS) ×1
SET ARTHROSCOPY TUBING LN (MISCELLANEOUS) ×1 IMPLANT
SPONGE GAUZE 4X4 12PLY (GAUZE/BANDAGES/DRESSINGS) ×2 IMPLANT
SUT ETHILON 4 0 PS 2 18 (SUTURE) ×2 IMPLANT
WAND 90 DEG TURBOVAC W/CORD (SURGICAL WAND) IMPLANT
WAND HAND CNTRL MULTIVAC 50 (MISCELLANEOUS) ×2 IMPLANT
WRAP KNEE MAXI GEL POST OP (GAUZE/BANDAGES/DRESSINGS) ×2 IMPLANT

## 2013-06-29 NOTE — Discharge Instructions (Signed)

## 2013-06-29 NOTE — H&P (View-Only) (Signed)
[name] is an 68 y.o. male.   Chief Complaint: left knee pain HPI: The patient is a 68 year old male who presents today for follow up of their knee. The patient is being followed for their left knee pain. They are now 3 1/2 months out from when symptoms began and 7 weeks out from a cortisone injection. Symptoms reported today include: pain (left knee and calf). and report their pain level to be moderate to severe. Current treatment includes: NSAIDs (Aleve) and icing. The patient presents today following MRI. The patient has reported symptom improvement with: Cortisone injections (short term relief) while they have not gotten any relief of their symptoms with: bracing.  [name] follows up. He has pain, swelling, locking and giving way of the knee. When he gets up, he has stiffness. He does not have severe pain when he's walking.  Past Medical History  Diagnosis Date  . Gout   . HTN (hypertension)   . GI bleed 2005    after colonoscopy  . Hyperlipidemia   . GERD (gastroesophageal reflux disease)   . Personal history of rectal adenoma with high-grade dysplasia and colonic adenomas   . Benign fundic gland polyps of stomach     Past Surgical History  Procedure Laterality Date  . Tumor removal  1984    from Jaw  . Knee arthroscopy  2011    Dr. Tonita Cong  . Total knee arthroplasty  6/11    Dr. Tonita Cong  . Colonoscopy    . Upper gastrointestinal endoscopy      Family History  Problem Relation Age of Onset  . Stroke Mother   . Diabetes Mother   . Colon cancer Neg Hx    Social History:  reports that he has never smoked. He has never used smokeless tobacco. He reports that he drinks about 5 ounces of alcohol per week. He reports that he does not use illicit drugs.  Allergies:  Allergies  Allergen Reactions  . Ace Inhibitors   . Atorvastatin     REACTION: breast tenderness  . Doxycycline     REACTION: stomach cramping. flatus     (Not in a hospital admission)  No  results found for this or any previous visit (from the past 48 hour(s)). No results found.  Review of Systems  Constitutional: Negative.   HENT: Negative.   Eyes: Negative.   Respiratory: Negative.   Cardiovascular: Negative.   Gastrointestinal: Negative.   Genitourinary: Negative.   Musculoskeletal: Positive for joint pain.  Skin: Negative.   Neurological: Negative.   Psychiatric/Behavioral: Negative.     There were no vitals taken for this visit. Physical Exam  Constitutional: He is oriented to person, place, and time. He appears well-developed and well-nourished.  HENT:  Head: Normocephalic and atraumatic.  Eyes: Conjunctivae and EOM are normal. Pupils are equal, round, and reactive to light.  Neck: Normal range of motion. Neck supple.  Cardiovascular: Normal rate and regular rhythm.   Respiratory: Effort normal and breath sounds normal.  GI: Soft. Bowel sounds are normal.  Musculoskeletal:  On exam, he has a mild to moderate effusion. He is tender posteromedially. Equivocal [place]. He has a mild discomfort and patellofemoral pain with compression. His range is -5 to 120 without instability. No DVT. Ipsilateral hip and ankle exam is unremarkable. His MRI demonstrates a complex tear of the posterior horn of the medial meniscus with a displaced flap fragment, severe patellofemoral arthrosis, popliteal cyst, prepatellar bursitis.  Neurological: He is alert  and oriented to person, place, and time. He has normal reflexes.  Skin: Skin is warm and dry.  Psychiatric: He has a normal mood and affect.    MRI with MMT, DJD, large popliteal cyst  Assessment/Plan L knee DJD, meniscus tear Refractory mechanical symptoms, left knee, multifactorial, degenerative arthrosis, medial compartment patellofemoral joint, complex tear of the medial meniscus, refractory to rest, activity modification, corticosteroid injection, home exercises.  We discussed options, living with symptoms,  arthroscopic partial meniscectomy and debridement vs. total knee. He does not have much in terms of bone marrow edema or severe weight bearing type pain, therefore I do feel that a total knee can be delayed. He may benefit by arthroscopic debridement and partial meniscectomy and temporizing measures followed by corticosteroid injection or Visco supplementation.  I had a long discussion with the patient concerning the risks and benefits of knee arthroscopy including help from the arthroscopic procedure as well as no help from the arthroscopic procedure or worsening of symptoms. Also discussed infection, DVT, PE, anesthetic complications, etc. Also discussed the possibility of repeat arthroscopic surgery required in the future or total knee replacement. I provided the patient with an illustrated handout and discussed it in detail as well as discussed the postoperative and perioperative courses and return to functional activities including work. Need for postoperative DVT prophylaxis was discussed as well.  They would like to think about that for awhile. In the interim, he has some upcoming challenges. We will proceed with a corticosteroid injection.  After discussing the risks and benefits of an injection, I sterilely prepped the knee with Betadine and alcohol, and under strict, sterile conditions injected the patient with 1 cc. of Aristospan and 8 cc. of 1% Lidocaine solution with reduction of pain. Post-injection instructions were given.  We spent a considerable amount of time discussing the limitations as well as the benefits of arthroscopic debridement vs. total knee. Given his other side, he most likely will require it in the future. At this time he is not demonstrating severe pain that I would say would be a definitive pre-requisite for that. He will contemplate that option and call back if he wants to proceed.  Plan L knee arthroscopy, partial meniscectomy, debridement  Conley Rolls. [place] for  Dr. Tonita Cong 06/14/2013, 11:00 AM

## 2013-06-29 NOTE — Brief Op Note (Signed)
06/29/2013  9:31 AM  PATIENT:  [name]  68 y.o. male  PRE-OPERATIVE DIAGNOSIS:  menical tear left knee  POST-OPERATIVE DIAGNOSIS:  menical tear left knee  PROCEDURE:  Procedure(s): LEFT KNEE ARTHROSCOPY WITH DEBRIDEMENT AND A PARTIAL MEDIAL MENISECTOMY (Left)  SURGEON:  Surgeon(s) and Role:    * Johnn Hai, MD - Primary  PHYSICIAN ASSISTANT:   ASSISTANTS: none   ANESTHESIA:   general  EBL:     BLOOD ADMINISTERED:none  DRAINS: none   LOCAL MEDICATIONS USED:  MARCAINE     SPECIMEN:  No Specimen  DISPOSITION OF SPECIMEN:  N/A  COUNTS:  YES  TOURNIQUET:  * No tourniquets in log *  DICTATION: .Other Dictation: Dictation Number [id number]  PLAN OF CARE: Discharge to home after PACU  PATIENT DISPOSITION:  PACU - hemodynamically stable.   Delay start of Pharmacological VTE agent (>24hrs) due to surgical blood loss or risk of bleeding: no

## 2013-06-29 NOTE — Transfer of Care (Signed)
Immediate Anesthesia Transfer of Care Note  Patient: [name]  Procedure(s) Performed: Procedure(s): LEFT KNEE ARTHROSCOPY WITH DEBRIDEMENT AND A PARTIAL MEDIAL MENISECTOMY (Left)  Patient Location: PACU  Anesthesia Type:General  Level of Consciousness: Patient easily awoken, sedated, comfortable, cooperative, following commands, responds to stimulation.   Airway & Oxygen Therapy: Patient spontaneously breathing, ventilating well, oxygen via simple oxygen mask.  Post-op Assessment: Report given to PACU RN, vital signs reviewed and stable, moving all extremities.   Post vital signs: Reviewed and stable.  Complications: No apparent anesthesia complications

## 2013-06-29 NOTE — Interval H&P Note (Signed)
History and Physical Interval Note:  06/29/2013 7:29 AM  [name]  has presented today for surgery, with the diagnosis of menical tear left knee  The various methods of treatment have been discussed with the patient and family. After consideration of risks, benefits and other options for treatment, the patient has consented to  Procedure(s): LEFT KNEE ARTHROSCOPY WITH DEBRIDEMENT AND A PARTIAL MEDIAL MENISECTOMY (Left) as a surgical intervention .  The patient's history has been reviewed, patient examined, no change in status, stable for surgery.  I have reviewed the patient's chart and labs.  Questions were answered to the patient's satisfaction.     [name]

## 2013-06-29 NOTE — Anesthesia Preprocedure Evaluation (Addendum)
Anesthesia Evaluation  Patient identified by MRN, date of birth, ID band Patient awake    Reviewed: Allergy & Precautions, H&P , NPO status , Patient's Chart, lab work & pertinent test results  Airway Mallampati: II TM Distance: >3 FB Neck ROM: Full    Dental no notable dental hx.    Pulmonary neg pulmonary ROS,  breath sounds clear to auscultation  Pulmonary exam normal       Cardiovascular hypertension, Pt. on medications Rhythm:Regular Rate:Normal     Neuro/Psych negative neurological ROS  negative psych ROS   GI/Hepatic Neg liver ROS, GERD-  Medicated and Controlled,  Endo/Other  negative endocrine ROS  Renal/GU negative Renal ROS  negative genitourinary   Musculoskeletal negative musculoskeletal ROS (+)   Abdominal   Peds negative pediatric ROS (+)  Hematology negative hematology ROS (+)   Anesthesia Other Findings Multiple lower teeth capped  Reproductive/Obstetrics negative OB ROS                        Anesthesia Physical Anesthesia Plan  ASA: II  Anesthesia Plan: General   Post-op Pain Management:    Induction: Intravenous  Airway Management Planned: LMA  Additional Equipment:   Intra-op Plan:   Post-operative Plan: Extubation in OR  Informed Consent: I have reviewed the patients History and Physical, chart, labs and discussed the procedure including the risks, benefits and alternatives for the proposed anesthesia with the patient or authorized representative who has indicated his/her understanding and acceptance.   Dental advisory given  Plan Discussed with: CRNA  Anesthesia Plan Comments:        Anesthesia Quick Evaluation

## 2013-06-30 NOTE — Op Note (Signed)
Dwayne, [name]              ACCOUNT NO.:  [id number]  MEDICAL RECORD NO.:  [id number]  LOCATION:  [facility]                         FACILITY:  [facility]  PHYSICIAN:  Susa Day, M.D.    DATE OF BIRTH:  20-Jan-1946  DATE OF PROCEDURE:  06/29/2013 DATE OF DISCHARGE:  06/29/2013                              OPERATIVE REPORT   PREOPERATIVE DIAGNOSIS:  Degenerative joint disease medial meniscus tear, left knee.  POSTOPERATIVE DIAGNOSIS: 1. Degenerative joint disease medial meniscus tear, left knee with     grade extensive grade 3 changes and minor grade 4 changes of the     medial compartment. 2. Medial meniscus tear. 3. Grade 3 changes of the patella and femoral sulcus.  PROCEDURE PERFORMED: 1. Left knee arthroscopy. 2. Partial medial meniscectomy. 3. Chondroplasty patellofemoral joint, medial femoral condyle, and     medial tibial plateau.  ANESTHESIA:  General.  ASSISTANT:  None.  HISTORY:  This is a 68 year old with locking, popping, giving way, fusion, degenerative changes on x-ray indicated for arthroscopic debridement and evaluation of the medial meniscus tear.  Risk and benefits discussed including bleeding, infection, damage to neurovascular structures, no change in symptoms, worsening symptoms, DVT, PE, anesthetic complications et Ronney Asters.  TECHNIQUE:  With the patient in supine position, after induction of adequate general anesthesia, 2 g Kefzol, left lower extremity was prepped draped in usual sterile fashion.  A lateral parapatellar portal was fashioned with a #11 blade.  Ingress cannula atraumatically placed. Synovial fluid with cartilaginous debride was evacuated.  Irrigant was utilized to insufflate the joint at 65 mmHg.  Under direct visualization, the medial parapatellar portal was fashioned with a #11 blade after localization with an 18-gauge needle sparing the medial meniscus.  Noted was extensive grade 3 changes in medial compartment, loose cartilaginous  debris, complex tearing of the midportion of the medial meniscus, and some small grade 4 changes over the femoral condyle and tibial plateau midportion medially.  Performed a light chondroplasty, femoral condyle, and tibial plateau with 3.5 Cuda shaver. We used a combination of the basket we performed a partial medial meniscectomy to a stable base 3.5 Cuda shaver and an ArthroWand. Approximately 30% of the mid and posterior third of the meniscus was resected.  The remnant was stable to probe palpation.  ACL was unremarkable.  Lateral compartment revealed essentially normal femoral condyle, tibial plateau, radial fraying of the meniscus without significance.  Stable to probe palpation.  Suprapatellar pouch grade 3 changes of the patellofemoral joint, light chondroplasty performed in the patella sulcus.  The gutters unremarkable.  I revisited all compartments, no further pathology amenable to arthroscopic intervention.  I, therefore, removed all instrumentation.  Portals were closed with 4-0 nylon simple sutures.  A 0.25% Marcaine with epinephrine was infiltrated in the joint.  Wound was dressed sterilely.  Awoken without difficulty and transported to the recovery room in satisfactory condition.  Minimal blood loss.  The patient tolerated the procedure well with no complications.     Susa Day, M.D.     [name]  D:  06/29/2013  T:  06/30/2013  Job:  [id number]

## 2013-07-02 ENCOUNTER — Encounter (HOSPITAL_COMMUNITY): Payer: Self-pay | Admitting: Specialist

## 2013-07-02 NOTE — Anesthesia Postprocedure Evaluation (Signed)
  Anesthesia Post-op Note  Patient: [name]  Procedure(s) Performed: Procedure(s) (LRB): LEFT KNEE ARTHROSCOPY WITH DEBRIDEMENT AND A PARTIAL MEDIAL MENISECTOMY (Left)  Patient Location: PACU  Anesthesia Type: General  Level of Consciousness: awake and alert   Airway and Oxygen Therapy: Patient Spontanous Breathing  Post-op Pain: mild  Post-op Assessment: Post-op Vital signs reviewed, Patient's Cardiovascular Status Stable, Respiratory Function Stable, Patent Airway and No signs of Nausea or vomiting  Last Vitals:  Filed Vitals:   06/29/13 1228  BP: 159/79  Pulse: 61  Temp:   Resp:     Post-op Vital Signs: stable   Complications: No apparent anesthesia complications

## 2013-08-14 ENCOUNTER — Other Ambulatory Visit: Payer: Self-pay | Admitting: Internal Medicine

## 2013-09-03 ENCOUNTER — Telehealth: Payer: Self-pay | Admitting: Internal Medicine

## 2013-09-03 MED ORDER — METOPROLOL SUCCINATE ER 50 MG PO TB24: 50.0000 mg | ORAL_TABLET | Freq: Every evening | ORAL | Status: DC

## 2013-09-03 MED ORDER — LEVOTHYROXINE SODIUM 50 MCG PO TABS: 50.0000 ug | ORAL_TABLET | Freq: Every day | ORAL | Status: DC

## 2013-09-03 NOTE — Telephone Encounter (Signed)
Pt states [employer] scripts for his 2 medications needs to be faxed.  Levothyroxine (SYNTHROID, LEVOTHROID) 50 MCG tablet [[id number]],EVOJJKKXFG succinate (TOPROL-XL) 50 MG 24 hr tablet [[id number]].  Best # to call pt [phone].

## 2013-09-03 NOTE — Telephone Encounter (Signed)
rx sent in electronically 

## 2013-09-10 ENCOUNTER — Telehealth: Payer: Self-pay | Admitting: Internal Medicine

## 2013-09-10 NOTE — Telephone Encounter (Signed)
Dr Bernadette Hoit office has sent prior approval for pt's surgery to dr swords last week. Pt needs that to be scheduled for knee surgery. pls advise when paperwork will be sent back to dr bean

## 2013-09-10 NOTE — Telephone Encounter (Signed)
Dr Leanne Chang cleared for surgery, order was faxed

## 2013-09-11 ENCOUNTER — Other Ambulatory Visit: Payer: Self-pay | Admitting: Orthopedic Surgery

## 2013-09-15 HISTORY — PX: JOINT REPLACEMENT: SHX530

## 2013-09-19 ENCOUNTER — Encounter (HOSPITAL_COMMUNITY): Payer: Self-pay

## 2013-09-19 ENCOUNTER — Encounter (HOSPITAL_COMMUNITY): Payer: Self-pay | Admitting: Pharmacy Technician

## 2013-09-20 ENCOUNTER — Other Ambulatory Visit (HOSPITAL_COMMUNITY): Payer: Self-pay | Admitting: Specialist

## 2013-09-20 ENCOUNTER — Encounter (HOSPITAL_COMMUNITY): Payer: Self-pay | Admitting: Pharmacy Technician

## 2013-09-20 NOTE — Progress Notes (Signed)
ekg 4/15, chest 5/15 EPIC , clearance dr swords chart

## 2013-09-20 NOTE — Patient Instructions (Addendum)
Your procedure is scheduled on:  09/28/13  FRIDAY  Report to [facility] at    1115   AM.   Call this number if you have problems the morning of surgery: [phone]        Do not eat food After Midnight. Thursday NIGHT--- MAY HAVE CLEAR LIQUIDS Friday MORNING UNTIL 0715 AM-- THEN NOTHING BY MOUTH   Take these medicines the morning of surgery with A SIP OF WATER: NONE   .  Contacts, dentures or partial plates, or metal hairpins  can not be worn to surgery. Your family will be responsible for glasses, dentures, hearing aides while you are in surgery  Leave suitcase in the car. After surgery it may be brought to your room.  For patients admitted to the hospital, checkout time is 11:00 AM day of  discharge.         [name] IS NOT RESPONSIBLE FOR ANY VALUABLES                                                                                                                                [name] - Preparing for Surgery Before surgery, you can play an important role.  Because skin is not sterile, your skin needs to be as free of germs as possible.  You can reduce the number of germs on your skin by washing with CHG (chlorahexidine gluconate) soap before surgery.  CHG is an antiseptic cleaner which kills germs and bonds with the skin to continue killing germs even after washing. Please DO NOT use if you have an allergy to CHG or antibacterial soaps.  If your skin becomes reddened/irritated stop using the CHG and inform your nurse when you arrive at Short Stay. Do not shave (including legs and underarms) for at least 48 hours prior to the first CHG shower.  You may shave your face/neck. Please follow these instructions carefully:  1.  Shower with CHG Soap the night before surgery and the  morning of Surgery.  2.  If you choose to wash your hair, wash your hair first as usual with your  normal  shampoo.  3.   After you shampoo, rinse your hair and body thoroughly to remove the  shampoo.                           4.  Use CHG as you would any other liquid soap.  You can apply chg directly  to the skin and wash                       Gently with a scrungie or clean washcloth.  5.  Apply the CHG Soap to your body ONLY FROM THE NECK DOWN.   Do not use on face/ open  Wound or open sores. Avoid contact with eyes, ears mouth and genitals (private parts).                       Wash face,  Genitals (private parts) with your normal soap.             6.  Wash thoroughly, paying special attention to the area where your surgery  will be performed.  7.  Thoroughly rinse your body with warm water from the neck down.  8.  DO NOT shower/wash with your normal soap after using and rinsing off  the CHG Soap.                9.  Pat yourself dry with a clean towel.            10.  Wear clean pajamas.            11.  Place clean sheets on your bed the night of your first shower and do not  sleep with pets. Day of Surgery : Do not apply any lotions/deodorants the morning of surgery.  Please wear clean clothes to the hospital/surgery center.  FAILURE TO FOLLOW THESE INSTRUCTIONS MAY RESULT IN THE CANCELLATION OF YOUR SURGERY PATIENT SIGNATURE_________________________________  NURSE SIGNATURE__________________________________  ________________________________________________________________________  WHAT IS A BLOOD TRANSFUSION? Blood Transfusion Information  A transfusion is the replacement of blood or some of its parts. Blood is made up of multiple cells which provide different functions.  Red blood cells carry oxygen and are used for blood loss replacement.  White blood cells fight against infection.  Platelets control bleeding.  Plasma helps clot blood.  Other blood products are available for specialized needs, such as hemophilia or other clotting disorders. BEFORE THE TRANSFUSION  Who  gives blood for transfusions?   Healthy volunteers who are fully evaluated to make sure their blood is safe. This is blood bank blood. Transfusion therapy is the safest it has ever been in the practice of medicine. Before blood is taken from a donor, a complete history is taken to make sure that person has no history of diseases nor engages in risky social behavior (examples are intravenous drug use or sexual activity with multiple partners). The donor's travel history is screened to minimize risk of transmitting infections, such as malaria. The donated blood is tested for signs of infectious diseases, such as HIV and hepatitis. The blood is then tested to be sure it is compatible with you in order to minimize the chance of a transfusion reaction. If you or a relative donates blood, this is often done in anticipation of surgery and is not appropriate for emergency situations. It takes many days to process the donated blood. RISKS AND COMPLICATIONS Although transfusion therapy is very safe and saves many lives, the main dangers of transfusion include:   Getting an infectious disease.  Developing a transfusion reaction. This is an allergic reaction to something in the blood you were given. Every precaution is taken to prevent this. The decision to have a blood transfusion has been considered carefully by your caregiver before blood is given. Blood is not given unless the benefits outweigh the risks. AFTER THE TRANSFUSION  Right after receiving a blood transfusion, you will usually feel much better and more energetic. This is especially true if your red blood cells have gotten low (anemic). The transfusion raises the level of the red blood cells which carry oxygen, and this usually causes an energy increase.  The  nurse administering the transfusion will monitor you carefully for complications. HOME CARE INSTRUCTIONS  No special instructions are needed after a transfusion. You may find your energy is  better. Speak with your caregiver about any limitations on activity for underlying diseases you may have. SEEK MEDICAL CARE IF:   Your condition is not improving after your transfusion.  You develop redness or irritation at the intravenous (IV) site. SEEK IMMEDIATE MEDICAL CARE IF:  Any of the following symptoms occur over the next 12 hours:  Shaking chills.  You have a temperature by mouth above 102 F (38.9 C), not controlled by medicine.  Chest, back, or muscle pain.  People around you feel you are not acting correctly or are confused.  Shortness of breath or difficulty breathing.  Dizziness and fainting.  You get a rash or develop hives.  You have a decrease in urine output.  Your urine turns a dark color or changes to pink, red, or brown. Any of the following symptoms occur over the next 10 days:  You have a temperature by mouth above 102 F (38.9 C), not controlled by medicine.  Shortness of breath.  Weakness after normal activity.  The white part of the eye turns yellow (jaundice).  You have a decrease in the amount of urine or are urinating less often.  Your urine turns a dark color or changes to pink, red, or brown. Document Released: 01/30/2000 Document Revised: 04/26/2011 Document Reviewed: 09/18/2007 ExitCare Patient Information 2014 [place].  _______________________________________________________________________  Incentive Spirometer  An incentive spirometer is a tool that can help keep your lungs clear and active. This tool measures how well you are filling your lungs with each breath. Taking long deep breaths may help reverse or decrease the chance of developing breathing (pulmonary) problems (especially infection) following:  A long period of time when you are unable to move or be active. BEFORE THE PROCEDURE   If the spirometer includes an indicator to show your best effort, your nurse or respiratory therapist will set it to a desired  goal.  If possible, sit up straight or lean slightly forward. Try not to slouch.  Hold the incentive spirometer in an upright position. INSTRUCTIONS FOR USE  1. Sit on the edge of your bed if possible, or sit up as far as you can in bed or on a chair. 2. Hold the incentive spirometer in an upright position. 3. Breathe out normally. 4. Place the mouthpiece in your mouth and seal your lips tightly around it. 5. Breathe in slowly and as deeply as possible, raising the piston or the ball toward the top of the column. 6. Hold your breath for 3-5 seconds or for as long as possible. Allow the piston or ball to fall to the bottom of the column. 7. Remove the mouthpiece from your mouth and breathe out normally. 8. Rest for a few seconds and repeat Steps 1 through 7 at least 10 times every 1-2 hours when you are awake. Take your time and take a few normal breaths between deep breaths. 9. The spirometer may include an indicator to show your best effort. Use the indicator as a goal to work toward during each repetition. 10. After each set of 10 deep breaths, practice coughing to be sure your lungs are clear. If you have an incision (the cut made at the time of surgery), support your incision when coughing by placing a pillow or rolled up towels firmly against it. Once you are able to get  out of bed, walk around indoors and cough well. You may stop using the incentive spirometer when instructed by your caregiver.  RISKS AND COMPLICATIONS  Take your time so you do not get dizzy or light-headed.  If you are in pain, you may need to take or ask for pain medication before doing incentive spirometry. It is harder to take a deep breath if you are having pain. AFTER USE  Rest and breathe slowly and easily.  It can be helpful to keep track of a log of your progress. Your caregiver can provide you with a simple table to help with this. If you are using the spirometer at home, follow these instructions: [place] IF:   You are having difficultly using the spirometer.  You have trouble using the spirometer as often as instructed.  Your pain medication is not giving enough relief while using the spirometer.  You develop fever of 100.5 F (38.1 C) or higher. SEEK IMMEDIATE MEDICAL CARE IF:   You cough up bloody sputum that had not been present before.  You develop fever of 102 F (38.9 C) or greater.  You develop worsening pain at or near the incision site. MAKE SURE YOU:   Understand these instructions.  Will watch your condition.  Will get help right away if you are not doing well or get worse. Document Released: 06/14/2006 Document Revised: 04/26/2011 Document Reviewed: 08/15/2006 ExitCare Patient Information 2014 [place].   ________________________________________________________________________    CLEAR LIQUID DIET   Foods Allowed                                                                     Foods Excluded  Coffee and tea, regular and decaf                             liquids that you cannot  Plain Jell-O in any flavor                                             see through such as: Fruit ices (not with fruit pulp)                                     milk, soups, orange juice  Iced Popsicles                                    All solid food Carbonated beverages, regular and diet                                    Cranberry, grape and apple juices Sports drinks like Gatorade Lightly seasoned clear broth or consume(fat free) Sugar, honey syrup  Sample Menu Breakfast  Lunch                                     Supper Cranberry juice                    Beef broth                            Chicken broth Jell-O                                     Grape juice                           Apple juice Coffee or tea                        Jell-O                                      Popsicle                                                 Coffee or tea                        Coffee or tea  _____________________________________________________________________

## 2013-09-21 ENCOUNTER — Ambulatory Visit (HOSPITAL_COMMUNITY)
Admission: RE | Admit: 2013-09-21 | Discharge: 2013-09-21 | Disposition: A | Discharge status: Home / Self Care | Payer: BC Managed Care – PPO | Source: Ambulatory Visit | Attending: Orthopedic Surgery | Admitting: Orthopedic Surgery

## 2013-09-21 ENCOUNTER — Encounter (HOSPITAL_COMMUNITY)
Admission: RE | Admit: 2013-09-21 | Discharge: 2013-09-21 | Disposition: A | Discharge status: Home / Self Care | Payer: BC Managed Care – PPO | Source: Ambulatory Visit | Attending: Specialist | Admitting: Specialist

## 2013-09-21 ENCOUNTER — Encounter (INDEPENDENT_AMBULATORY_CARE_PROVIDER_SITE_OTHER): Payer: Self-pay

## 2013-09-21 ENCOUNTER — Encounter (HOSPITAL_COMMUNITY): Payer: Self-pay

## 2013-09-21 DIAGNOSIS — Z01818 Encounter for other preprocedural examination: Secondary | ICD-10-CM | POA: Insufficient documentation

## 2013-09-21 DIAGNOSIS — M25569 Pain in unspecified knee: Secondary | ICD-10-CM | POA: Insufficient documentation

## 2013-09-21 DIAGNOSIS — Z01812 Encounter for preprocedural laboratory examination: Secondary | ICD-10-CM | POA: Insufficient documentation

## 2013-09-21 HISTORY — DX: Hypothyroidism, unspecified: E03.9

## 2013-09-21 HISTORY — DX: Unspecified osteoarthritis, unspecified site: M19.90

## 2013-09-21 LAB — SURGICAL PCR SCREEN
MRSA, PCR: NEGATIVE
STAPHYLOCOCCUS AUREUS: NEGATIVE

## 2013-09-21 LAB — URINALYSIS, ROUTINE W REFLEX MICROSCOPIC
Bilirubin Urine: NEGATIVE
Glucose, UA: NEGATIVE mg/dL
Hgb urine dipstick: NEGATIVE
Ketones, ur: NEGATIVE mg/dL
LEUKOCYTES UA: NEGATIVE
Nitrite: NEGATIVE
PH: 6.5 (ref 5.0–8.0)
PROTEIN: NEGATIVE mg/dL
SPECIFIC GRAVITY, URINE: 1.014 (ref 1.005–1.030)
Urobilinogen, UA: 0.2 mg/dL (ref 0.0–1.0)

## 2013-09-21 LAB — CBC
HEMATOCRIT: 46.8 % (ref 39.0–52.0)
Hemoglobin: 16.8 g/dL (ref 13.0–17.0)
MCH: 32.1 pg (ref 26.0–34.0)
MCHC: 35.9 g/dL (ref 30.0–36.0)
MCV: 89.5 fL (ref 78.0–100.0)
PLATELETS: 202 10*3/uL (ref 150–400)
RBC: 5.23 MIL/uL (ref 4.22–5.81)
RDW: 13.1 % (ref 11.5–15.5)
WBC: 5.3 10*3/uL (ref 4.0–10.5)

## 2013-09-21 LAB — BASIC METABOLIC PANEL
Anion gap: 11 (ref 5–15)
BUN: 17 mg/dL (ref 6–23)
CALCIUM: 9.3 mg/dL (ref 8.4–10.5)
CO2: 27 mEq/L (ref 19–32)
CREATININE: 0.9 mg/dL (ref 0.50–1.35)
Chloride: 100 mEq/L (ref 96–112)
GFR calc Af Amer: >90 mL/min (ref >90)
GFR calc non Af Amer: 85 mL/min — ABNORMAL LOW (ref >90)
GLUCOSE: 108 mg/dL — AB (ref 70–99)
Potassium: 3.7 mEq/L (ref 3.7–5.3)
Sodium: 138 mEq/L (ref 137–147)

## 2013-09-21 LAB — PROTIME-INR
INR: 0.97 (ref 0.00–1.49)
Prothrombin Time: 12.9 seconds (ref 11.6–15.2)

## 2013-09-21 NOTE — Progress Notes (Signed)
09/21/13 0819  OBSTRUCTIVE SLEEP APNEA  Have you ever been diagnosed with sleep apnea through a sleep study? No  Do you snore loudly (loud enough to be heard through closed doors)?  0  Do you often feel tired, fatigued, or sleepy during the daytime? 0  Has anyone observed you stop breathing during your sleep? 0  Do you have, or are you being treated for high blood pressure? 1  BMI more than 35 kg/m2? 0  Age over 68 years old? 1  Neck circumference greater than 40 cm/16 inches? 1  Gender: 1  Obstructive Sleep Apnea Score 4  Score 4 or greater  Results sent to PCP

## 2013-09-27 ENCOUNTER — Ambulatory Visit: Payer: Self-pay | Admitting: Orthopedic Surgery

## 2013-09-27 NOTE — H&P (Signed)
[name] DOB: February 05, 1946 Married / Language: English / Race: White Male  H&P date: 09/25/13  Chief complaint: Left knee pain  History of Present Illness The patient is a 68 year old male who comes in today for a preoperative history and physical. The patient is scheduled for a left total knee arthroplasty to be performed by Dr. Johnn Hai, MD at [facility] on 09/28/2013. [name] has had worsening L knee pain x approx 8 months, refractory to steroid injections, viscosupplementation, quad strengthening, activity modifications, relative rest, bracing, arch supports, ice and elevation, NSAIDs, arthroscopy (5/15, grade 3 and 4 changes).  Dr. Tonita Cong and the patient have mutually agreed to with total knee replacement. Discussed the procedure itself as well as risks, complications and alternatives, including but not limited to DVT, PE, infx, bleeding, failure of procedure, need for secondary procedure including manipulation, nerve injury, ongoing pain/symptoms, anesthesia risk, even stroke or death. Also discussed typical post-op protocols, activity restrictions, need for PT, flexion/extension exercises, time out of work. Discussed need for DVT ppx post-op with Xarelto then ASA per protocol. Discussed dental ppx. Also discussed limitations post-operatively such as kneeling and squatting. All questions were answered. Patient desires to proceed with surgery.  He has been cleared by his PCP. He is holding supplements, vitamins, NSAIDs, ASA accordingly.  Problem List/Past Medical Hx Hypercholesterolemia High blood pressure  Allergies No Known Drug Allergies08/23/2012  Family History  Hypertension First Degree Relatives. mother First Degree Relatives  Social History Tobacco use Never smoker. never smoker Living situation live with spouse- 2 level house with option to stay on 1st level. 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 Glucosamine Complex (Oral) Active. Acyclovir (Oral) Specific dose unknown - Active. Azor (Oral) Specific dose unknown - Active. Metoprolol Succinate (Oral) Specific dose unknown - Active. Nitrostat (Sublingual) Specific dose unknown - Active. Omeprazole (Oral) Specific dose unknown - Active. ASA Buff (Mag Carb-Al Glyc) (Oral) Specific dose unknown - Active. Vitamins & Minerals (Oral) Specific dose unknown - Active. Aleve (220MG  Capsule, Oral) Active. Norco (5-325MG  Tablet, Oral) Active. (rarely) Medications Reconciled  Past Surgical History Total Knee Replacement right Arthroscopic Knee Surgery - Left Other Surgery jaw reconstruction 1980  Review of Systems General Not Present- Chills, Fatigue, Fever, Memory Loss, Night Sweats, Weight Gain and Weight Loss. Skin Not Present- Eczema, Hives, Itching, Lesions and Rash. HEENT Not Present- Dentures, Double Vision, Headache, Hearing Loss, Tinnitus and Visual Loss. Respiratory Not Present- Allergies, Chronic Cough, Coughing up blood, Shortness of breath at rest and Shortness of breath with exertion. Cardiovascular Not Present- Chest Pain, Difficulty Breathing Lying Down, Murmur, Palpitations, Racing/skipping heartbeats and Swelling. Gastrointestinal Not Present- Abdominal Pain, Bloody Stool, Constipation, Diarrhea, Difficulty Swallowing, Heartburn, Jaundice, Loss of appetitie, Nausea and Vomiting. Male Genitourinary Not Present- Blood in Urine, Discharge, Flank Pain, Incontinence, Painful Urination, Urgency, Urinary  frequency, Urinary Retention, Urinating at Night and Weak urinary stream. Musculoskeletal Present- Morning Stiffness and Muscle Pain. Not Present- Back Pain, Joint Pain, Joint Swelling, Muscle Weakness and Spasms. Neurological Not Present- Blackout spells, Difficulty with balance, Dizziness, Paralysis, Tremor and Weakness. Psychiatric Not Present- Insomnia.  Physical Exam General Mental Status -Alert, cooperative and good historian. General Appearance-pleasant, Not in acute distress. Orientation-Oriented X3. Build & Nutrition-Well nourished and Well developed.  Head and Neck Head-normocephalic, atraumatic . Neck Global Assessment - supple, no bruit auscultated on the right, no bruit auscultated on the left.  Eye Pupil - Bilateral-Regular and Round. Motion - Bilateral-EOMI.  Chest and Lung Exam Auscultation Breath sounds - clear at anterior chest wall and clear at posterior chest wall. Adventitious sounds - No Adventitious sounds.  Cardiovascular Auscultation Rhythm - Regular rate and rhythm. Heart Sounds - S1 WNL and S2 WNL. Murmurs & Other Heart Sounds - Auscultation of the heart reveals - No Murmurs.  Abdomen Palpation/Percussion Tenderness - Abdomen is non-tender to palpation. Rigidity (guarding) - Abdomen is soft. Auscultation Auscultation of the abdomen reveals - Bowel sounds normal.  Male Genitourinary Note: Not done, not pertinent to present illness  Musculoskeletal Note: On exam he is tender along the medial joint line. Patellofemoral pain with compression. Moderate effusion, 0 to 120. No instability. Ipsilateral hip and ankle exams are unremarkable.  Imaging AP standing and lateral demonstrates bone on bone arthrosis medial compartment.  Assessment & Plan  DJD left knee  Pt with L knee DJD, end-stage, bone-on-bone, refractory to conservative tx including steroid injections, viscosupplementation, arthroscopic debridement, quad strengthening,  activity modifications, bracing, ice and elevation, NSAIDs, relative rest. He is scheduled for left total knee replacement by Dr. Tonita Cong on 09/28/13. He has been cleared by his PCP. We again discussed the procedure itself as well as risks, complications and alternatives, including but not limited to DVT, PE, infx, bleeding, failure of procedure, need for secondary procedure including manipulation, nerve injury, ongoing pain/symptoms, anesthesia risk, even stroke or death. Also discussed typical post-op protocols, activity restrictions, need for PT, flexion/extension exercises, time out of work. Discussed need for DVT ppx post-op with Xarelto then ASA per protocol. Discussed dental ppx. Also discussed limitations post-operatively such as kneeling and squatting. All questions were answered. Patient desires to proceed with surgery as scheduled. He will remain NPO after MN the night before surgery. He did have what sounds like some oversedation issues with pain meds, low O2 sats, will try to avoid that and watch his pain med intake. Will use kefzol peri-op. Plan D/C on Xarelto x 2 weeks post-op for DVT ppx. He plans to return home with HHPT which he did last time without issues then convert to outpt PT after his follow up with [place] 2 weeks post-op. Plan to return to work at the [employer] 6 weeks post-op, he can work from home prior to that, and we discussed travel restrictions as well. He will follow up 10-14 days post-op for staple removal and xrays and will call with any questions or concerns in the interim.  Signed electronically by Lacie Draft, PA-C for Dr. Tonita Cong

## 2013-09-28 ENCOUNTER — Encounter (HOSPITAL_COMMUNITY)
Admission: RE | Disposition: A | Discharge status: Home Health | Payer: Self-pay | Source: Ambulatory Visit | Attending: Specialist

## 2013-09-28 ENCOUNTER — Encounter (HOSPITAL_COMMUNITY): Payer: BC Managed Care – PPO | Admitting: Certified Registered Nurse Anesthetist

## 2013-09-28 ENCOUNTER — Inpatient Hospital Stay (HOSPITAL_COMMUNITY)
Admission: RE | Admit: 2013-09-28 | Discharge: 2013-10-01 | DRG: 470 | Disposition: A | Discharge status: Home Health | Payer: BC Managed Care – PPO | Source: Ambulatory Visit | Attending: Specialist | Admitting: Specialist

## 2013-09-28 ENCOUNTER — Encounter (HOSPITAL_COMMUNITY): Payer: Self-pay | Admitting: *Deleted

## 2013-09-28 ENCOUNTER — Inpatient Hospital Stay (HOSPITAL_COMMUNITY): Payer: BC Managed Care – PPO | Admitting: Certified Registered Nurse Anesthetist

## 2013-09-28 ENCOUNTER — Inpatient Hospital Stay (HOSPITAL_COMMUNITY): Payer: BC Managed Care – PPO

## 2013-09-28 DIAGNOSIS — E7800 Pure hypercholesterolemia, unspecified: Secondary | ICD-10-CM | POA: Diagnosis present

## 2013-09-28 DIAGNOSIS — E039 Hypothyroidism, unspecified: Secondary | ICD-10-CM | POA: Diagnosis present

## 2013-09-28 DIAGNOSIS — Z6832 Body mass index (BMI) 32.0-32.9, adult: Secondary | ICD-10-CM

## 2013-09-28 DIAGNOSIS — M1710 Unilateral primary osteoarthritis, unspecified knee: Principal | ICD-10-CM | POA: Diagnosis present

## 2013-09-28 DIAGNOSIS — I10 Essential (primary) hypertension: Secondary | ICD-10-CM | POA: Diagnosis present

## 2013-09-28 DIAGNOSIS — M25569 Pain in unspecified knee: Secondary | ICD-10-CM | POA: Diagnosis present

## 2013-09-28 DIAGNOSIS — E785 Hyperlipidemia, unspecified: Secondary | ICD-10-CM | POA: Diagnosis present

## 2013-09-28 DIAGNOSIS — M1712 Unilateral primary osteoarthritis, left knee: Secondary | ICD-10-CM

## 2013-09-28 DIAGNOSIS — K219 Gastro-esophageal reflux disease without esophagitis: Secondary | ICD-10-CM | POA: Diagnosis present

## 2013-09-28 HISTORY — PX: TOTAL KNEE ARTHROPLASTY: SHX125

## 2013-09-28 LAB — TYPE AND SCREEN
ABO/RH(D): O POS
Antibody Screen: NEGATIVE

## 2013-09-28 LAB — APTT: APTT: 38 s — AB (ref 24–37)

## 2013-09-28 SURGERY — ARTHROPLASTY, KNEE, TOTAL
Anesthesia: General | Site: Knee | Laterality: Left

## 2013-09-28 MED ORDER — MIDAZOLAM HCL 2 MG/2ML IJ SOLN
INTRAMUSCULAR | Status: AC
  Filled 2013-09-28: qty 2

## 2013-09-28 MED ORDER — DEXAMETHASONE SODIUM PHOSPHATE 10 MG/ML IJ SOLN
INTRAMUSCULAR | Status: AC
  Filled 2013-09-28: qty 1

## 2013-09-28 MED ORDER — EPHEDRINE SULFATE 50 MG/ML IJ SOLN
INTRAMUSCULAR | Status: DC | PRN
  Administered 2013-09-28: 5 mg via INTRAVENOUS

## 2013-09-28 MED ORDER — FENTANYL CITRATE 0.05 MG/ML IJ SOLN
INTRAMUSCULAR | Status: AC
  Filled 2013-09-28: qty 2

## 2013-09-28 MED ORDER — CEFAZOLIN SODIUM-DEXTROSE 2-3 GM-% IV SOLR
2.0000 g | Freq: Four times a day (QID) | INTRAVENOUS | Status: AC
  Administered 2013-09-28 – 2013-09-29 (×2): 2 g via INTRAVENOUS
  Filled 2013-09-28 (×3): qty 50

## 2013-09-28 MED ORDER — MIDAZOLAM HCL 5 MG/5ML IJ SOLN
INTRAMUSCULAR | Status: DC | PRN
Start: 2013-09-28 — End: 2013-09-28
  Administered 2013-09-28: 2 mg via INTRAVENOUS

## 2013-09-28 MED ORDER — PROPOFOL 10 MG/ML IV BOLUS
INTRAVENOUS | Status: AC
  Filled 2013-09-28: qty 20

## 2013-09-28 MED ORDER — SODIUM CHLORIDE 0.9 % IR SOLN
Status: DC | PRN
  Administered 2013-09-28: 14:00:00

## 2013-09-28 MED ORDER — METOCLOPRAMIDE HCL 10 MG PO TABS: 5.0000 mg | ORAL_TABLET | Freq: Three times a day (TID) | ORAL | Status: DC | PRN

## 2013-09-28 MED ORDER — GLYCOPYRROLATE 0.2 MG/ML IJ SOLN
INTRAMUSCULAR | Status: AC
Start: 2013-09-28 — End: 2013-09-28
  Filled 2013-09-28: qty 4

## 2013-09-28 MED ORDER — DOCUSATE SODIUM 100 MG PO CAPS: 100.0000 mg | ORAL_CAPSULE | Freq: Two times a day (BID) | ORAL | Status: DC | PRN

## 2013-09-28 MED ORDER — FENTANYL CITRATE 0.05 MG/ML IJ SOLN
INTRAMUSCULAR | Status: AC
  Filled 2013-09-28: qty 5

## 2013-09-28 MED ORDER — RISAQUAD PO CAPS
1.0000 | ORAL_CAPSULE | Freq: Every day | ORAL | Status: DC
Start: 2013-09-29 — End: 2013-10-01
  Administered 2013-09-29 – 2013-10-01 (×3): 1 via ORAL
  Filled 2013-09-28 (×3): qty 1

## 2013-09-28 MED ORDER — RIVAROXABAN 10 MG PO TABS
10.0000 mg | ORAL_TABLET | Freq: Every day | ORAL | Status: DC
  Administered 2013-09-29 – 2013-10-01 (×3): 10 mg via ORAL
  Filled 2013-09-28 (×4): qty 1

## 2013-09-28 MED ORDER — ONDANSETRON HCL 4 MG PO TABS: 4.0000 mg | ORAL_TABLET | Freq: Four times a day (QID) | ORAL | Status: DC | PRN

## 2013-09-28 MED ORDER — METOPROLOL TARTRATE 50 MG PO TABS
50.0000 mg | ORAL_TABLET | Freq: Every day | ORAL | Status: DC
  Administered 2013-09-28 – 2013-09-30 (×3): 50 mg via ORAL
  Filled 2013-09-28 (×4): qty 1

## 2013-09-28 MED ORDER — OXYCODONE HCL 5 MG PO TABS
5.0000 mg | ORAL_TABLET | ORAL | Status: DC | PRN
  Administered 2013-09-28 – 2013-10-01 (×13): 10 mg via ORAL
  Administered 2013-10-01 (×2): 5 mg via ORAL
  Filled 2013-09-28 (×7): qty 2
  Filled 2013-09-28: qty 1
  Filled 2013-09-28 (×5): qty 2
  Filled 2013-09-28: qty 1
  Filled 2013-09-28: qty 2

## 2013-09-28 MED ORDER — PHENYLEPHRINE HCL 10 MG/ML IJ SOLN
INTRAMUSCULAR | Status: DC | PRN
  Administered 2013-09-28: 40 ug via INTRAVENOUS

## 2013-09-28 MED ORDER — ONDANSETRON HCL 4 MG/2ML IJ SOLN
4.0000 mg | Freq: Four times a day (QID) | INTRAMUSCULAR | Status: DC | PRN
Start: 2013-09-28 — End: 2013-10-01

## 2013-09-28 MED ORDER — NITROGLYCERIN 0.4 MG SL SUBL: 0.4000 mg | SUBLINGUAL_TABLET | SUBLINGUAL | Status: DC | PRN

## 2013-09-28 MED ORDER — HYDROMORPHONE HCL PF 1 MG/ML IJ SOLN
INTRAMUSCULAR | Status: DC | PRN
  Administered 2013-09-28 (×2): 0.5 mg via INTRAVENOUS
  Administered 2013-09-28: 1 mg via INTRAVENOUS

## 2013-09-28 MED ORDER — AMLODIPINE-OLMESARTAN 10-40 MG PO TABS: 1.0000 | ORAL_TABLET | Freq: Every day | ORAL | Status: DC

## 2013-09-28 MED ORDER — PHENOL 1.4 % MT LIQD: 1.0000 | OROMUCOSAL | Status: DC | PRN

## 2013-09-28 MED ORDER — MENTHOL 3 MG MT LOZG
1.0000 | LOZENGE | OROMUCOSAL | Status: DC | PRN
Start: 2013-09-28 — End: 2013-10-01

## 2013-09-28 MED ORDER — IRBESARTAN 300 MG PO TABS
300.0000 mg | ORAL_TABLET | Freq: Every day | ORAL | Status: DC
  Administered 2013-09-29 – 2013-09-30 (×2): 300 mg via ORAL
  Filled 2013-09-28 (×3): qty 1

## 2013-09-28 MED ORDER — PANTOPRAZOLE SODIUM 40 MG PO TBEC
40.0000 mg | DELAYED_RELEASE_TABLET | Freq: Every day | ORAL | Status: DC
  Administered 2013-09-28 – 2013-10-01 (×4): 40 mg via ORAL
  Filled 2013-09-28 (×4): qty 1

## 2013-09-28 MED ORDER — HYDROMORPHONE HCL PF 1 MG/ML IJ SOLN
INTRAMUSCULAR | Status: AC
  Filled 2013-09-28: qty 1

## 2013-09-28 MED ORDER — HYDROMORPHONE HCL PF 2 MG/ML IJ SOLN
INTRAMUSCULAR | Status: AC
  Filled 2013-09-28: qty 1

## 2013-09-28 MED ORDER — KCL IN DEXTROSE-NACL 20-5-0.45 MEQ/L-%-% IV SOLN
INTRAVENOUS | Status: AC
  Administered 2013-09-28 – 2013-09-29 (×2): via INTRAVENOUS
  Filled 2013-09-28 (×3): qty 1000

## 2013-09-28 MED ORDER — ADULT MULTIVITAMIN W/MINERALS CH
1.0000 | ORAL_TABLET | Freq: Every day | ORAL | Status: DC
Start: 2013-09-29 — End: 2013-10-01
  Administered 2013-09-29 – 2013-10-01 (×3): 1 via ORAL
  Filled 2013-09-28 (×3): qty 1

## 2013-09-28 MED ORDER — ACETAMINOPHEN 650 MG RE SUPP: 650.0000 mg | Freq: Four times a day (QID) | RECTAL | Status: DC | PRN

## 2013-09-28 MED ORDER — LACTATED RINGERS IV SOLN: INTRAVENOUS | Status: DC

## 2013-09-28 MED ORDER — METOCLOPRAMIDE HCL 5 MG/ML IJ SOLN: 5.0000 mg | Freq: Three times a day (TID) | INTRAMUSCULAR | Status: DC | PRN

## 2013-09-28 MED ORDER — HYDROMORPHONE HCL PF 1 MG/ML IJ SOLN: 1.0000 mg | INTRAMUSCULAR | Status: DC | PRN

## 2013-09-28 MED ORDER — NEOSTIGMINE METHYLSULFATE 10 MG/10ML IV SOLN
INTRAVENOUS | Status: AC
  Filled 2013-09-28: qty 1

## 2013-09-28 MED ORDER — LEVOTHYROXINE SODIUM 50 MCG PO TABS
50.0000 ug | ORAL_TABLET | Freq: Every day | ORAL | Status: DC
Start: 2013-09-28 — End: 2013-10-01
  Administered 2013-09-28 – 2013-09-30 (×3): 50 ug via ORAL
  Filled 2013-09-28 (×4): qty 1

## 2013-09-28 MED ORDER — LIDOCAINE HCL (CARDIAC) 20 MG/ML IV SOLN
INTRAVENOUS | Status: DC | PRN
  Administered 2013-09-28: 100 mg via INTRAVENOUS

## 2013-09-28 MED ORDER — SODIUM CHLORIDE 0.45 % IV SOLN
INTRAVENOUS | Status: AC
  Administered 2013-09-28: 17:00:00 via INTRAVENOUS

## 2013-09-28 MED ORDER — CEFAZOLIN SODIUM-DEXTROSE 2-3 GM-% IV SOLR
INTRAVENOUS | Status: AC
  Filled 2013-09-28: qty 50

## 2013-09-28 MED ORDER — VITAMIN C 500 MG PO TABS
500.0000 mg | ORAL_TABLET | Freq: Every day | ORAL | Status: DC
  Administered 2013-09-29 – 2013-10-01 (×3): 500 mg via ORAL
  Filled 2013-09-28 (×3): qty 1

## 2013-09-28 MED ORDER — DOCUSATE SODIUM 100 MG PO CAPS
100.0000 mg | ORAL_CAPSULE | Freq: Two times a day (BID) | ORAL | Status: DC
  Administered 2013-09-28 – 2013-10-01 (×6): 100 mg via ORAL

## 2013-09-28 MED ORDER — LACTATED RINGERS IV SOLN
INTRAVENOUS | Status: DC
  Administered 2013-09-28 (×3): via INTRAVENOUS
  Administered 2013-09-28: 1000 mL via INTRAVENOUS

## 2013-09-28 MED ORDER — NEOSTIGMINE METHYLSULFATE 10 MG/10ML IV SOLN
INTRAVENOUS | Status: DC | PRN
  Administered 2013-09-28: 4 mg via INTRAVENOUS

## 2013-09-28 MED ORDER — FENTANYL CITRATE 0.05 MG/ML IJ SOLN
INTRAMUSCULAR | Status: DC | PRN
  Administered 2013-09-28 (×9): 50 ug via INTRAVENOUS

## 2013-09-28 MED ORDER — METHOCARBAMOL 1000 MG/10ML IJ SOLN
500.0000 mg | Freq: Four times a day (QID) | INTRAVENOUS | Status: DC | PRN
  Administered 2013-09-28: 500 mg via INTRAVENOUS
  Filled 2013-09-28 (×2): qty 5

## 2013-09-28 MED ORDER — ROCURONIUM BROMIDE 100 MG/10ML IV SOLN
INTRAVENOUS | Status: AC
  Filled 2013-09-28: qty 1

## 2013-09-28 MED ORDER — OXYCODONE-ACETAMINOPHEN 7.5-325 MG PO TABS: 1.0000 | ORAL_TABLET | ORAL | Status: DC | PRN

## 2013-09-28 MED ORDER — GLYCOPYRROLATE 0.2 MG/ML IJ SOLN
INTRAMUSCULAR | Status: DC | PRN
  Administered 2013-09-28: 0.6 mg via INTRAVENOUS
  Administered 2013-09-28: 0.2 mg via INTRAVENOUS

## 2013-09-28 MED ORDER — PHENYLEPHRINE 40 MCG/ML (10ML) SYRINGE FOR IV PUSH (FOR BLOOD PRESSURE SUPPORT)
PREFILLED_SYRINGE | INTRAVENOUS | Status: AC
  Filled 2013-09-28: qty 10

## 2013-09-28 MED ORDER — SODIUM CHLORIDE 0.9 % IR SOLN
Status: DC | PRN
  Administered 2013-09-28: 2000 mL

## 2013-09-28 MED ORDER — ACETAMINOPHEN 10 MG/ML IV SOLN
1000.0000 mg | Freq: Once | INTRAVENOUS | Status: AC
  Administered 2013-09-28: 1000 mg via INTRAVENOUS
  Filled 2013-09-28: qty 100

## 2013-09-28 MED ORDER — METHOCARBAMOL 500 MG PO TABS: 500.0000 mg | ORAL_TABLET | Freq: Three times a day (TID) | ORAL | Status: DC | PRN

## 2013-09-28 MED ORDER — HYDROMORPHONE HCL PF 1 MG/ML IJ SOLN
0.2500 mg | INTRAMUSCULAR | Status: DC | PRN
  Administered 2013-09-28 (×4): 0.25 mg via INTRAVENOUS

## 2013-09-28 MED ORDER — EPHEDRINE SULFATE 50 MG/ML IJ SOLN
INTRAMUSCULAR | Status: AC
  Filled 2013-09-28: qty 1

## 2013-09-28 MED ORDER — CEFAZOLIN SODIUM-DEXTROSE 2-3 GM-% IV SOLR
2.0000 g | INTRAVENOUS | Status: AC
  Administered 2013-09-28: 2 g via INTRAVENOUS

## 2013-09-28 MED ORDER — LIDOCAINE HCL (CARDIAC) 20 MG/ML IV SOLN
INTRAVENOUS | Status: AC
  Filled 2013-09-28: qty 5

## 2013-09-28 MED ORDER — DEXAMETHASONE SODIUM PHOSPHATE 10 MG/ML IJ SOLN
INTRAMUSCULAR | Status: DC | PRN
  Administered 2013-09-28: 10 mg via INTRAVENOUS

## 2013-09-28 MED ORDER — PROPOFOL 10 MG/ML IV BOLUS
INTRAVENOUS | Status: DC | PRN
  Administered 2013-09-28: 200 mg via INTRAVENOUS

## 2013-09-28 MED ORDER — AMLODIPINE BESYLATE 10 MG PO TABS
10.0000 mg | ORAL_TABLET | Freq: Every day | ORAL | Status: DC
  Administered 2013-09-29 – 2013-09-30 (×2): 10 mg via ORAL
  Filled 2013-09-28 (×3): qty 1

## 2013-09-28 MED ORDER — SUCCINYLCHOLINE CHLORIDE 20 MG/ML IJ SOLN
INTRAMUSCULAR | Status: DC | PRN
  Administered 2013-09-28: 100 mg via INTRAVENOUS

## 2013-09-28 MED ORDER — METHOCARBAMOL 500 MG PO TABS
500.0000 mg | ORAL_TABLET | Freq: Four times a day (QID) | ORAL | Status: DC | PRN
Start: 2013-09-28 — End: 2013-10-01
  Administered 2013-09-29 – 2013-10-01 (×7): 500 mg via ORAL
  Filled 2013-09-28 (×7): qty 1

## 2013-09-28 MED ORDER — BUPIVACAINE LIPOSOME 1.3 % IJ SUSP
20.0000 mL | Freq: Once | INTRAMUSCULAR | Status: AC
  Administered 2013-09-28: 20 mL
  Filled 2013-09-28: qty 20

## 2013-09-28 MED ORDER — ROCURONIUM BROMIDE 100 MG/10ML IV SOLN
INTRAVENOUS | Status: DC | PRN
  Administered 2013-09-28: 10 mg via INTRAVENOUS
  Administered 2013-09-28: 50 mg via INTRAVENOUS

## 2013-09-28 MED ORDER — RIVAROXABAN 10 MG PO TABS: 10.0000 mg | ORAL_TABLET | Freq: Every day | ORAL | Status: DC

## 2013-09-28 MED ORDER — ACETAMINOPHEN 325 MG PO TABS
650.0000 mg | ORAL_TABLET | Freq: Four times a day (QID) | ORAL | Status: DC | PRN
  Administered 2013-09-29 – 2013-09-30 (×4): 650 mg via ORAL
  Filled 2013-09-28 (×5): qty 2

## 2013-09-28 SURGICAL SUPPLY — 69 items
BAG ZIPLOCK 12X15 (MISCELLANEOUS) IMPLANT
BANDAGE ELASTIC 4 VELCRO ST LF (GAUZE/BANDAGES/DRESSINGS) ×2 IMPLANT
BANDAGE ELASTIC 6 VELCRO ST LF (GAUZE/BANDAGES/DRESSINGS) ×2 IMPLANT
BANDAGE ESMARK 6X9 LF (GAUZE/BANDAGES/DRESSINGS) ×1 IMPLANT
BLADE SAG 18X100X1.27 (BLADE) ×2 IMPLANT
BLADE SAW SGTL 13.0X1.19X90.0M (BLADE) ×2 IMPLANT
BNDG ESMARK 6X9 LF (GAUZE/BANDAGES/DRESSINGS) ×2
CAPT RP KNEE ×2 IMPLANT
CEMENT HV SMART SET (Cement) ×2 IMPLANT
CHLORAPREP W/TINT 26ML (MISCELLANEOUS) IMPLANT
CLOTH 2% CHLOROHEXIDINE 3PK (PERSONAL CARE ITEMS) ×2 IMPLANT
CUFF TOURN SGL QUICK 34 (TOURNIQUET CUFF) ×1
CUFF TRNQT CYL 34X4X40X1 (TOURNIQUET CUFF) ×1 IMPLANT
DRAPE INCISE IOBAN 66X45 STRL (DRAPES) IMPLANT
DRAPE LG THREE QUARTER DISP (DRAPES) ×2 IMPLANT
DRAPE ORTHO SPLIT 77X108 STRL (DRAPES) ×2
DRAPE POUCH INSTRU U-SHP 10X18 (DRAPES) ×2 IMPLANT
DRAPE SURG ORHT 6 SPLT 77X108 (DRAPES) ×2 IMPLANT
DRAPE U-SHAPE 47X51 STRL (DRAPES) ×2 IMPLANT
DRSG ADAPTIC 3X8 NADH LF (GAUZE/BANDAGES/DRESSINGS) IMPLANT
DRSG AQUACEL AG ADV 3.5X10 (GAUZE/BANDAGES/DRESSINGS) ×2 IMPLANT
DRSG PAD ABDOMINAL 8X10 ST (GAUZE/BANDAGES/DRESSINGS) IMPLANT
DRSG TEGADERM 4X4.75 (GAUZE/BANDAGES/DRESSINGS) ×2 IMPLANT
DURAPREP 26ML APPLICATOR (WOUND CARE) ×2 IMPLANT
ELECT REM PT RETURN 9FT ADLT (ELECTROSURGICAL) ×2
ELECTRODE REM PT RTRN 9FT ADLT (ELECTROSURGICAL) ×1 IMPLANT
EVACUATOR 1/8 PVC DRAIN (DRAIN) ×2 IMPLANT
FACESHIELD WRAPAROUND (MASK) ×10 IMPLANT
GAUZE SPONGE 2X2 8PLY STRL LF (GAUZE/BANDAGES/DRESSINGS) ×1 IMPLANT
GAUZE SPONGE 4X4 12PLY STRL (GAUZE/BANDAGES/DRESSINGS) ×2 IMPLANT
GLOVE BIOGEL PI IND STRL 7.5 (GLOVE) ×1 IMPLANT
GLOVE BIOGEL PI IND STRL 8 (GLOVE) ×1 IMPLANT
GLOVE BIOGEL PI INDICATOR 7.5 (GLOVE) ×1
GLOVE BIOGEL PI INDICATOR 8 (GLOVE) ×1
GLOVE SURG SS PI 7.5 STRL IVOR (GLOVE) ×2 IMPLANT
GLOVE SURG SS PI 8.0 STRL IVOR (GLOVE) ×4 IMPLANT
GOWN STRL REUS W/TWL XL LVL3 (GOWN DISPOSABLE) ×4 IMPLANT
HANDPIECE INTERPULSE COAX TIP (DISPOSABLE) ×1
IMMOBILIZER KNEE 20 (SOFTGOODS) ×2 IMPLANT
IMMOBILIZER KNEE 20 THIGH 36 (SOFTGOODS) IMPLANT
KIT BASIN OR (CUSTOM PROCEDURE TRAY) ×2 IMPLANT
MANIFOLD NEPTUNE II (INSTRUMENTS) ×2 IMPLANT
NDL SAFETY ECLIPSE 18X1.5 (NEEDLE) ×1 IMPLANT
NEEDLE HYPO 18GX1.5 SHARP (NEEDLE) ×1
NS IRRIG 1000ML POUR BTL (IV SOLUTION) IMPLANT
PACK TOTAL JOINT (CUSTOM PROCEDURE TRAY) ×2 IMPLANT
PADDING CAST COTTON 6X4 STRL (CAST SUPPLIES) ×2 IMPLANT
POSITIONER SURGICAL ARM (MISCELLANEOUS) ×2 IMPLANT
SET HNDPC FAN SPRY TIP SCT (DISPOSABLE) ×1 IMPLANT
SPONGE GAUZE 2X2 STER 10/PKG (GAUZE/BANDAGES/DRESSINGS) ×1
SPONGE SURGIFOAM ABS GEL 100 (HEMOSTASIS) IMPLANT
STAPLER VISISTAT (STAPLE) ×2 IMPLANT
STRIP CLOSURE SKIN 1/2X4 (GAUZE/BANDAGES/DRESSINGS) IMPLANT
SUCTION FRAZIER 12FR DISP (SUCTIONS) ×2 IMPLANT
SUT BONE WAX W31G (SUTURE) IMPLANT
SUT MNCRL AB 4-0 PS2 18 (SUTURE) IMPLANT
SUT VIC AB 1 CT1 27 (SUTURE) ×2
SUT VIC AB 1 CT1 27XBRD ANTBC (SUTURE) ×2 IMPLANT
SUT VIC AB 1 CT1 36 (SUTURE) ×2 IMPLANT
SUT VIC AB 2-0 CT1 27 (SUTURE) ×3
SUT VIC AB 2-0 CT1 TAPERPNT 27 (SUTURE) ×3 IMPLANT
SUT VLOC 180 0 24IN GS25 (SUTURE) ×2 IMPLANT
SYRINGE 20CC LL (MISCELLANEOUS) ×2 IMPLANT
TOWEL OR 17X26 10 PK STRL BLUE (TOWEL DISPOSABLE) ×2 IMPLANT
TOWEL OR NON WOVEN STRL DISP B (DISPOSABLE) IMPLANT
TOWER CARTRIDGE SMART MIX (DISPOSABLE) ×2 IMPLANT
TRAY FOLEY CATH 14FRSI W/METER (CATHETERS) ×2 IMPLANT
WATER STERILE IRR 1500ML POUR (IV SOLUTION) ×2 IMPLANT
WRAP KNEE MAXI GEL POST OP (GAUZE/BANDAGES/DRESSINGS) ×2 IMPLANT

## 2013-09-28 NOTE — Transfer of Care (Signed)
Immediate Anesthesia Transfer of Care Note  Patient: [name]  Procedure(s) Performed: Procedure(s) (LRB): LEFT TOTAL KNEE ARTHROPLASTY (Left)  Patient Location: PACU  Anesthesia Type: General  Level of Consciousness: sedated, patient cooperative and responds to stimulation  Airway & Oxygen Therapy: Patient Spontanous Breathing and Patient connected to face mask oxgen  Post-op Assessment: Report given to PACU RN and Post -op Vital signs reviewed and stable  Post vital signs: Reviewed and stable  Complications: No apparent anesthesia complications

## 2013-09-28 NOTE — Interval H&P Note (Signed)
History and Physical Interval Note:  09/28/2013 12:58 PM  [name]  has presented today for surgery, with the diagnosis of DJD left knee  The various methods of treatment have been discussed with the patient and family. After consideration of risks, benefits and other options for treatment, the patient has consented to  Procedure(s): LEFT TOTAL KNEE ARTHROPLASTY (Left) as a surgical intervention .  The patient's history has been reviewed, patient examined, no change in status, stable for surgery.  I have reviewed the patient's chart and labs.  Questions were answered to the patient's satisfaction.     [name]

## 2013-09-28 NOTE — Brief Op Note (Signed)
09/28/2013  3:26 PM  PATIENT:  [name]  68 y.o. male  PRE-OPERATIVE DIAGNOSIS:  DJD left knee  POST-OPERATIVE DIAGNOSIS:  DJD left knee  PROCEDURE:  Procedure(s): LEFT TOTAL KNEE ARTHROPLASTY (Left)  SURGEON:  Surgeon(s) and Role:    * Johnn Hai, MD - Primary [place] PHYSICIAN ASSISTANT:   ASSISTANTS:    ANESTHESIA:   general  EBL:  Total I/O In: 2000 [I.V.:2000] Out: 50 [Blood:50]  BLOOD ADMINISTERED:none  DRAINS: (1) Hemovact drain(s) in the 1 with  Suction Open   LOCAL MEDICATIONS USED:  MARCAINE     SPECIMEN:  No Specimen  DISPOSITION OF SPECIMEN:  N/A  COUNTS:  YES  TOURNIQUET:   Total Tourniquet Time Documented: Thigh (Left) - 109 minutes Total: Thigh (Left) - 109 minutes   DICTATION: .Other Dictation: Dictation Number [id number]  PLAN OF CARE: Admit to inpatient   PATIENT DISPOSITION:  PACU - hemodynamically stable.   Delay start of Pharmacological VTE agent (>24hrs) due to surgical blood loss or risk of bleeding: no

## 2013-09-28 NOTE — H&P (View-Only) (Signed)
[name] DOB: May 19, 1945 Married / Language: English / Race: White Male  H&P date: 09/25/13  Chief complaint: Left knee pain  History of Present Illness The patient is a 68 year old male who comes in today for a preoperative history and physical. The patient is scheduled for a left total knee arthroplasty to be performed by Dr. Johnn Hai, MD at [facility] on 09/28/2013. [name] has had worsening L knee pain x approx 8 months, refractory to steroid injections, viscosupplementation, quad strengthening, activity modifications, relative rest, bracing, arch supports, ice and elevation, NSAIDs, arthroscopy (5/15, grade 3 and 4 changes).  Dr. Tonita Cong and the patient have mutually agreed to with total knee replacement. Discussed the procedure itself as well as risks, complications and alternatives, including but not limited to DVT, PE, infx, bleeding, failure of procedure, need for secondary procedure including manipulation, nerve injury, ongoing pain/symptoms, anesthesia risk, even stroke or death. Also discussed typical post-op protocols, activity restrictions, need for PT, flexion/extension exercises, time out of work. Discussed need for DVT ppx post-op with Xarelto then ASA per protocol. Discussed dental ppx. Also discussed limitations post-operatively such as kneeling and squatting. All questions were answered. Patient desires to proceed with surgery.  He has been cleared by his PCP. He is holding supplements, vitamins, NSAIDs, ASA accordingly.  Problem List/Past Medical Hx Hypercholesterolemia High blood pressure  Allergies No Known Drug Allergies08/23/2012  Family History  Hypertension First Degree Relatives. mother First Degree Relatives  Social History Tobacco use Never smoker. never smoker Living situation live with spouse- 2 level house with option to stay on 1st level. 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 Glucosamine Complex (Oral) Active. Acyclovir (Oral) Specific dose unknown - Active. Azor (Oral) Specific dose unknown - Active. Metoprolol Succinate (Oral) Specific dose unknown - Active. Nitrostat (Sublingual) Specific dose unknown - Active. Omeprazole (Oral) Specific dose unknown - Active. ASA Buff (Mag Carb-Al Glyc) (Oral) Specific dose unknown - Active. Vitamins & Minerals (Oral) Specific dose unknown - Active. Aleve (220MG  Capsule, Oral) Active. Norco (5-325MG  Tablet, Oral) Active. (rarely) Medications Reconciled  Past Surgical History Total Knee Replacement right Arthroscopic Knee Surgery - Left Other Surgery jaw reconstruction 1980  Review of Systems General Not Present- Chills, Fatigue, Fever, Memory Loss, Night Sweats, Weight Gain and Weight Loss. Skin Not Present- Eczema, Hives, Itching, Lesions and Rash. HEENT Not Present- Dentures, Double Vision, Headache, Hearing Loss, Tinnitus and Visual Loss. Respiratory Not Present- Allergies, Chronic Cough, Coughing up blood, Shortness of breath at rest and Shortness of breath with exertion. Cardiovascular Not Present- Chest Pain, Difficulty Breathing Lying Down, Murmur, Palpitations, Racing/skipping heartbeats and Swelling. Gastrointestinal Not Present- Abdominal Pain, Bloody Stool, Constipation, Diarrhea, Difficulty Swallowing, Heartburn, Jaundice, Loss of appetitie, Nausea and Vomiting. Male Genitourinary Not Present- Blood in Urine, Discharge, Flank Pain, Incontinence, Painful Urination, Urgency, Urinary  frequency, Urinary Retention, Urinating at Night and Weak urinary stream. Musculoskeletal Present- Morning Stiffness and Muscle Pain. Not Present- Back Pain, Joint Pain, Joint Swelling, Muscle Weakness and Spasms. Neurological Not Present- Blackout spells, Difficulty with balance, Dizziness, Paralysis, Tremor and Weakness. Psychiatric Not Present- Insomnia.  Physical Exam General Mental Status -Alert, cooperative and good historian. General Appearance-pleasant, Not in acute distress. Orientation-Oriented X3. Build & Nutrition-Well nourished and Well developed.  Head and Neck Head-normocephalic, atraumatic . Neck Global Assessment - supple, no bruit auscultated on the right, no bruit auscultated on the left.  Eye Pupil - Bilateral-Regular and Round. Motion - Bilateral-EOMI.  Chest and Lung Exam Auscultation Breath sounds - clear at anterior chest wall and clear at posterior chest wall. Adventitious sounds - No Adventitious sounds.  Cardiovascular Auscultation Rhythm - Regular rate and rhythm. Heart Sounds - S1 WNL and S2 WNL. Murmurs & Other Heart Sounds - Auscultation of the heart reveals - No Murmurs.  Abdomen Palpation/Percussion Tenderness - Abdomen is non-tender to palpation. Rigidity (guarding) - Abdomen is soft. Auscultation Auscultation of the abdomen reveals - Bowel sounds normal.  Male Genitourinary Note: Not done, not pertinent to present illness  Musculoskeletal Note: On exam he is tender along the medial joint line. Patellofemoral pain with compression. Moderate effusion, 0 to 120. No instability. Ipsilateral hip and ankle exams are unremarkable.  Imaging AP standing and lateral demonstrates bone on bone arthrosis medial compartment.  Assessment & Plan  DJD left knee  Pt with L knee DJD, end-stage, bone-on-bone, refractory to conservative tx including steroid injections, viscosupplementation, arthroscopic debridement, quad strengthening,  activity modifications, bracing, ice and elevation, NSAIDs, relative rest. He is scheduled for left total knee replacement by Dr. Tonita Cong on 09/28/13. He has been cleared by his PCP. We again discussed the procedure itself as well as risks, complications and alternatives, including but not limited to DVT, PE, infx, bleeding, failure of procedure, need for secondary procedure including manipulation, nerve injury, ongoing pain/symptoms, anesthesia risk, even stroke or death. Also discussed typical post-op protocols, activity restrictions, need for PT, flexion/extension exercises, time out of work. Discussed need for DVT ppx post-op with Xarelto then ASA per protocol. Discussed dental ppx. Also discussed limitations post-operatively such as kneeling and squatting. All questions were answered. Patient desires to proceed with surgery as scheduled. He will remain NPO after MN the night before surgery. He did have what sounds like some oversedation issues with pain meds, low O2 sats, will try to avoid that and watch his pain med intake. Will use kefzol peri-op. Plan D/C on Xarelto x 2 weeks post-op for DVT ppx. He plans to return home with HHPT which he did last time without issues then convert to outpt PT after his follow up with [place] 2 weeks post-op. Plan to return to work at the [employer] 6 weeks post-op, he can work from home prior to that, and we discussed travel restrictions as well. He will follow up 10-14 days post-op for staple removal and xrays and will call with any questions or concerns in the interim.  Signed electronically by Lacie Draft, PA-C for Dr. Tonita Cong

## 2013-09-28 NOTE — Anesthesia Postprocedure Evaluation (Signed)
  Anesthesia Post-op Note  Patient: [name]  Procedure(s) Performed: Procedure(s) (LRB): LEFT TOTAL KNEE ARTHROPLASTY (Left)  Patient Location: PACU  Anesthesia Type: General  Level of Consciousness: awake and alert   Airway and Oxygen Therapy: Patient Spontanous Breathing  Post-op Pain: mild  Post-op Assessment: Post-op Vital signs reviewed, Patient's Cardiovascular Status Stable, Respiratory Function Stable, Patent Airway and No signs of Nausea or vomiting  Last Vitals:  Filed Vitals:   09/28/13 1545  BP: 192/96  Pulse:   Temp: 36.1 C  Resp: 14    Post-op Vital Signs: stable   Complications: No apparent anesthesia complications

## 2013-09-28 NOTE — Anesthesia Preprocedure Evaluation (Signed)
Anesthesia Evaluation  Patient identified by MRN, date of birth, ID band Patient awake    Reviewed: Allergy & Precautions, H&P , NPO status , Patient's Chart, lab work & pertinent test results, reviewed documented beta blocker date and time   Airway Mallampati: III TM Distance: >3 FB Neck ROM: Full    Dental no notable dental hx. (+) Chipped, Implants, Dental Advisory Given A few small chips upper front and implants lower:   Pulmonary neg pulmonary ROS,  breath sounds clear to auscultation  Pulmonary exam normal       Cardiovascular hypertension, Pt. on medications and Pt. on home beta blockers Rhythm:Regular Rate:Normal     Neuro/Psych negative neurological ROS  negative psych ROS   GI/Hepatic negative GI ROS, Neg liver ROS, GERD-  Medicated and Controlled,  Endo/Other  negative endocrine ROSHypothyroidism   Renal/GU negative Renal ROS  negative genitourinary   Musculoskeletal negative musculoskeletal ROS (+)   Abdominal   Peds negative pediatric ROS (+)  Hematology negative hematology ROS (+)   Anesthesia Other Findings Multiple lower teeth capped  Reproductive/Obstetrics negative OB ROS                           Anesthesia Physical Anesthesia Plan  ASA: III  Anesthesia Plan: General   Post-op Pain Management:    Induction: Intravenous  Airway Management Planned: Oral ETT  Additional Equipment:   Intra-op Plan:   Post-operative Plan: Extubation in OR  Informed Consent: I have reviewed the patients History and Physical, chart, labs and discussed the procedure including the risks, benefits and alternatives for the proposed anesthesia with the patient or authorized representative who has indicated his/her understanding and acceptance.   Dental Advisory Given  Plan Discussed with: CRNA and Surgeon  Anesthesia Plan Comments:         Anesthesia Quick Evaluation

## 2013-09-29 LAB — CBC
HCT: 44.2 % (ref 39.0–52.0)
Hemoglobin: 15.2 g/dL (ref 13.0–17.0)
MCH: 31.3 pg (ref 26.0–34.0)
MCHC: 34.4 g/dL (ref 30.0–36.0)
MCV: 91.1 fL (ref 78.0–100.0)
PLATELETS: 203 10*3/uL (ref 150–400)
RBC: 4.85 MIL/uL (ref 4.22–5.81)
RDW: 13.1 % (ref 11.5–15.5)
WBC: 12.6 10*3/uL — AB (ref 4.0–10.5)

## 2013-09-29 LAB — BASIC METABOLIC PANEL
ANION GAP: 13 (ref 5–15)
BUN: 16 mg/dL (ref 6–23)
CALCIUM: 9 mg/dL (ref 8.4–10.5)
CO2: 27 mEq/L (ref 19–32)
CREATININE: 1.1 mg/dL (ref 0.50–1.35)
Chloride: 97 mEq/L (ref 96–112)
GFR, EST AFRICAN AMERICAN: 78 mL/min — AB (ref >90)
GFR, EST NON AFRICAN AMERICAN: 67 mL/min — AB (ref >90)
Glucose, Bld: 162 mg/dL — ABNORMAL HIGH (ref 70–99)
Potassium: 4.8 mEq/L (ref 3.7–5.3)
Sodium: 137 mEq/L (ref 137–147)

## 2013-09-29 MED ORDER — ZOLPIDEM TARTRATE 5 MG PO TABS
5.0000 mg | ORAL_TABLET | Freq: Once | ORAL | Status: AC
  Administered 2013-09-29: 5 mg via ORAL
  Filled 2013-09-29: qty 1

## 2013-09-29 NOTE — Progress Notes (Signed)
Subjective: 1 Day Post-Op Procedure(s) (LRB): LEFT TOTAL KNEE ARTHROPLASTY (Left) Patient reports pain as 3 on 0-10 scale. No specific post-op problems.   Objective: Vital signs in last 24 hours: Temp:  [96.9 F (36.1 C)-98.3 F (36.8 C)] 98.1 F (36.7 C) (08/15 0616) Pulse Rate:  [58-94] 58 (08/15 0616) Resp:  [14-18] 18 (08/15 0616) BP: (137-192)/(71-96) 168/73 mmHg (08/15 0616) SpO2:  [91 %-100 %] 95 % (08/15 0616) Weight:  [106.595 kg (235 lb)] 106.595 kg (235 lb) (08/14 1725)  Intake/Output from previous day: 08/14 0701 - 08/15 0700 In: 5135 [P.O.:480; I.V.:4555; IV Piggyback:100] Out: 4400 [Urine:3530; Drains:820; Blood:50] Intake/Output this shift:     Recent Labs  09/29/13 0535  HGB 15.2    Recent Labs  09/29/13 0535  WBC 12.6*  RBC 4.85  HCT 44.2  PLT 203    Recent Labs  09/29/13 0535  NA 137  K 4.8  CL 97  CO2 27  BUN 16  CREATININE 1.10  GLUCOSE 162*  CALCIUM 9.0   No results found for this basename: LABPT, INR,  in the last 72 hours  Neurologically intact  Assessment/Plan: 1 Day Post-Op Procedure(s) (LRB): LEFT TOTAL KNEE ARTHROPLASTY (Left) Up with therapy.Possible DC Sunday.  Noriko Macari A 09/29/2013, 9:10 AM

## 2013-09-29 NOTE — Evaluation (Signed)
Physical Therapy Evaluation Patient Details Name: [name] MRN: [id number] DOB: Jan 17, 1946 Today's Date: 09/29/2013   History of Present Illness  L TKR - previous R TKR  Clinical Impression  Pt s/p L TKR presents with decreased L LE strength/ROM, post-op pain and decreased flexion at R knee limiting functional mobility.  Pt should progress well to d/c home with family assist and HHPT follow up.    Follow Up Recommendations Home health PT    Equipment Recommendations  None recommended by PT    Recommendations for Other Services       Precautions / Restrictions Precautions Precautions: Knee;Fall Required Braces or Orthoses: Knee Immobilizer - Right Knee Immobilizer - Right: Discontinue once straight leg raise with < 10 degree lag Restrictions Weight Bearing Restrictions: No Other Position/Activity Restrictions: WBAT      Mobility  Bed Mobility Overal bed mobility: Needs Assistance Bed Mobility: Supine to Sit     Supine to sit: Min assist     General bed mobility comments: cues for sequence and use of R LE to self assist  Transfers Overall transfer level: Needs assistance Equipment used: Rolling walker (2 wheeled) Transfers: Sit to/from Stand Sit to Stand: Min assist         General transfer comment: cues for LE management and use of UEs to self assist  Ambulation/Gait Ambulation/Gait assistance: Min assist Ambulation Distance (Feet): 68 Feet Assistive device: Rolling walker (2 wheeled) Gait Pattern/deviations: Step-to pattern;Decreased step length - right;Decreased step length - left;Shuffle;Trunk flexed     General Gait Details: cues for sequence, posture and position from [employer]            Wheelchair Mobility    Modified Rankin (Stroke Patients Only)       Balance                                             Pertinent Vitals/Pain Pain Assessment: 0-10 Pain Score: 4  Pain Location: L knee Pain Descriptors /  Indicators: Aching;Sore Pain Intervention(s): Limited activity within patient's tolerance;Monitored during session;Patient requesting pain meds-RN notified;Ice applied    Home Living Family/patient expects to be discharged to:: Private residence Living Arrangements: Spouse/significant other Available Help at Discharge: Family Type of Home: House Home Access: Stairs to enter   Technical brewer of Steps: 1 Home Layout: One level Home Equipment: Environmental consultant - 2 wheels      Prior Function Level of Independence: Independent               Hand Dominance   Dominant Hand: Right    Extremity/Trunk Assessment   Upper Extremity Assessment: Overall WFL for tasks assessed           Lower Extremity Assessment: RLE deficits/detail;LLE deficits/detail RLE Deficits / Details: Strength WFL with AROM flexion at knee to ~70 LLE Deficits / Details: 2/5 quads with AAROM at knee -10- 50  Cervical / Trunk Assessment: Normal  Communication   Communication: No difficulties  Cognition Arousal/Alertness: Awake/alert Behavior During Therapy: WFL for tasks assessed/performed Overall Cognitive Status: Within Functional Limits for tasks assessed                      General Comments      Exercises Total Joint Exercises Ankle Circles/Pumps: AROM;Both;15 reps;Supine Quad Sets: AROM;Both;10 reps;Supine Heel Slides: AAROM;Left;10 reps;Supine Straight Leg Raises: AAROM;Left;10 reps;Supine  Assessment/Plan    PT Assessment    PT Diagnosis     PT Problem List    PT Treatment Interventions     PT Goals (Current goals can be found in the Care Plan section) Acute Rehab PT Goals Patient Stated Goal: Be able to bend this knee fully within 4 weeks PT Goal Formulation: With patient Time For Goal Achievement: 10/12/13 Potential to Achieve Goals: Good    Frequency     Barriers to discharge        Co-evaluation               End of Session Equipment Utilized  During Treatment: Gait belt;Left knee immobilizer Activity Tolerance: Patient tolerated treatment well Patient left: in chair;with call bell/phone within reach;with family/visitor present Nurse Communication: Mobility status         Time: 2353-6144 PT Time Calculation (min): 37 min   Charges:   PT Evaluation $Initial PT Evaluation Tier I: 1 Procedure PT Treatments $Gait Training: 8-22 mins $Therapeutic Exercise: 8-22 mins   PT G Codes:          [name] 09/29/2013, 3:04 PM

## 2013-09-29 NOTE — Progress Notes (Signed)
Physical Therapy Treatment Patient Details Name: [name] MRN: [id number] DOB: 1946/01/21 Today's Date: 2013/10/04    History of Present Illness L TKR - previous R TKR    PT Comments    Motivated and progressing well  Follow Up Recommendations  Home health PT     Equipment Recommendations  None recommended by PT    Recommendations for Other Services OT consult     Precautions / Restrictions Precautions Precautions: Knee;Fall Required Braces or Orthoses: Knee Immobilizer - Right Knee Immobilizer - Right: Discontinue once straight leg raise with < 10 degree lag Restrictions Weight Bearing Restrictions: No Other Position/Activity Restrictions: WBAT    Mobility  Bed Mobility Overal bed mobility: Needs Assistance Bed Mobility: Sit to Supine       Sit to supine: Min assist   General bed mobility comments: cues for sequence and use of R LE to self assist  Transfers Overall transfer level: Needs assistance Equipment used: Rolling walker (2 wheeled) Transfers: Sit to/from Stand Sit to Stand: Min assist         General transfer comment: cues for LE management and use of UEs to self assist  Ambulation/Gait Ambulation/Gait assistance: Min assist;Min guard Ambulation Distance (Feet): 70 Feet Assistive device: Rolling walker (2 wheeled) Gait Pattern/deviations: Step-to pattern;Shuffle;Trunk flexed;Decreased step length - left;Decreased step length - right     General Gait Details: cues for sequence, posture and position from [employer]            Wheelchair Mobility    Modified Rankin (Stroke Patients Only)       Balance                                    Cognition Arousal/Alertness: Awake/alert Behavior During Therapy: WFL for tasks assessed/performed Overall Cognitive Status: Within Functional Limits for tasks assessed                      Exercises      General Comments        Pertinent Vitals/Pain Pain  Assessment: 0-10 Pain Score: 7  Pain Location: L knee Pain Descriptors / Indicators: Aching;Burning Pain Intervention(s): Limited activity within patient's tolerance;Monitored during session;Patient requesting pain meds-RN notified;Ice applied    Home Living                      Prior Function            PT Goals (current goals can now be found in the care plan section) Acute Rehab PT Goals Patient Stated Goal: Be able to bend this knee fully within 4 weeks PT Goal Formulation: With patient Time For Goal Achievement: 10/12/13 Potential to Achieve Goals: Good Progress towards PT goals: Progressing toward goals    Frequency  7X/week    PT Plan Current plan remains appropriate    Co-evaluation             End of Session Equipment Utilized During Treatment: Gait belt;Left knee immobilizer Activity Tolerance: Patient tolerated treatment well;Patient limited by pain Patient left: in bed;with call bell/phone within reach;with family/visitor present     Time: 9518-8416 PT Time Calculation (min): 19 min  Charges:  $Gait Training: 8-22 mins                    G Codes:      [name] 10/04/2013, 3:13 PM

## 2013-09-29 NOTE — Op Note (Signed)
NAMEVINCENZO, [name]              ACCOUNT NO.:  [id number]  MEDICAL RECORD NO.:  [id number]  LOCATION:  0347                         FACILITY:  [facility]  PHYSICIAN:  Susa Day, M.D.    DATE OF BIRTH:  03-Jul-1945  DATE OF PROCEDURE:  09/28/2013 DATE OF DISCHARGE:                              OPERATIVE REPORT   PREOPERATIVE DIAGNOSES:  End-stage osteoarthrosis and degenerative joint disease of the left knee.  POSTOPERATIVE DIAGNOSES:  End-stage osteoarthrosis and degenerative joint disease of the left knee.  PROCEDURE PERFORMED:  Left total knee arthroplasty.  ANESTHESIA:  General.  ASSISTANT:  Cleophas Dunker, PA.  COMPONENTS:  DePuy rotating platform, 5 femur, 5 tibia, 10-mm insert, 38 patella.  HISTORY:  A 68 year old with bone-on-bone arthrosis, failing conservative treatment, indicating for placement of degenerated joint. Risk and benefits were discussed including bleeding, infection, damage to neurovascular structures, DVT, PE, anesthetic complications, etc.  TECHNIQUE:  With the patient in supine position, after the induction of adequate general anesthesia, 2 g Kefzol, left lower extremity was prepped and draped and exsanguinated in usual sterile fashion, with thigh tourniquet inflated to 300 mmHg.  Midline incision was made over the knee.  Full-thickness flaps developed.  Median parapatellar arthrotomy performed.  Patella everted.  Knee flexed.  Soft tissues elevated medially, preserving the MCL.  Tricompartmental osteoarthritis bone on bone was noted, particularly in the medial compartment. Osteophytes removed with a rongeur.  Medial and lateral menisci remnants as well as the ACL was removed.  I performed a notch in the femur, entered in the femoral canal with a drill, parallel with the femur, irrigated it using intramedullary guide, 5 degrees left, 11 of distal femur due to flexion contracture, pinned it.  Made our distal femoral cut, then sized the femur off  the anterior cortex to a 5.  This was then pinned.  Performed anterior-posterior and chamfer cuts.  We then used a Mikhail as a sublux to the tibia.  External alignment guide used __________ the defect which was __________ tibial shaft and bisecting the tibiotalar joint.  __________ first and second rays.  This was then pinned with a slight posterior slope, performed our cut.  Soft tissues were protected at all times posteriorly.  We then tested our flexion- extension gaps.  We had some tightness in extension. We therefore revised the femur __________ the distal femur and the chamfer cuts. Then, we re-trialed.  Flexion-extension gaps were equivalent.  Following this, we then completed our tibia, maximized to a 5, distal medial aspect of the tibial tubercle.  It was then pinned which essentially drilled.  Then, used our punch guide.  We completed the femur with a box cut using a box cut jig, bisecting the canal, pinned, and performed a cut.  We used a trial femur and tibia and a 10-mm insert with full extension, full flexion, and good stability with varus valgus stressing at 0 and 30 degrees.  We then measured the patella 32, planed it to a 22- 1/2, then sized it and drilled our peg holes, medializing, and we placed a trial patellar button with excellent patellofemoral tracking.  Next, all components were removed.  We checked posteriorly, had remnants of the  menisci removed.  Geniculates cauterized.  Popliteus intact. Copiously irrigated the wound, pulsatile lavage.  Flexed the knee, used our Mikhail retractors, thoroughly dried all surfaces, mixed the cement on the back table, and injected into the tibial canal, pressurized it, impacted our 5 tibial tray, redundant cement removed.  Cemented __________ femur, redundant cement removed.  Placed a trial 10-mm insert, reduced it, and held on axial load throughout the curing of the cement, also cemented the patella as well.  After full curing of  the cement, we removed the insert, meticulously removed all redundant cement.  Copiously irrigated the wounds with antibiotic irrigation, pulsatile lavage.  We placed our 10-mm permanent insert, reduced it, had full extension, full flexion, good stability with varus valgus stressing at 0 and 30 degrees.  Excellent patellofemoral tracking.  I then used a Hemovac and brought it out through a lateral stab wound, and the skin was reapproximated, approximated the patellar arthrotomy with 1 Vicryl with the knee in slight flexion and then closed the arthrotomy with a running V-Loc.  I used Exparel in the periarticular tissues 70 mL. Subcu with 2-0 and skin with staples with flexion to 140 and had full extension.  Sterile dressing applied.  Tourniquet was deflated, and there was adequate revascularization of lower extremities appreciated.  The patient tolerated the procedure well.  No complications.  Assistant, Cleophas Dunker, [place], was used throughout the case as necessary for the patient positioning, closure, and traction.  He had negative anterior drawer as well prior to closure.  Minimal blood loss. Tourniquet time was 109 minutes.     Susa Day, M.D.     [name]  D:  09/28/2013  T:  09/29/2013  Job:  [id number]

## 2013-09-29 NOTE — Discharge Instructions (Signed)
Elevate leg above heart 6x a day for 30minutes each Use knee immobilizer while walking until can SLR x 10 Use knee immobilizer in bed to keep knee in extension May shower with aquacel dressing in place, but no soaking the dressing under water. If the dressing becomes saturated or peels off may remove prior to follow up, otherwise you may leave dressing in place until follow up. Follow up 10-14 days post-op for staple removal and xrays  Information on my medicine - XARELTO (Rivaroxaban)  This medication education was reviewed with me or my healthcare representative as part of my discharge preparation.  The pharmacist that spoke with me during my hospital stay was:  Clovis Riley, [facility]  Why was Xarelto prescribed for you? Xarelto was prescribed for you to reduce the risk of blood clots forming after orthopedic surgery. The medical term for these abnormal blood clots is venous thromboembolism (VTE).  What do you need to know about xarelto ? Take your Xarelto ONCE DAILY at the same time every day. You may take it either with or without food.  If you have difficulty swallowing the tablet whole, you may crush it and mix in applesauce just prior to taking your dose.  Take Xarelto exactly as prescribed by your doctor and DO NOT stop taking Xarelto without talking to the doctor who prescribed the medication.  Stopping without other VTE prevention medication to take the place of Xarelto may increase your risk of developing a clot.  After discharge, you should have regular check-up appointments with your healthcare provider that is prescribing your Xarelto.    What do you do if you miss a dose? If you miss a dose, take it as soon as you remember on the same day then continue your regularly scheduled once daily regimen the next day. Do not take two doses of Xarelto on the same day.   Important Safety Information A possible side effect of Xarelto is bleeding. You should call your  healthcare provider right away if you experience any of the following:   Bleeding from an injury or your nose that does not stop.   Unusual colored urine (red or dark brown) or unusual colored stools (red or black).   Unusual bruising for unknown reasons.   A serious fall or if you hit your head (even if there is no bleeding).  Some medicines may interact with Xarelto and might increase your risk of bleeding while on Xarelto. To help avoid this, consult your healthcare provider or pharmacist prior to using any new prescription or non-prescription medications, including herbals, vitamins, non-steroidal anti-inflammatory drugs (NSAIDs) and supplements.  This website has more information on Xarelto: [email or web address]

## 2013-09-29 NOTE — Progress Notes (Signed)
OT Cancellation Note  Patient Details Name: [name] MRN: [id number] DOB: [birth year]   Cancelled Treatment:    Reason Eval/Treat Not Completed: Patient declined, wants wife present for OT eval and she just left. Will reattempt later today vs. Tomorrow.  Yoshika Vensel A 09/29/2013, 11:42 AM

## 2013-09-30 LAB — CBC
HCT: 43.6 % (ref 39.0–52.0)
Hemoglobin: 14.9 g/dL (ref 13.0–17.0)
MCH: 31.3 pg (ref 26.0–34.0)
MCHC: 34.2 g/dL (ref 30.0–36.0)
MCV: 91.6 fL (ref 78.0–100.0)
PLATELETS: 181 10*3/uL (ref 150–400)
RBC: 4.76 MIL/uL (ref 4.22–5.81)
RDW: 13 % (ref 11.5–15.5)
WBC: 11.9 10*3/uL — ABNORMAL HIGH (ref 4.0–10.5)

## 2013-09-30 NOTE — Progress Notes (Addendum)
[name]  MRN: [id number] DOB/Age: [birth year] 68 y.o. Physician: Ander Slade, [name].D. 2 Days Post-Op Procedure(s) (LRB): LEFT TOTAL KNEE ARTHROPLASTY (Left)  Subjective: Rested well last night after receiving sleeper approx 1 am. Good appetite. Vital Signs Temp:  [97.9 F (36.6 C)-100 F (37.8 C)] 98.9 F (37.2 C) (08/16 0555) Pulse Rate:  [53-81] 77 (08/16 0555) Resp:  [16-20] 16 (08/16 0555) BP: (150-184)/(73-90) 160/90 mmHg (08/16 0555) SpO2:  [91 %-95 %] 93 % (08/16 0555)  Lab Results  Recent Labs  09/29/13 0535 09/30/13 0542  WBC 12.6* 11.9*  HGB 15.2 14.9  HCT 44.2 43.6  PLT 203 181   BMET  Recent Labs  09/29/13 0535  NA 137  K 4.8  CL 97  CO2 27  GLUCOSE 162*  BUN 16  CREATININE 1.10  CALCIUM 9.0   INR  Date Value Ref Range Status  09/21/2013 0.97  0.00 - 1.49 Final     Exam  Dressings dry fair quad tone, unable to SLR. N/V intact distally  Plan Mobilize w PT, dressing change today. Likely d/c Monday.  [name] 09/30/2013, 8:34 AM    Aquacel in place and intact. Will maintain

## 2013-09-30 NOTE — Progress Notes (Signed)
Physical Therapy Treatment Patient Details Name: [name] MRN: [id number] DOB: September 09, 1945 Today's Date: 10/18/2013    History of Present Illness L TKR - previous R TKR    PT Comments    Limited this pm by increased pain but continues motivated and should progress well  Follow Up Recommendations  Home health PT     Equipment Recommendations  None recommended by PT    Recommendations for Other Services       Precautions / Restrictions Precautions Precautions: Knee;Fall Required Braces or Orthoses: Knee Immobilizer - Right Knee Immobilizer - Right: Discontinue once straight leg raise with < 10 degree lag Restrictions Weight Bearing Restrictions: No Other Position/Activity Restrictions: WBAT    Mobility  Bed Mobility Overal bed mobility: Needs Assistance Bed Mobility: Sit to Supine       Sit to supine: Min assist   General bed mobility comments: cues for sequence and use of R LE to self assist  Transfers Overall transfer level: Needs assistance Equipment used: Rolling walker (2 wheeled) Transfers: Sit to/from Stand Sit to Stand: Min guard         General transfer comment: cues for LE management and use of UEs to self assist  Ambulation/Gait Ambulation/Gait assistance: Min assist;Min guard Ambulation Distance (Feet): 85 Feet (and 18 from bathroom) Assistive device: Rolling walker (2 wheeled) Gait Pattern/deviations: Step-to pattern;Shuffle;Trunk flexed         Stairs            Wheelchair Mobility    Modified Rankin (Stroke Patients Only)       Balance                                    Cognition Arousal/Alertness: Awake/alert Behavior During Therapy: WFL for tasks assessed/performed Overall Cognitive Status: Within Functional Limits for tasks assessed                      Exercises      General Comments        Pertinent Vitals/Pain Pain Assessment: 0-10 Pain Score: 6  Pain Location: L knee Pain  Descriptors / Indicators: Aching;Sore Pain Intervention(s): Limited activity within patient's tolerance;Monitored during session;Premedicated before session;Patient requesting pain meds-RN notified;Ice applied    Home Living                      Prior Function            PT Goals (current goals can now be found in the care plan section) Acute Rehab PT Goals Patient Stated Goal: Be able to bend this knee fully within 4 weeks PT Goal Formulation: With patient Time For Goal Achievement: 10/12/13 Potential to Achieve Goals: Good Progress towards PT goals: Progressing toward goals    Frequency  7X/week    PT Plan Current plan remains appropriate    Co-evaluation             End of Session   Activity Tolerance: Patient tolerated treatment well Patient left: in bed;with call bell/phone within reach;with family/visitor present     Time: 1412-1440 PT Time Calculation (min): 28 min  Charges:  $Gait Training: 8-22 mins $Therapeutic Activity: 8-22 mins                    G Codes:      Chiara Coltrin October 18, 2013, 3:53 PM

## 2013-09-30 NOTE — Progress Notes (Signed)
Occupational Therapy Evaluation Patient Details Name: [name] MRN: [id number] DOB: 07-22-1945 Today's Date: 09/30/2013    History of Present Illness L TKR - previous R TKR   Clinical Impression   Patient presents to OT with decreased ADL independence s/p L TKA. Wife will assist as needed at home and pt has no further questions after education completed. OT will sign off.    Follow Up Recommendations  No OT follow up;Supervision/Assistance - 24 hour    Equipment Recommendations  None recommended by OT    Recommendations for Other Services       Precautions / Restrictions Precautions Precautions: Knee;Fall Required Braces or Orthoses: Knee Immobilizer - Right Knee Immobilizer - Right: Discontinue once straight leg raise with < 10 degree lag Restrictions Weight Bearing Restrictions: No Other Position/Activity Restrictions: WBAT      Mobility Bed Mobility                  Transfers                      Balance                                            ADL Overall ADL's : Needs assistance/impaired Eating/Feeding: Independent;Sitting   Grooming: Set up;Sitting   Upper Body Bathing: Set up;Sitting   Lower Body Bathing: Moderate assistance;Sit to/from stand   Upper Body Dressing : Set up;Sitting   Lower Body Dressing: Moderate assistance;Sit to/from stand               Functional mobility during ADLs:  (pt declined to practice ADL transfers on eval.) General ADL Comments: Educated pt on AE for LB self-care. Patient reports his wife would A with LB self-care at discharge. Patient declined practicing toilet transfer, stating, "Well it's just like getting in the recliner, right?" Reviewed verbally toilet transfers and pt/wife verbalize understanding. Patient and wife also educated on tub transfer bench. They have a walk-in shower but it's currently "not working." Wife will look into getting it fixed vs purchasing tub  transfer bench to use in their tub/shower.     Vision                     Perception     Praxis      Pertinent Vitals/Pain Pain Assessment: No/denies pain (just had pain meds)     Hand Dominance Right   Extremity/Trunk Assessment Upper Extremity Assessment Upper Extremity Assessment: Overall WFL for tasks assessed   Lower Extremity Assessment Lower Extremity Assessment: Defer to PT evaluation       Communication Communication Communication: No difficulties   Cognition Arousal/Alertness: Awake/alert Behavior During Therapy: WFL for tasks assessed/performed Overall Cognitive Status: Within Functional Limits for tasks assessed                     General Comments       Exercises       Shoulder Instructions      Home Living Family/patient expects to be discharged to:: Private residence Living Arrangements: Spouse/significant other Available Help at Discharge: Family Type of Home: House Home Access: Stairs to enter Technical brewer of Steps: 1   Home Layout: One level     Bathroom Shower/Tub: Teacher, [employer]ah[place] [place] years/pre: Handicapped height Bathroom Accessibility: Yes How Accessible: Accessible via walker  Home Equipment: [place] - 2 wheels   Additional Comments: states he had a 3 in 1 "last time" but it never worked for him. Declines one now.      Prior Functioning/Environment Level of Independence: Independent             OT Diagnosis:     OT Problem List:     OT Treatment/Interventions:      OT Goals(Current goals can be found in the care plan section)    OT Frequency:     Barriers to D/C:            Co-evaluation              End of Session Equipment Utilized During Treatment: Other (comment) (AE) CPM Left Knee CPM Left Knee: Off  Activity Tolerance: Patient tolerated treatment well Patient left: in chair;with call bell/phone within reach;with family/visitor present   Time: 1003-1018 OT Time  Calculation (min): 15 min Charges:  OT General Charges $OT Visit: 1 Procedure OT Evaluation $Initial OT Evaluation Tier I: 1 Procedure OT Treatments $Self Care/Home Management : 8-22 mins G-Codes:    Garv Kuechle A 2013-10-07, 11:40 AM

## 2013-09-30 NOTE — Plan of Care (Signed)
Problem: Phase III Progression Outcomes Goal: Anticoagulant follow-up in place Outcome: Not Applicable Date Met:  09/30/13 xarelto     

## 2013-09-30 NOTE — Progress Notes (Signed)
Physical Therapy Treatment Patient Details Name: [name] MRN: [id number] DOB: 24-Jun-1945 Today's Date: 09/30/2013    History of Present Illness L TKR - previous R TKR    PT Comments    Progressing well - advanced to amb sans KI this am  Follow Up Recommendations  Home health PT     Equipment Recommendations  None recommended by PT    Recommendations for Other Services OT consult     Precautions / Restrictions Precautions Precautions: Knee;Fall Required Braces or Orthoses: Knee Immobilizer - Right Knee Immobilizer - Right: Discontinue once straight leg raise with < 10 degree lag Restrictions Weight Bearing Restrictions: No Other Position/Activity Restrictions: WBAT    Mobility  Bed Mobility Overal bed mobility: Needs Assistance Bed Mobility: Supine to Sit     Supine to sit: Min assist     General bed mobility comments: cues for sequence and use of R LE to self assist  Transfers Overall transfer level: Needs assistance Equipment used: Rolling walker (2 wheeled) Transfers: Sit to/from Stand Sit to Stand: Min guard         General transfer comment: cues for LE management and use of UEs to self assist  Ambulation/Gait Ambulation/Gait assistance: Min assist;Min guard Ambulation Distance (Feet): 123 Feet Assistive device: Rolling walker (2 wheeled) Gait Pattern/deviations: Step-to pattern;Shuffle;Trunk flexed     General Gait Details: cues for sequence, posture and position from RW: pt ambulating sans KI after completing IND SLR this am   Stairs            Wheelchair Mobility    Modified Rankin (Stroke Patients Only)       Balance                                    Cognition Arousal/Alertness: Awake/alert Behavior During Therapy: WFL for tasks assessed/performed Overall Cognitive Status: Within Functional Limits for tasks assessed                      Exercises Total Joint Exercises Ankle Circles/Pumps:  AROM;Both;15 reps;Supine Quad Sets: AROM;Both;Supine;20 reps Heel Slides: Left;10 reps;Supine;20 reps;AAROM Straight Leg Raises: Left;Supine;AAROM;AROM;20 reps Goniometric ROM: AAROM at L knee -10- 65    General Comments        Pertinent Vitals/Pain Pain Assessment: No/denies pain (just had pain meds) Pain Score: 4  Pain Location: L knee Pain Descriptors / Indicators: Aching;Sore Pain Intervention(s): Limited activity within patient's tolerance;Monitored during session;Premedicated before session;Ice applied    Home Living Family/patient expects to be discharged to:: Private residence Living Arrangements: Spouse/significant other Available Help at Discharge: Family Type of Home: House Home Access: Stairs to enter   Home Layout: One level Home Equipment: Environmental consultant - 2 wheels Additional Comments: states he had a 3 in 1 "last time" but it never worked for him. Declines one now.    Prior Function Level of Independence: Independent          PT Goals (current goals can now be found in the care plan section) Acute Rehab PT Goals Patient Stated Goal: Be able to bend this knee fully within 4 weeks PT Goal Formulation: With patient Time For Goal Achievement: 10/12/13 Potential to Achieve Goals: Good Progress towards PT goals: Progressing toward goals    Frequency  7X/week    PT Plan Current plan remains appropriate    Co-evaluation  End of Session Equipment Utilized During Treatment: Gait belt Activity Tolerance: Patient tolerated treatment well Patient left: in chair;with call bell/phone within reach;with family/visitor present     Time: 9562-1308 PT Time Calculation (min): 34 min  Charges:  $Gait Training: 8-22 mins $Therapeutic Exercise: 8-22 mins                    G Codes:      [name] 2013/10/12, 12:23 PM

## 2013-10-01 LAB — CBC
HCT: 40.9 % (ref 39.0–52.0)
Hemoglobin: 14.1 g/dL (ref 13.0–17.0)
MCH: 31.5 pg (ref 26.0–34.0)
MCHC: 34.5 g/dL (ref 30.0–36.0)
MCV: 91.5 fL (ref 78.0–100.0)
PLATELETS: 186 10*3/uL (ref 150–400)
RBC: 4.47 MIL/uL (ref 4.22–5.81)
RDW: 13.2 % (ref 11.5–15.5)
WBC: 10.1 10*3/uL (ref 4.0–10.5)

## 2013-10-01 NOTE — Progress Notes (Signed)
Spoke with patient this Am. Doing well. Ready to go home this aft after PT. Will place D/C orders in.

## 2013-10-01 NOTE — Progress Notes (Signed)
Physical Therapy Treatment Patient Details Name: [name] MRN: [id number] DOB: 1945-03-16 Today's Date: 10/01/2013    History of Present Illness L TKR - previous R TKR    PT Comments    Progressing steadily and plans d/c this pm  Follow Up Recommendations  Home health PT     Equipment Recommendations  None recommended by PT    Recommendations for Other Services OT consult     Precautions / Restrictions Precautions Precautions: Knee;Fall Required Braces or Orthoses: Knee Immobilizer - Right Knee Immobilizer - Right: Discontinue once straight leg raise with < 10 degree lag Restrictions Weight Bearing Restrictions: No Other Position/Activity Restrictions: WBAT    Mobility  Bed Mobility Overal bed mobility: Needs Assistance Bed Mobility: Supine to Sit     Supine to sit: Min assist     General bed mobility comments: cues for sequence and use of R LE to self assist  Transfers Overall transfer level: Needs assistance Equipment used: Rolling walker (2 wheeled) Transfers: Sit to/from Stand Sit to Stand: Min guard         General transfer comment: cues for LE management and use of UEs to self assist  Ambulation/Gait Ambulation/Gait assistance: Min guard;Supervision Ambulation Distance (Feet): 85 Feet Assistive device: Rolling walker (2 wheeled) Gait Pattern/deviations: Step-to pattern;Shuffle;Trunk flexed Gait velocity: decr   General Gait Details: cues for sequence, posture and position from RW: pt ambulating sans KI after completing IND SLR this am   Stairs Stairs: Yes Stairs assistance: Min assist Stair Management: No rails;Step to pattern;Backwards;With walker Number of Stairs: 1 (twice) General stair comments: cues for sequence and foot/RW placement  Wheelchair Mobility    Modified Rankin (Stroke Patients Only)       Balance                                    Cognition Arousal/Alertness: Awake/alert Behavior During  Therapy: WFL for tasks assessed/performed Overall Cognitive Status: Within Functional Limits for tasks assessed                      Exercises Total Joint Exercises Ankle Circles/Pumps: AROM;Both;15 reps;Supine Quad Sets: AROM;Both;Supine;20 reps Heel Slides: Left;10 reps;Supine;20 reps;AAROM Straight Leg Raises: Left;Supine;AAROM;AROM;20 reps Long Arc Quad: AAROM;AROM;Left;Seated;10 reps Goniometric ROM: AAROM at R knee -10 - 70    General Comments        Pertinent Vitals/Pain Pain Assessment: 0-10 Pain Score: 6  Pain Location: L knee Pain Descriptors / Indicators: Aching Pain Intervention(s): Limited activity within patient's tolerance;Monitored during session;Premedicated before session;RN gave pain meds during session;Ice applied    Home Living                      Prior Function            PT Goals (current goals can now be found in the care plan section) Acute Rehab PT Goals Patient Stated Goal: Be able to bend this knee fully within 4 weeks PT Goal Formulation: With patient Time For Goal Achievement: 10/12/13 Potential to Achieve Goals: Good Progress towards PT goals: Progressing toward goals    Frequency  7X/week    PT Plan Current plan remains appropriate    Co-evaluation             End of Session Equipment Utilized During Treatment: Gait belt Activity Tolerance: Patient tolerated treatment well Patient left: in chair;with call bell/phone  within reach     Time: 0858-0940 PT Time Calculation (min): 42 min  Charges:  $Gait Training: 8-22 mins $Therapeutic Exercise: 23-37 mins                    G Codes:      Vignesh Willert 10-02-13, 12:11 PM

## 2013-10-02 ENCOUNTER — Telehealth: Payer: Self-pay | Admitting: Internal Medicine

## 2013-10-02 DIAGNOSIS — R300 Dysuria: Secondary | ICD-10-CM

## 2013-10-02 NOTE — Discharge Summary (Signed)
Physician Discharge Summary   Patient ID: [name] MRN: [id number] DOB/AGE: [birth year] 68 y.o.  Admit date: 09/28/2013 Discharge date: 10/01/2013  Primary Diagnosis: DJD right knee  Admission Diagnoses: Same Past Medical History  Diagnosis Date  . Gout   . GI bleed 2005    after colonoscopy  . Hyperlipidemia   . GERD (gastroesophageal reflux disease)   . Personal history of rectal adenoma with high-grade dysplasia and colonic adenomas   . Benign fundic gland polyps of stomach   . Irregular bowel habits     occ. irregular bowel elimination  . HTN (hypertension)   . Hypothyroidism   . Arthritis   . Complication of anesthesia     states "cheap drunk- doesnt take much"   Discharge Diagnoses:   Principal Problem:   Left knee DJD  Estimated body mass index is 32.79 kg/(m^2) as calculated from the following:   Height as of this encounter: $RemoveBeforeD'5\' 11"'[id number]$  (1.803 m).   Weight as of this encounter: 106.595 kg (235 lb).  Procedure:  Procedure(s) (LRB): LEFT TOTAL KNEE ARTHROPLASTY (Left)   Consults: None  HPI: endstage djd knee right Laboratory Data: Admission on 09/28/2013, Discharged on 10/01/2013  Component Date Value Ref Range Status  . aPTT 09/28/2013 38* 24 - 37 seconds Final   Comment:                                 IF BASELINE aPTT IS ELEVATED,                          SUGGEST PATIENT RISK ASSESSMENT                          BE USED TO DETERMINE APPROPRIATE                          ANTICOAGULANT THERAPY.  . WBC 09/29/2013 12.6* 4.0 - 10.5 K/uL Final  . RBC 09/29/2013 4.85  4.22 - 5.81 MIL/uL Final  . Hemoglobin 09/29/2013 15.2  13.0 - 17.0 g/dL Final  . HCT 09/29/2013 44.2  39.0 - 52.0 % Final  . MCV 09/29/2013 91.1  78.0 - 100.0 fL Final  . MCH 09/29/2013 31.3  26.0 - 34.0 pg Final  . MCHC 09/29/2013 34.4  30.0 - 36.0 g/dL Final  . RDW 09/29/2013 13.1  11.5 - 15.5 % Final  . Platelets 09/29/2013 203  150 - 400 K/uL Final  . Sodium 09/29/2013 137  137 - 147 mEq/L  Final  . Potassium 09/29/2013 4.8  3.7 - 5.3 mEq/L Final  . Chloride 09/29/2013 97  96 - 112 mEq/L Final  . CO2 09/29/2013 27  19 - 32 mEq/L Final  . Glucose, Bld 09/29/2013 162* 70 - 99 mg/dL Final  . BUN 09/29/2013 16  6 - 23 mg/dL Final  . Creatinine, Ser 09/29/2013 1.10  0.50 - 1.35 mg/dL Final  . Calcium 09/29/2013 9.0  8.4 - 10.5 mg/dL Final  . GFR calc non Af Amer 09/29/2013 67* >90 mL/min Final  . GFR calc Af Amer 09/29/2013 78* >90 mL/min Final   Comment: (NOTE)                          The eGFR has been calculated using the CKD EPI equation.  This calculation has not been validated in all clinical situations.                          eGFR's persistently <90 mL/min signify possible Chronic Kidney                          Disease.  . Anion gap 09/29/2013 13  5 - 15 Final  . WBC 09/30/2013 11.9* 4.0 - 10.5 K/uL Final   WHITE COUNT CONFIRMED ON SMEAR  . RBC 09/30/2013 4.76  4.22 - 5.81 MIL/uL Final  . Hemoglobin 09/30/2013 14.9  13.0 - 17.0 g/dL Final  . HCT 09/30/2013 43.6  39.0 - 52.0 % Final  . MCV 09/30/2013 91.6  78.0 - 100.0 fL Final  . MCH 09/30/2013 31.3  26.0 - 34.0 pg Final  . MCHC 09/30/2013 34.2  30.0 - 36.0 g/dL Final  . RDW 09/30/2013 13.0  11.5 - 15.5 % Final  . Platelets 09/30/2013 181  150 - 400 K/uL Final  . WBC 10/01/2013 10.1  4.0 - 10.5 K/uL Final  . RBC 10/01/2013 4.47  4.22 - 5.81 MIL/uL Final  . Hemoglobin 10/01/2013 14.1  13.0 - 17.0 g/dL Final  . HCT 10/01/2013 40.9  39.0 - 52.0 % Final  . MCV 10/01/2013 91.5  78.0 - 100.0 fL Final  . MCH 10/01/2013 31.5  26.0 - 34.0 pg Final  . MCHC 10/01/2013 34.5  30.0 - 36.0 g/dL Final  . RDW 10/01/2013 13.2  11.5 - 15.5 % Final  . Platelets 10/01/2013 186  150 - 400 K/uL Final  Hospital Outpatient Visit on 09/21/2013  Component Date Value Ref Range Status  . Sodium 09/21/2013 138  137 - 147 mEq/L Final  . Potassium 09/21/2013 3.7  3.7 - 5.3 mEq/L Final  . Chloride 09/21/2013 100  96 -  112 mEq/L Final  . CO2 09/21/2013 27  19 - 32 mEq/L Final  . Glucose, Bld 09/21/2013 108* 70 - 99 mg/dL Final  . BUN 09/21/2013 17  6 - 23 mg/dL Final  . Creatinine, Ser 09/21/2013 0.90  0.50 - 1.35 mg/dL Final  . Calcium 09/21/2013 9.3  8.4 - 10.5 mg/dL Final  . GFR calc non Af Amer 09/21/2013 85* >90 mL/min Final  . GFR calc Af Amer 09/21/2013 >90  >90 mL/min Final   Comment: (NOTE)                          The eGFR has been calculated using the CKD EPI equation.                          This calculation has not been validated in all clinical situations.                          eGFR's persistently <90 mL/min signify possible Chronic Kidney                          Disease.  . Anion gap 09/21/2013 11  5 - 15 Final  . WBC 09/21/2013 5.3  4.0 - 10.5 K/uL Final  . RBC 09/21/2013 5.23  4.22 - 5.81 MIL/uL Final  . Hemoglobin 09/21/2013 16.8  13.0 - 17.0 g/dL Final  . HCT 09/21/2013 46.8  39.0 - 52.0 % Final  .  MCV 09/21/2013 89.5  78.0 - 100.0 fL Final  . MCH 09/21/2013 32.1  26.0 - 34.0 pg Final  . MCHC 09/21/2013 35.9  30.0 - 36.0 g/dL Final  . RDW 09/21/2013 13.1  11.5 - 15.5 % Final  . Platelets 09/21/2013 202  150 - 400 K/uL Final  . Prothrombin Time 09/21/2013 12.9  11.6 - 15.2 seconds Final  . INR 09/21/2013 0.97  0.00 - 1.49 Final  . ABO/RH(D) 09/21/2013 O POS   Final  . Antibody Screen 09/21/2013 NEG   Final  . Sample Expiration 09/21/2013 10/01/2013   Final  . Color, Urine 09/21/2013 YELLOW  YELLOW Final  . APPearance 09/21/2013 CLEAR  CLEAR Final  . Specific Gravity, Urine 09/21/2013 1.014  1.005 - 1.030 Final  . pH 09/21/2013 6.5  5.0 - 8.0 Final  . Glucose, UA 09/21/2013 NEGATIVE  NEGATIVE mg/dL Final  . Hgb urine dipstick 09/21/2013 NEGATIVE  NEGATIVE Final  . Bilirubin Urine 09/21/2013 NEGATIVE  NEGATIVE Final  . Ketones, ur 09/21/2013 NEGATIVE  NEGATIVE mg/dL Final  . Protein, ur 09/21/2013 NEGATIVE  NEGATIVE mg/dL Final  . Urobilinogen, UA 09/21/2013 0.2  0.0 - 1.0  mg/dL Final  . Nitrite 09/21/2013 NEGATIVE  NEGATIVE Final  . Leukocytes, UA 09/21/2013 NEGATIVE  NEGATIVE Final   MICROSCOPIC NOT DONE ON URINES WITH NEGATIVE PROTEIN, BLOOD, LEUKOCYTES, NITRITE, OR GLUCOSE <1000 mg/dL.  Marland Kitchen MRSA, PCR 09/21/2013 NEGATIVE  NEGATIVE Final  . Staphylococcus aureus 09/21/2013 NEGATIVE  NEGATIVE Final   Comment:                                 The Xpert SA Assay (FDA                          approved for NASAL specimens                          in patients over 74 years of age),                          is one component of                          a comprehensive surveillance                          program.  Test performance has                          been validated by [employer] for patients greater                          than or equal to 50 year old.                          It is not intended                          to diagnose infection nor to  guide or monitor treatment.     X-Rays:Dg Knee 1-2 Views Left  09/21/2013   CLINICAL DATA:  Left knee pain  EXAM: LEFT KNEE - 1-2 VIEW  COMPARISON:  None.  FINDINGS: Medial joint space narrowing is noted. Mild osteophytic changes are seen. No joint effusion is seen no acute fracture is noted.  IMPRESSION: Degenerative change without acute abnormality.   Electronically Signed   By: Inez Catalina M.D.   On: 09/21/2013 09:16   Dg Knee Left Port  09/28/2013   CLINICAL DATA:  68 year old male status post left knee replacement. Initial encounter.  EXAM: PORTABLE LEFT KNEE - 1-2 VIEW  COMPARISON:  Preoperative radiographs 09/21/2013.  FINDINGS: AP and cross-table lateral portable views of the left knee status post total knee arthroplasty. Postoperative drain in place. Postoperative changes to the surrounding soft tissues. Hardware appears intact and normally aligned. Stable small sclerotic focus which appears benign in the proximal fibula meta diaphysis.  IMPRESSION:  Sequelae of left total knee arthroplasty with no adverse features.   Electronically Signed   By: Lars Pinks M.D.   On: 09/28/2013 16:21    EKG: Orders placed in visit on 06/11/13  . EKG 12-LEAD     Hospital Course: [name] is a 68 y.o. who was admitted to [facility]. They were brought to the operating room on 09/28/2013 and underwent Procedure(s): LEFT TOTAL KNEE ARTHROPLASTY.  Patient tolerated the procedure well and was later transferred to the recovery room and then to the orthopaedic floor for postoperative care.  They were given PO and IV analgesics for pain control following their surgery.  They were given 24 hours of postoperative antibiotics of  Anti-infectives   Start     Dose/Rate Route Frequency Ordered Stop   09/28/13 1800  ceFAZolin (ANCEF) IVPB 2 g/50 mL premix     2 g 100 mL/hr over 30 Minutes Intravenous Every 6 hours 09/28/13 1727 09/29/13 0033   09/28/13 1412  polymyxin B 500,000 Units, bacitracin 50,000 Units in sodium chloride irrigation 0.9 % 500 mL irrigation  Status:  Discontinued       As needed 09/28/13 1412 09/28/13 1538   09/28/13 1115  ceFAZolin (ANCEF) IVPB 2 g/50 mL premix     2 g 100 mL/hr over 30 Minutes Intravenous On call to O.R. 09/28/13 1111 09/28/13 1320     and started on DVT prophylaxis in the form of Xarelto.   PT and OT were ordered for total joint protocol.  Discharge planning consulted to help with postop disposition and equipment needs.  Patient had a good night on the evening of surgery.  They started to get up OOB with therapy on day one. Hemovac drain was pulled without difficulty.  Continued to work with therapy into day two.  Dressing was changed on day two and the incision was clean.  By day three, the patient had progressed with therapy and meeting their goals.  Incision was healing well.  Patient was seen in rounds and was ready to go home.   Diet: Regular diet Activity:WBAT Follow-up:in  2 weeks Disposition -  Home Discharged Condition: good   Discharge Instructions   Call MD / Call 911    Complete by:  As directed   If you experience chest pain or shortness of breath, CALL 911 and be transported to the hospital emergency room.  If you develope a fever above 101 F, pus (white drainage) or increased drainage or redness at the wound, or calf  pain, call your surgeon's office.     Constipation Prevention    Complete by:  As directed   Drink plenty of fluids.  Prune juice may be helpful.  You may use a stool softener, such as Colace (over the counter) 100 mg twice a day.  Use MiraLax (over the counter) for constipation as needed.     Diet - low sodium heart healthy    Complete by:  As directed      Increase activity slowly as tolerated    Complete by:  As directed             Medication List    STOP taking these medications       ALEVE 220 MG Caps  Generic drug:  Naproxen Sodium     ALEVE PM 220-25 MG Tabs  Generic drug:  Naproxen Sod-Diphenhydramine     ALFALFA PO     aspirin EC 81 MG tablet     b complex vitamins tablet     fish oil-omega-3 fatty acids 1000 MG capsule     GLUCOSAMINE CHONDR COMPLEX PO     multivitamin with minerals Tabs tablet     vitamin C 500 MG tablet  Commonly known as:  ASCORBIC ACID     VITAMIN E PO     ZINC PO      TAKE these medications       AZOR 10-40 MG per tablet  Generic drug:  amLODipine-olmesartan  Take 1 tablet by mouth at bedtime.     docusate sodium 100 MG capsule  Commonly known as:  COLACE  Take 1 capsule (100 mg total) by mouth 2 (two) times daily as needed for mild constipation.     levothyroxine 50 MCG tablet  Commonly known as:  SYNTHROID, LEVOTHROID  Take 50 mcg by mouth at bedtime.     methocarbamol 500 MG tablet  Commonly known as:  ROBAXIN  Take 1 tablet (500 mg total) by mouth every 8 (eight) hours as needed for muscle spasms.     metoprolol 50 MG tablet  Commonly known as:  LOPRESSOR  Take 50 mg by mouth at  bedtime.     nitroGLYCERIN 0.4 MG SL tablet  Commonly known as:  NITROSTAT  Place 0.4 mg under the tongue every 5 (five) minutes as needed for chest pain.     omeprazole 40 MG capsule  Commonly known as:  PRILOSEC  Take 40 mg by mouth at bedtime.     oxyCODONE-acetaminophen 7.5-325 MG per tablet  Commonly known as:  PERCOCET  Take 1 tablet by mouth every 4 (four) hours as needed for pain.     PROBIOTIC DAILY PO  Take 1 tablet by mouth daily.     PROBIOTIC DAILY PO  Take by mouth.     rivaroxaban 10 MG Tabs tablet  Commonly known as:  XARELTO  Take 1 tablet (10 mg total) by mouth daily.           Follow-up Information   Follow up with Cherity Blickenstaff C, MD In 2 weeks. (For suture removal)    Specialty:  Orthopedic Surgery   Contact information:   [address] [place] [address] [phone]       Follow up with [facility])    Contact information:   [address] [address] [phone]       Signed: Arlee Muslim, PA-C Orthopaedic Surgery 10/02/2013, 6:14 AM

## 2013-10-02 NOTE — Telephone Encounter (Signed)
Pt had knee surgery on 06/28/13 and was released yesterday, pt states they informed him to get a urine specimen done because of discoloration of his urine. Pt wants to know if he can do the urine test at home and his wife can come and pick up the container and bring it back.

## 2013-10-03 ENCOUNTER — Telehealth: Payer: Self-pay | Admitting: Physician Assistant

## 2013-10-03 ENCOUNTER — Other Ambulatory Visit (INDEPENDENT_AMBULATORY_CARE_PROVIDER_SITE_OTHER): Payer: BC Managed Care – PPO

## 2013-10-03 DIAGNOSIS — R300 Dysuria: Secondary | ICD-10-CM

## 2013-10-03 LAB — POCT URINALYSIS DIPSTICK
BILIRUBIN UA: NEGATIVE
GLUCOSE UA: NEGATIVE
Ketones, UA: NEGATIVE
LEUKOCYTES UA: NEGATIVE
NITRITE UA: NEGATIVE
Spec Grav, UA: 1.01
Urobilinogen, UA: 0.2
pH, UA: 5.5

## 2013-10-03 NOTE — Telephone Encounter (Signed)
Patient Information:  Caller Name: Katharine Look  Phone: [phone]  Patient: [name], [name]  Gender: Male  DOB: 1946/01/29  Age: 68 Years  PCP: Phoebe Sharps (Adults only, leaving end of July 2015)  Office Follow Up:  Does the office need to follow up with this patient?: Yes  Instructions For The Office: Caller states patient unable to walk well due to Left Total Knee Replacement 09/28/13.  She asks if she can bring specimen to office and get Rx for him without him having to come in for evaluation.  RN Note:  [facility] [address] is requested pharmacy.  Symptoms  Reason For Call & Symptoms: Spouse reports patient had Left Total  Knee Replacement  on 09/28/13; she called orthopedics due to urine very dark.  He has urgency and burning with urination; had catheter during the surgery.  Emergent symptoms ruled out.  Go to Office Now per Urination Pain - Male guideline,  Caller states patient is unable to walk well at this time due to recent surgery; she would like to bring specimen to office and get Rx for him ASAP.  Reviewed Health History In EMR: Yes  Reviewed Medications In EMR: Yes  Reviewed Allergies In EMR: Yes  Reviewed Surgeries / Procedures: Yes  Date of Onset of Symptoms: 10/02/2013  Treatments Tried: Increased water intake  Treatments Tried Worked: Yes  Guideline(s) Used:  Urination Pain - Male  Disposition Per Guideline:   Go to Office Now  Reason For Disposition Reached:   Severe pain with urination  Advice Given:  Fluids  : Drink extra fluids (Reason: to produce a dilute, nonirritating urine).  Call Back If:  You become worse.  Patient Refused Recommendation:  Patient Refused Care Advice  Caller states patient unable to walk well due to Left Total Knee Replacement 09/28/13.  She asks if she can bring specimen to office and get Rx for him without him having to come in for evaluation.

## 2013-10-03 NOTE — Telephone Encounter (Signed)
Per the [facility] encounter below, the patient's symptoms are consistent with a possible urinary tract infection. Patient was provided a urine cup to provide a sample which was brought in today. Urine dipstick was obtained which showed only blood. Micro and Culture were obtained and are pending.   [facility] encounter: Patient Information:  Caller Name: Katharine Look  Phone: [phone]  Patient: [name], [name]  Gender: Male  DOB: 05/10/1945  Age: 68 Years  PCP: Phoebe Sharps (Adults only, leaving end of July 2015)  Office Follow Up:  Does the office need to follow up with this patient?: Yes  Instructions For The Office: Caller states patient unable to walk well due to Left Total Knee Replacement 09/28/13.  She asks if she can bring specimen to office and get Rx for him without him having to come in for evaluation.  RN Note:  [facility] [address] [facility].  Symptoms  Reason For Call & Symptoms: Spouse reports patient had Left Total  Knee Replacement  on 09/28/13; she called orthopedics due to urine very dark.  He has urgency and burning with urination; had catheter during the surgery.  Emergent symptoms ruled out.  Go to Office Now per Urination Pain - Male guideline,  Caller states patient is unable to walk well at this time due to recent surgery; she would like to bring specimen to office and get Rx for him ASAP.  Reviewed Health History In EMR: Yes  Reviewed Medications In EMR: Yes  Reviewed Allergies In EMR: Yes  Reviewed Surgeries / Procedures: Yes  Date of Onset of Symptoms: 10/02/2013  Treatments Tried: Increased water intake  Treatments Tried Worked: Yes  Guideline(s) Used:  Urination Pain - Male  Disposition Per Guideline:   Go to Office Now  Reason For Disposition Reached:   Severe pain with urination  Advice Given:  Fluids  : Drink extra fluids (Reason: to produce a dilute, nonirritating urine).  Call Back If:  You become worse.  Patient Refused  Recommendation:  Patient Refused Care Advice  Caller states patient unable to walk well due to Left Total Knee Replacement 09/28/13.  She asks if she can bring specimen to office and get Rx for him without him having to come in for evaluation.

## 2013-10-03 NOTE — Telephone Encounter (Signed)
Pt wife is aware waiting on approval. Pt wife was transferred to [facility]. Pt decline appt

## 2013-10-03 NOTE — Telephone Encounter (Signed)
Surgery done 09/28/13

## 2013-10-03 NOTE — Telephone Encounter (Signed)
Called and spoke with pt and pt is aware.  

## 2013-10-03 NOTE — Telephone Encounter (Signed)
Pt is upset that he can not just come in and leave a urine specimen.  Explain to pt that Dr Leanne Chang is not here and if he needs treatment, Dr Leanne Chang would not see it.  I advise pt that it was in his best interest to come in and be evaluated but due to his surgery he can not do that.  Told pt to call his surgeon and see if his surgeon could help him but he stated he already tried that and they told him to contact PCP.  Pt hung up on me.  Dwayne Churn, RN spoke with Dwayne Liter, PA to see if [name] would be okay treating without office visit.  Per Dwayne Liter, PA it is ok for pts wife to drop off urine specimen.  Dwayne Churn, RN called pt back and let pt know that wife could come pick up urine cup and bring it back for [place] to do a u/a.  He advised pt to establish with another provider to ensure that pt would be getting the care he needs.  Pt stated that he will establish with Dr Yong Channel.  He will call back to schedule an OV with Dr Yong Channel.  Future order placed for u/a.  Nothing further is needed

## 2013-10-03 NOTE — Addendum Note (Signed)
Addended by: Townsend Roger D on: 10/03/2013 02:02 PM   Modules accepted: Orders

## 2013-10-04 LAB — URINALYSIS, ROUTINE W REFLEX MICROSCOPIC
Bilirubin Urine: NEGATIVE
KETONES UR: NEGATIVE
Leukocytes, UA: NEGATIVE
Nitrite: NEGATIVE
Specific Gravity, Urine: 1.01 (ref 1.000–1.030)
TOTAL PROTEIN, URINE-UPE24: NEGATIVE
URINE GLUCOSE: NEGATIVE
Urobilinogen, UA: 0.2 (ref 0.0–1.0)
WBC UA: NONE SEEN (ref 0–?)
pH: 6 (ref 5.0–8.0)

## 2013-10-05 LAB — URINE CULTURE: Colony Count: >=100000

## 2013-11-05 ENCOUNTER — Other Ambulatory Visit: Payer: Self-pay | Admitting: Dermatology

## 2013-11-18 ENCOUNTER — Other Ambulatory Visit: Payer: Self-pay | Admitting: Internal Medicine

## 2013-11-19 NOTE — Telephone Encounter (Signed)
Matt, can you approve this medication?  She has not seen Dr Leanne Chang in over a year and she recently seen you for pre-op clearance.  Thanks

## 2014-03-07 ENCOUNTER — Telehealth: Payer: Self-pay | Admitting: *Deleted

## 2014-03-07 MED ORDER — AMLODIPINE-OLMESARTAN 10-40 MG PO TABS: ORAL_TABLET | ORAL | Status: DC

## 2014-03-07 MED ORDER — LEVOTHYROXINE SODIUM 50 MCG PO TABS: 50.0000 ug | ORAL_TABLET | Freq: Every day | ORAL | Status: DC

## 2014-03-07 MED ORDER — OMEPRAZOLE 40 MG PO CPDR: DELAYED_RELEASE_CAPSULE | ORAL | Status: DC

## 2014-03-07 MED ORDER — METOPROLOL TARTRATE 50 MG PO TABS: 50.0000 mg | ORAL_TABLET | Freq: Every day | ORAL | Status: DC

## 2014-03-11 MED ORDER — METOPROLOL TARTRATE 50 MG PO TABS
50.0000 mg | ORAL_TABLET | Freq: Every day | ORAL | Status: DC
Start: 2014-03-11 — End: 2014-04-25

## 2014-03-11 MED ORDER — LEVOTHYROXINE SODIUM 50 MCG PO TABS: 50.0000 ug | ORAL_TABLET | Freq: Every day | ORAL | Status: DC

## 2014-03-11 MED ORDER — OMEPRAZOLE 40 MG PO CPDR: DELAYED_RELEASE_CAPSULE | ORAL | Status: DC

## 2014-03-11 MED ORDER — AMLODIPINE-OLMESARTAN 10-40 MG PO TABS: ORAL_TABLET | ORAL | Status: DC

## 2014-03-11 NOTE — Addendum Note (Signed)
Addended by: Townsend Roger D on: 03/11/2014 02:18 PM   Modules accepted: Orders

## 2014-03-11 NOTE — Telephone Encounter (Signed)
rx resent electronically to [facility]

## 2014-03-11 NOTE — Telephone Encounter (Signed)
Pt states [facility] has no record of these refills. Can you resend? amLODipine-olmesartan (AZOR) 10-40 MG per tablet metoprolol (LOPRESSOR) 50 MG tablet omeprazole (PRILOSEC) 40 MG capsule levothyroxine (SYNTHROID, LEVOTHROID) 50 MCG tablet All 90 day  Pt has appt 3/10 w/ dr Retail banker.

## 2014-04-25 ENCOUNTER — Ambulatory Visit (INDEPENDENT_AMBULATORY_CARE_PROVIDER_SITE_OTHER): Payer: BLUE CROSS/BLUE SHIELD | Admitting: Family Medicine

## 2014-04-25 ENCOUNTER — Encounter: Payer: Self-pay | Admitting: Family Medicine

## 2014-04-25 VITALS — BP 130/64 | HR 59 | Temp 98.3°F | Wt 236.0 lb

## 2014-04-25 DIAGNOSIS — I10 Essential (primary) hypertension: Secondary | ICD-10-CM

## 2014-04-25 DIAGNOSIS — E785 Hyperlipidemia, unspecified: Secondary | ICD-10-CM

## 2014-04-25 DIAGNOSIS — E039 Hypothyroidism, unspecified: Secondary | ICD-10-CM

## 2014-04-25 DIAGNOSIS — Z23 Encounter for immunization: Secondary | ICD-10-CM

## 2014-04-25 DIAGNOSIS — M1710 Unilateral primary osteoarthritis, unspecified knee: Secondary | ICD-10-CM | POA: Insufficient documentation

## 2014-04-25 DIAGNOSIS — Z0000 Encounter for general adult medical examination without abnormal findings: Secondary | ICD-10-CM

## 2014-04-25 DIAGNOSIS — K219 Gastro-esophageal reflux disease without esophagitis: Secondary | ICD-10-CM

## 2014-04-25 DIAGNOSIS — M179 Osteoarthritis of knee, unspecified: Secondary | ICD-10-CM | POA: Insufficient documentation

## 2014-04-25 MED ORDER — AMLODIPINE-OLMESARTAN 10-40 MG PO TABS: ORAL_TABLET | ORAL | Status: DC

## 2014-04-25 MED ORDER — LEVOTHYROXINE SODIUM 50 MCG PO TABS: 50.0000 ug | ORAL_TABLET | Freq: Every day | ORAL | Status: DC

## 2014-04-25 MED ORDER — METOPROLOL SUCCINATE ER 50 MG PO TB24: 50.0000 mg | ORAL_TABLET | Freq: Every day | ORAL | Status: DC

## 2014-04-25 MED ORDER — OMEPRAZOLE 20 MG PO CPDR: DELAYED_RELEASE_CAPSULE | ORAL | Status: DC

## 2014-04-25 NOTE — Assessment & Plan Note (Signed)
Check tsh, presume well controlled on levothyroxine 79mcg

## 2014-04-25 NOTE — Progress Notes (Signed)
Dwayne Reddish, MD Phone: [phone]  Subjective:  Patient presents today to establish care with me as their new primary care provider and for annual physical. Patient was formerly a patient of Dr. Leanne Chang. Chief complaint-noted.   Patient's weight on our scales down 24 lbs from peak but states he had been even higer at home and describes 30 lbs weight loss from eating better and exercising more (improved after knee replacement). Hyperlipidemia-He has had breast lumps on lipitor and does not want to trial another statin. Blood pressure has been controlled. No hctz as it induced gout. hypoThyroid has done well on lvothyroxine low dose. He is doing well after knee replacement but needs aleve regularly to control issues-needs updated kidney funciton  Almost died in the service from the flu shot.  [employer] 68[employer]72 did not fight in [place].   ROS- no chest pain or shortness of breath. No lightheadedness. No hair or nail changes. No heat/cold intolerance. No constipation or diarrhea. Denies shakiness or anxiety. Full ROS essentially negative.   The following were reviewed and entered/updated in epic: Past Medical History  Diagnosis Date  . Gout   . GI bleed 2005    after colonoscopy  . Hyperlipidemia   . GERD (gastroesophageal reflux disease)   . Personal history of rectal adenoma with high-grade dysplasia and colonic adenomas   . Benign fundic gland polyps of stomach   . Irregular bowel habits     occ. irregular bowel elimination  . HTN (hypertension)   . Hypothyroidism   . Arthritis   . Complication of anesthesia     states "cheap drunk- doesnt take much"  . Abnormal mammogram 04/03/2008    L side- lipitor related, resolved off medication     Patient Active Problem List   Diagnosis Date Noted  . Hypothyroid 04/28/2011    Priority: Medium  . Hyperlipidemia 12/09/2006    Priority: Medium  . Essential hypertension 08/18/2006    Priority: Medium  . Knee osteoarthritis 04/25/2014   Priority: Low  . GERD (gastroesophageal reflux disease) 08/03/2012    Priority: Low  . Osteoarthritis 02/27/2010    Priority: Low  . Gout 08/18/2006    Priority: Low  . Personal history of rectal adenoma with high-grade dysplasia 08/18/2006    Priority: Low   Past Surgical History  Procedure Laterality Date  . Tumor removal  1984    from [place] from hip in jaw  . Knee arthroscopy  2011    Dr. Tonita Cong  . Total knee arthroplasty  6/11, 09/2013    Dr. Tonita Cong  . Colonoscopy    . Upper gastrointestinal endoscopy    . Joint replacement Right 09/2013    RTKA  . Knee arthroscopy with medial menisectomy Left 06/29/2013    Procedure: LEFT KNEE ARTHROSCOPY WITH DEBRIDEMENT AND A PARTIAL MEDIAL MENISECTOMY;  Surgeon: Johnn Hai, MD;  Location: [facility];  Service: Orthopedics;  Laterality: Left;  . Tonsillectomy    . Total knee arthroplasty Left 09/28/2013    Procedure: LEFT TOTAL KNEE ARTHROPLASTY;  Surgeon: Johnn Hai, MD;  Location: [facility];  Service: Orthopedics;  Laterality: Left;    Family History  Problem Relation Age of Onset  . Stroke Mother   . Diabetes Mother   . Colon cancer Neg Hx     Medications- reviewed and updated Current Outpatient Prescriptions  Medication Sig Dispense Refill  . amLODipine-olmesartan (AZOR) 10-40 MG per tablet TAKE 1 TABLET DAILY, OFFICEVISIT NEEDED 90 tablet 0  . aspirin  81 MG tablet Take 81 mg by mouth daily.    Marland Kitchen levothyroxine (SYNTHROID, LEVOTHROID) 50 MCG tablet Take 1 tablet (50 mcg total) by mouth at bedtime. 90 tablet 0  . metoprolol (LOPRESSOR) 50 MG tablet Take 1 tablet (50 mg total) by mouth at bedtime. 90 tablet 0  . omeprazole (PRILOSEC) 40 MG capsule TAKE 1 CAPSULE DAILY BEFOREBREAKFAST, OFFICE VISIT    NEEDED 90 capsule 0  . Probiotic Product (PROBIOTIC DAILY PO) Take 1 tablet by mouth daily.    . nitroGLYCERIN (NITROSTAT) 0.4 MG SL tablet Place 0.4 mg under the tongue every 5 (five) minutes as needed for  chest pain.      Allergies-reviewed and updated Allergies  Allergen Reactions  . Ace Inhibitors     Gout  . Atorvastatin     REACTION: breast tenderness  . Doxycycline     REACTION: stomach cramping. flatus    History   Social History  . Marital Status: Married    Spouse Name: N/A  . Number of Children: 2  . Years of Education: N/A   Occupational History  .     Social History Main Topics  . Smoking status: Never Smoker   . Smokeless tobacco: Never Used  . Alcohol Use: 5.0 oz/week    10 drink(s) per week     Comment: Weekends only   . Drug Use: No  . Sexual Activity: Yes   Other Topics Concern  . None   Social History Narrative   Married 44 years (wife patient of Dr. Yong Channel), 1 son attorney in [place], daughter with 3 granddaughters.       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    Considering retirement around 2018.   Will probably still work in some form.       Hobbies: garden, cooking, beach trips    ROS--See HPI   Objective: BP 130/64 mmHg  Pulse 59  Temp(Src) 98.3 F (36.8 C)  Wt 236 lb (107.049 kg) Gen: NAD, resting comfortably HEENT: Mucous membranes are moist. Oropharynx normal Neck: no thyromegaly CV: RRR no murmurs rubs or gallops Lungs: CTAB no crackles, wheeze, rhonchi Abdomen: soft/nontender/nondistended/normal bowel sounds. No rebound or guarding.  Rectal: smooth symmetric prostate without nodules, not enlarged Ext: no edema Skin: warm, dry, no rash Neuro: grossly normal, moves all extremities, PERRLA  Assessment/Plan:  69 y.o. male presenting for annual physical.  Health Maintenance counseling: 1. Anticipatory guidance: Patient counseled regarding regular dental exams, wearing seatbelts. Sees dermatology every 1-2 years.  2. Risk factor reduction:  Advised patient of need for regular exercise and diet rich and fruits and vegetables to reduce risk of heart attack and stroke.  3.  Immunizations/screenings/ancillary studies Health Maintenance Due  Topic Date Due  . ZOSTAVAX -declines for now 08/23/2005    Hyperlipidemia check lipids and calculate 10 year risk-suspect will be in benefit group. With breast lumps on lipitor, consider pravastatin or crestor.    Essential hypertension Controlled on Amlodipine 10mg -olmesartan 10mg , metoprolol 50mg  XL. Avoid HCTZ-gout hx   Hypothyroid Check tsh, presume well controlled on levothyroxine 34mcg   GERD (gastroesophageal reflux disease) Worried about PPI long term SE. Hopeful fewer symptoms with 30 lb weight loss- trial 20mg  instead of 40mg  prilosec. Can message me in 3-6 months and trial 10mg . When sees GI -could consider repeat EGD potentially.     1 year f/u.   Future fasting labs Orders Placed This Encounter  Procedures  . Pneumococcal conjugate vaccine 13-valent  .  CBC    [place]    Standing Status: Future     Number of Occurrences:      Standing Expiration Date: 04/25/2015  . Comprehensive metabolic panel    [place]    Standing Status: Future     Number of Occurrences:      Standing Expiration Date: 04/25/2015    Order Specific Question:  Has the patient fasted?    Answer:  No  . Lipid panel    [place]    Standing Status: Future     Number of Occurrences:      Standing Expiration Date: 04/25/2015    Order Specific Question:  Has the patient fasted?    Answer:  No  . Hemoglobin A1c    [place]    Standing Status: Future     Number of Occurrences:      Standing Expiration Date: 04/25/2015  . TSH    [place]    Standing Status: Future     Number of Occurrences:      Standing Expiration Date: 04/25/2015  . PSA    Standing Status: Future     Number of Occurrences:      Standing Expiration Date: 04/25/2015  . POCT urinalysis dipstick    Standing Status: Future     Number of Occurrences:      Standing Expiration Date: 04/25/2015    Meds ordered this encounter  Medications  . aspirin 81 MG tablet      Sig: Take 81 mg by mouth daily.  . metoprolol succinate (TOPROL-XL) 50 MG 24 hr tablet    Sig: Take 1 tablet (50 mg total) by mouth daily. Take with or immediately following a meal.    Dispense:  90 tablet    Refill:  3  . amLODipine-olmesartan (AZOR) 10-40 MG per tablet    Sig: TAKE 1 TABLET DAILY    Dispense:  90 tablet    Refill:  3  . levothyroxine (SYNTHROID, LEVOTHROID) 50 MCG tablet    Sig: Take 1 tablet (50 mcg total) by mouth at bedtime.    Dispense:  90 tablet    Refill:  3  . omeprazole (PRILOSEC) 20 MG capsule    Sig: TAKE 1 CAPSULE DAILY BEFOREBREAKFAST    Dispense:  90 capsule    Refill:  3

## 2014-04-25 NOTE — Assessment & Plan Note (Signed)
Worried about PPI long term SE. Hopeful fewer symptoms with 30 lb weight loss- trial 20mg  instead of 40mg  prilosec. Can message me in 3-6 months and trial 10mg . When sees GI -could consider repeat EGD potentially.

## 2014-04-25 NOTE — Assessment & Plan Note (Signed)
Controlled on Amlodipine 10mg -olmesartan 10mg , metoprolol 50mg  XL. Avoid HCTZ-gout hx

## 2014-04-25 NOTE — Patient Instructions (Addendum)
Received last Pneumonia shot today ([id number])  Will contact insurance about Zostavax/shingles.   Return for future fasting labs (schedule visit)  Refilled all medications  Trial 20mg  of omeprazole instead of 40mg , at next visit, consider reduction to 10 mg or if you do ok on 20mg , you could trial 1/2 tab in 3-6 months.   See me in a year

## 2014-04-25 NOTE — Assessment & Plan Note (Signed)
check lipids and calculate 10 year risk-suspect will be in benefit group. With breast lumps on lipitor, consider pravastatin or crestor.

## 2014-05-03 ENCOUNTER — Other Ambulatory Visit (INDEPENDENT_AMBULATORY_CARE_PROVIDER_SITE_OTHER): Payer: BLUE CROSS/BLUE SHIELD

## 2014-05-03 DIAGNOSIS — E039 Hypothyroidism, unspecified: Secondary | ICD-10-CM

## 2014-05-03 DIAGNOSIS — E785 Hyperlipidemia, unspecified: Secondary | ICD-10-CM

## 2014-05-03 DIAGNOSIS — Z125 Encounter for screening for malignant neoplasm of prostate: Secondary | ICD-10-CM

## 2014-05-03 DIAGNOSIS — Z0000 Encounter for general adult medical examination without abnormal findings: Secondary | ICD-10-CM

## 2014-05-03 LAB — LIPID PANEL
Cholesterol: 226 mg/dL — ABNORMAL HIGH (ref 0–200)
HDL: 50.1 mg/dL (ref >39.00)
LDL Cholesterol: 136 mg/dL — ABNORMAL HIGH (ref 0–99)
NonHDL: 175.9
TRIGLYCERIDES: 198 mg/dL — AB (ref 0.0–149.0)
Total CHOL/HDL Ratio: 5
VLDL: 39.6 mg/dL (ref 0.0–40.0)

## 2014-05-03 LAB — COMPREHENSIVE METABOLIC PANEL
ALT: 15 U/L (ref 0–53)
AST: 17 U/L (ref 0–37)
Albumin: 4.4 g/dL (ref 3.5–5.2)
Alkaline Phosphatase: 85 U/L (ref 39–117)
BILIRUBIN TOTAL: 0.6 mg/dL (ref 0.2–1.2)
BUN: 19 mg/dL (ref 6–23)
CO2: 31 mEq/L (ref 19–32)
CREATININE: 1.02 mg/dL (ref 0.40–1.50)
Calcium: 9.2 mg/dL (ref 8.4–10.5)
Chloride: 102 mEq/L (ref 96–112)
GFR: 77.04 mL/min (ref >60.00)
Glucose, Bld: 111 mg/dL — ABNORMAL HIGH (ref 70–99)
Potassium: 4.3 mEq/L (ref 3.5–5.1)
Sodium: 139 mEq/L (ref 135–145)
Total Protein: 6.6 g/dL (ref 6.0–8.3)

## 2014-05-03 LAB — POCT URINALYSIS DIPSTICK
Bilirubin, UA: NEGATIVE
Glucose, UA: NEGATIVE
Ketones, UA: NEGATIVE
LEUKOCYTES UA: NEGATIVE
Nitrite, UA: NEGATIVE
RBC UA: NEGATIVE
SPEC GRAV UA: 1.015
Urobilinogen, UA: 0.2
pH, UA: 7

## 2014-05-03 LAB — CBC
HCT: 51.1 % (ref 39.0–52.0)
Hemoglobin: 17.7 g/dL — ABNORMAL HIGH (ref 13.0–17.0)
MCHC: 34.7 g/dL (ref 30.0–36.0)
MCV: 89.8 fl (ref 78.0–100.0)
Platelets: 227 10*3/uL (ref 150.0–400.0)
RBC: 5.69 Mil/uL (ref 4.22–5.81)
RDW: 14.6 % (ref 11.5–15.5)
WBC: 6 10*3/uL (ref 4.0–10.5)

## 2014-05-03 LAB — HEMOGLOBIN A1C: Hgb A1c MFr Bld: 6.1 % (ref 4.6–6.5)

## 2014-05-03 LAB — PSA: PSA: 1.52 ng/mL (ref 0.10–4.00)

## 2014-05-03 LAB — TSH: TSH: 5.53 u[IU]/mL — AB (ref 0.35–4.50)

## 2014-05-04 ENCOUNTER — Other Ambulatory Visit: Payer: Self-pay | Admitting: Family Medicine

## 2014-05-04 MED ORDER — LEVOTHYROXINE SODIUM 75 MCG PO TABS: 75.0000 ug | ORAL_TABLET | Freq: Every day | ORAL | Status: DC

## 2014-09-25 ENCOUNTER — Telehealth: Payer: Self-pay | Admitting: Family Medicine

## 2014-09-25 NOTE — Telephone Encounter (Signed)
Will pt need OV for surgical clearance?

## 2014-09-25 NOTE — Telephone Encounter (Signed)
caled pt and lm to cb and schedule appt.

## 2014-09-25 NOTE — Telephone Encounter (Signed)
Yes pt will need an OV SDA slot is fine

## 2014-09-25 NOTE — Telephone Encounter (Signed)
Pt has been scheduled. Pt is having surgery 8/23 and they need clearance by mid week next week. Scheduled pt for Monday in same day

## 2014-09-25 NOTE — Telephone Encounter (Signed)
Yes- several months since I have seen patient, can use SDA if needed

## 2014-09-25 NOTE — Telephone Encounter (Signed)
Pt states  ortho is sending surgery clearance  Letter for t=right hip replacement , pt would like to know if he needs to see dr Madelyn Brunner for clearance

## 2014-09-30 ENCOUNTER — Encounter: Payer: Self-pay | Admitting: Family Medicine

## 2014-09-30 ENCOUNTER — Ambulatory Visit (INDEPENDENT_AMBULATORY_CARE_PROVIDER_SITE_OTHER): Payer: BLUE CROSS/BLUE SHIELD | Admitting: Family Medicine

## 2014-09-30 VITALS — BP 130/88 | HR 64 | Temp 98.5°F | Wt 240.0 lb

## 2014-09-30 DIAGNOSIS — E785 Hyperlipidemia, unspecified: Secondary | ICD-10-CM

## 2014-09-30 DIAGNOSIS — E039 Hypothyroidism, unspecified: Secondary | ICD-10-CM | POA: Diagnosis not present

## 2014-09-30 DIAGNOSIS — I10 Essential (primary) hypertension: Secondary | ICD-10-CM

## 2014-09-30 DIAGNOSIS — Z01818 Encounter for other preprocedural examination: Secondary | ICD-10-CM | POA: Diagnosis not present

## 2014-09-30 MED ORDER — AMLODIPINE-OLMESARTAN 10-40 MG PO TABS: ORAL_TABLET | ORAL | Status: DC

## 2014-09-30 MED ORDER — METOPROLOL SUCCINATE ER 50 MG PO TB24: 50.0000 mg | ORAL_TABLET | Freq: Every day | ORAL | Status: DC

## 2014-09-30 MED ORDER — OMEPRAZOLE 20 MG PO CPDR: DELAYED_RELEASE_CAPSULE | ORAL | Status: DC

## 2014-09-30 MED ORDER — LEVOTHYROXINE SODIUM 75 MCG PO TABS: 75.0000 ug | ORAL_TABLET | Freq: Every day | ORAL | Status: DC

## 2014-09-30 NOTE — Patient Instructions (Signed)
Medication Instructions:  No changes Discussed cholesterol medicine to lower cardiac risk but with side effects previously- we jointly opted out  Other Instructions:  Will try to have everything sent by Wednesday morning.  Agree with EKG and chest x-ray before surgery  Labwork: Thyroid test before you leave  Follow-Up (all visit scheduling, rescheduling, cancellations including labs should be scheduled at front desk): As needed related to this issues

## 2014-09-30 NOTE — Assessment & Plan Note (Addendum)
S: mild poor control in regards to total and LDL, though HDL protective.  Lab Results  Component Value Date   CHOL 226* 05/03/2014   HDL 50.10 05/03/2014   LDLCALC 136* 05/03/2014   LDLDIRECT 145.7 03/23/2013   TRIG 198.0* 05/03/2014   CHOLHDL 5 05/03/2014  A/P: discussed starting statin at least through surgery. Patient with major side effects of breast lumps on lipitor that required work up. He is not willing to take medication even short term. We discussed some increased cardiac risk due to this. Would not stop surgery for this reason though.

## 2014-09-30 NOTE — Assessment & Plan Note (Signed)
S: mild elevation last TSH. Levothyroxine increased to 11mcg Lab Results  Component Value Date   TSH 5.53* 05/03/2014  A/P: check TSH today, if TSH has normalized, will be medically maximized from surgery from this perespective

## 2014-09-30 NOTE — Progress Notes (Signed)
Pre visit review using our clinic review tool, if applicable. No additional management support is needed unless otherwise documented below in the visit note. 

## 2014-09-30 NOTE — Progress Notes (Signed)
Dwayne Reddish, MD  Subjective:  [name] is a 69 y.o. year old very pleasant male patient who presents for presurgical evaluation for right hip replacement planned 10/08/14. See problem oriented charting and summary below ROS-Patient is able to go up stairs or incline without CP or SOB, would take about 6 flights to get SOB. No nausea/vomiting/diarrhea/abnormal fatigue or diaphoresis  Past Medical History- hypothyroid, hyperlipidemia, hypertension  Medications- reviewed and updated Current Outpatient Prescriptions  Medication Sig Dispense Refill  . amLODipine-olmesartan (AZOR) 10-40 MG per tablet TAKE 1 TABLET DAILY 90 tablet 3  . aspirin 81 MG tablet Take 81 mg by mouth daily.    Marland Kitchen glucosamine-chondroitin 500-400 MG tablet Take 2 tablets by mouth daily.    Marland Kitchen levothyroxine (SYNTHROID, LEVOTHROID) 75 MCG tablet Take 1 tablet (75 mcg total) by mouth at bedtime. 90 tablet 3  . metoprolol succinate (TOPROL-XL) 50 MG 24 hr tablet Take 1 tablet (50 mg total) by mouth daily. Take with or immediately following a meal. 90 tablet 3  . Multiple Vitamin (MULTIVITAMIN WITH MINERALS) TABS tablet Take 1 tablet by mouth daily.    . naproxen sodium (ANAPROX) 220 MG tablet Take 220-440 mg by mouth 2 (two) times daily. 2 in the morning and 1 at night    . nitroGLYCERIN (NITROSTAT) 0.4 MG SL tablet Place 0.4 mg under the tongue every 5 (five) minutes as needed for chest pain.     . NON FORMULARY Take 2 capsules by mouth daily. Vitamin to help lower cholesterol    . omeprazole (PRILOSEC) 20 MG capsule TAKE 1 CAPSULE DAILY BEFOREBREAKFAST 90 capsule 3  . Probiotic Product (PROBIOTIC DAILY PO) Take 1 tablet by mouth daily.     Objective: BP 130/88 mmHg  Pulse 64  Temp(Src) 98.5 F (36.9 C) (Oral)  Wt 240 lb (108.863 kg) Gen: NAD, resting comfortably CV: RRR no murmurs rubs or gallops Lungs: CTAB no crackles, wheeze, rhonchi Abdomen: soft/nontender/nondistended/normal bowel sounds. No rebound or  guarding.  Ext: no edema, walks with severe limp due to hip pain Skin: warm, dry, no rash Neuro: grossly normal, moves all extremities, antalgic gait   Assessment/Plan:  Hypothyroid S: mild elevation last TSH. Levothyroxine increased to 63mcg Lab Results  Component Value Date   TSH 5.53* 05/03/2014  A/P: check TSH today, if TSH has normalized, will be medically maximized from surgery from this perespective  Hyperlipidemia S: mild poor control in regards to total and LDL, though HDL protective.  Lab Results  Component Value Date   CHOL 226* 05/03/2014   HDL 50.10 05/03/2014   LDLCALC 136* 05/03/2014   LDLDIRECT 145.7 03/23/2013   TRIG 198.0* 05/03/2014   CHOLHDL 5 05/03/2014  A/P: discussed starting statin at least through surgery. Patient with major side effects of breast lumps on lipitor that required work up. He is not willing to take medication even short term. We discussed some increased cardiac risk due to this. Would not stop surgery for this reason though.   Essential hypertension S: controlled on Amlodipine 10mg -olmesartan 10mg , metoprolol 50mg  XL A/P: continue current rx  Patient last had anesthesia with knee replacements in 2012, no issues with anesthesia. Several years prior he reports he was given a stress test (never catheterization). Reports normal stress test. He was never diagnosed with CAD, blockage, MI. He states he was given nitroglycerin to use if he ever had chest pain but he denies ever having chest pain or having to use nitroglycerin. It had been refilled by Dr. Leanne Chang  and now me once yearly.   Medical maximization for surgery: Thyroid and blood pressure well controlled Patient has nitro available but has no documented history of CAD and able to complete 4 mets asymptomatically Recommended statin for mild elevations cholesterol but intolerable side effects in past on statins (breat lumps) Pending CXR, EKG, other labs with anesthesia Pending normalization of  TSH and patient asymptomatic and suspect now in normal range  If TSH comes back in normal range and CXR and EKG and other labs largely normal, patient is medially maximized for surgery                      Return precautions advised.   Orders Placed This Encounter  Procedures  . TSH    [place]    Meds ordered this encounter  Medications  . amLODipine-olmesartan (AZOR) 10-40 MG per tablet    Sig: TAKE 1 TABLET DAILY    Dispense:  90 tablet    Refill:  3  . levothyroxine (SYNTHROID, LEVOTHROID) 75 MCG tablet    Sig: Take 1 tablet (75 mcg total) by mouth at bedtime.    Dispense:  90 tablet    Refill:  3  . metoprolol succinate (TOPROL-XL) 50 MG 24 hr tablet    Sig: Take 1 tablet (50 mg total) by mouth daily. Take with or immediately following a meal.    Dispense:  90 tablet    Refill:  3  . omeprazole (PRILOSEC) 20 MG capsule    Sig: TAKE 1 CAPSULE DAILY BEFOREBREAKFAST    Dispense:  90 capsule    Refill:  3

## 2014-09-30 NOTE — Assessment & Plan Note (Signed)
S: controlled on Amlodipine 10mg -olmesartan 10mg , metoprolol 50mg  XL A/P: continue current rx

## 2014-10-01 LAB — TSH: TSH: 5.6 u[IU]/mL — ABNORMAL HIGH (ref 0.35–4.50)

## 2014-10-01 MED ORDER — LEVOTHYROXINE SODIUM 88 MCG PO TABS: 88.0000 ug | ORAL_TABLET | Freq: Every day | ORAL | Status: DC

## 2014-10-01 NOTE — Addendum Note (Signed)
Addended by: Clyde Lundborg A on: 10/01/2014 02:23 PM   Modules accepted: Orders

## 2014-10-01 NOTE — Addendum Note (Signed)
Addended by: Clyde Lundborg A on: 10/01/2014 02:26 PM   Modules accepted: Orders

## 2014-10-01 NOTE — Patient Instructions (Addendum)
[name]  10/01/2014   Your procedure is scheduled on: 10/08/2014    Report to [facility] Main  Entrance take [place]  elevators to 3rd floor to  [place] at   [place] AM.  Call this number if you have problems the morning of surgery [phone]   Remember: ONLY 1 PERSON MAY GO WITH YOU TO SHORT STAY TO GET  READY MORNING OF YOUR SURGERY.  Do not eat food or drink liquids :After Midnight.     Take these medicines the morning of surgery with A SIP OF WATER:   None                                You may not have any metal on your body including hair pins and              piercings  Do not wear jewelry, , lotions, powders or perfumes, deodorant                          Men may shave face and neck.   Do not bring valuables to the hospital. [place].  Contacts, dentures or bridgework may not be worn into surgery.  Leave suitcase in the car. After surgery it may be brought to your room.         Special Instructions: coughing and deep breathing exercises, leg exercises               Please read over the following fact sheets you were given: _____________________________________________________________________             [facility] - Preparing for Surgery Before surgery, you can play an important role.  Because skin is not sterile, your skin needs to be as free of germs as possible.  You can reduce the number of germs on your skin by washing with CHG (chlorahexidine gluconate) soap before surgery.  CHG is an antiseptic cleaner which kills germs and bonds with the skin to continue killing germs even after washing. Please DO NOT use if you have an allergy to CHG or antibacterial soaps.  If your skin becomes reddened/irritated stop using the CHG and inform your nurse when you arrive at Short Stay. Do not shave (including legs and underarms) for at least 48 hours prior to the first CHG shower.  You may  shave your face/neck. Please follow these instructions carefully:  1.  Shower with CHG Soap the night before surgery and the  morning of Surgery.  2.  If you choose to wash your hair, wash your hair first as usual with your  normal  shampoo.  3.  After you shampoo, rinse your hair and body thoroughly to remove the  shampoo.                           4.  Use CHG as you would any other liquid soap.  You can apply chg directly  to the skin and wash                       Gently with a scrungie or clean washcloth.  5.  Apply  the CHG Soap to your body ONLY FROM THE NECK DOWN.   Do not use on face/ open                           Wound or open sores. Avoid contact with eyes, ears mouth and genitals (private parts).                       Wash face,  Genitals (private parts) with your normal soap.             6.  Wash thoroughly, paying special attention to the area where your surgery  will be performed.  7.  Thoroughly rinse your body with warm water from the neck down.  8.  DO NOT shower/wash with your normal soap after using and rinsing off  the CHG Soap.                9.  Pat yourself dry with a clean towel.            10.  Wear clean pajamas.            11.  Place clean sheets on your bed the night of your first shower and do not  sleep with pets. Day of Surgery : Do not apply any lotions/deodorants the morning of surgery.  Please wear clean clothes to the hospital/surgery center.  FAILURE TO FOLLOW THESE INSTRUCTIONS MAY RESULT IN THE CANCELLATION OF YOUR SURGERY PATIENT SIGNATURE_________________________________  NURSE SIGNATURE__________________________________  ________________________________________________________________________  WHAT IS A BLOOD TRANSFUSION? Blood Transfusion Information  A transfusion is the replacement of blood or some of its parts. Blood is made up of multiple cells which provide different functions.  Red blood cells carry oxygen and are used for blood loss  replacement.  White blood cells fight against infection.  Platelets control bleeding.  Plasma helps clot blood.  Other blood products are available for specialized needs, such as hemophilia or other clotting disorders. BEFORE THE TRANSFUSION  Who gives blood for transfusions?   Healthy volunteers who are fully evaluated to make sure their blood is safe. This is blood bank blood. Transfusion therapy is the safest it has ever been in the practice of medicine. Before blood is taken from a donor, a complete history is taken to make sure that person has no history of diseases nor engages in risky social behavior (examples are intravenous drug use or sexual activity with multiple partners). The donor's travel history is screened to minimize risk of transmitting infections, such as malaria. The donated blood is tested for signs of infectious diseases, such as HIV and hepatitis. The blood is then tested to be sure it is compatible with you in order to minimize the chance of a transfusion reaction. If you or a relative donates blood, this is often done in anticipation of surgery and is not appropriate for emergency situations. It takes many days to process the donated blood. RISKS AND COMPLICATIONS Although transfusion therapy is very safe and saves many lives, the main dangers of transfusion include:  1. Getting an infectious disease. 2. Developing a transfusion reaction. This is an allergic reaction to something in the blood you were given. Every precaution is taken to prevent this. The decision to have a blood transfusion has been considered carefully by your caregiver before blood is given. Blood is not given unless the benefits outweigh the risks. AFTER THE TRANSFUSION  Right after receiving a blood  transfusion, you will usually feel much better and more energetic. This is especially true if your red blood cells have gotten low (anemic). The transfusion raises the level of the red blood cells which  carry oxygen, and this usually causes an energy increase.  The nurse administering the transfusion will monitor you carefully for complications. HOME CARE INSTRUCTIONS  No special instructions are needed after a transfusion. You may find your energy is better. Speak with your caregiver about any limitations on activity for underlying diseases you may have. SEEK MEDICAL CARE IF:   Your condition is not improving after your transfusion.  You develop redness or irritation at the intravenous (IV) site. SEEK IMMEDIATE MEDICAL CARE IF:  Any of the following symptoms occur over the next 12 hours:  Shaking chills.  You have a temperature by mouth above 102 F (38.9 C), not controlled by medicine.  Chest, back, or muscle pain.  People around you feel you are not acting correctly or are confused.  Shortness of breath or difficulty breathing.  Dizziness and fainting.  You get a rash or develop hives.  You have a decrease in urine output.  Your urine turns a dark color or changes to pink, red, or brown. Any of the following symptoms occur over the next 10 days:  You have a temperature by mouth above 102 F (38.9 C), not controlled by medicine.  Shortness of breath.  Weakness after normal activity.  The white part of the eye turns yellow (jaundice).  You have a decrease in the amount of urine or are urinating less often.  Your urine turns a dark color or changes to pink, red, or brown. Document Released: 01/30/2000 Document Revised: 04/26/2011 Document Reviewed: 09/18/2007 ExitCare Patient Information 2014 [place].  _______________________________________________________________________  Incentive Spirometer  An incentive spirometer is a tool that can help keep your lungs clear and active. This tool measures how well you are filling your lungs with each breath. Taking long deep breaths may help reverse or decrease the chance of developing breathing (pulmonary) problems  (especially infection) following:  A long period of time when you are unable to move or be active. BEFORE THE PROCEDURE   If the spirometer includes an indicator to show your best effort, your nurse or respiratory therapist will set it to a desired goal.  If possible, sit up straight or lean slightly forward. Try not to slouch.  Hold the incentive spirometer in an upright position. INSTRUCTIONS FOR USE  3. Sit on the edge of your bed if possible, or sit up as far as you can in bed or on a chair. 4. Hold the incentive spirometer in an upright position. 5. Breathe out normally. 6. Place the mouthpiece in your mouth and seal your lips tightly around it. 7. Breathe in slowly and as deeply as possible, raising the piston or the ball toward the top of the column. 8. Hold your breath for 3-5 seconds or for as long as possible. Allow the piston or ball to fall to the bottom of the column. 9. Remove the mouthpiece from your mouth and breathe out normally. 10. Rest for a few seconds and repeat Steps 1 through 7 at least 10 times every 1-2 hours when you are awake. Take your time and take a few normal breaths between deep breaths. 11. The spirometer may include an indicator to show your best effort. Use the indicator as a goal to work toward during each repetition. 12. After each set of 10 deep  breaths, practice coughing to be sure your lungs are clear. If you have an incision (the cut made at the time of surgery), support your incision when coughing by placing a pillow or rolled up towels firmly against it. Once you are able to get out of bed, walk around indoors and cough well. You may stop using the incentive spirometer when instructed by your caregiver.  RISKS AND COMPLICATIONS  Take your time so you do not get dizzy or light-headed.  If you are in pain, you may need to take or ask for pain medication before doing incentive spirometry. It is harder to take a deep breath if you are having  pain. AFTER USE  Rest and breathe slowly and easily.  It can be helpful to keep track of a log of your progress. Your caregiver can provide you with a simple table to help with this. If you are using the spirometer at home, follow these instructions: [place] IF:   You are having difficultly using the spirometer.  You have trouble using the spirometer as often as instructed.  Your pain medication is not giving enough relief while using the spirometer.  You develop fever of 100.5 F (38.1 C) or higher. SEEK IMMEDIATE MEDICAL CARE IF:   You cough up bloody sputum that had not been present before.  You develop fever of 102 F (38.9 C) or greater.  You develop worsening pain at or near the incision site. MAKE SURE YOU:   Understand these instructions.  Will watch your condition.  Will get help right away if you are not doing well or get worse. Document Released: 06/14/2006 Document Revised: 04/26/2011 Document Reviewed: 08/15/2006 [facility] Patient Information 2014 [place], [place].   ________________________________________________________________________

## 2014-10-02 ENCOUNTER — Encounter (HOSPITAL_COMMUNITY): Payer: Self-pay

## 2014-10-02 ENCOUNTER — Encounter (HOSPITAL_COMMUNITY)
Admission: RE | Admit: 2014-10-02 | Discharge: 2014-10-02 | Disposition: A | Discharge status: Home / Self Care | Payer: BLUE CROSS/BLUE SHIELD | Source: Ambulatory Visit | Attending: Orthopedic Surgery | Admitting: Orthopedic Surgery

## 2014-10-02 DIAGNOSIS — Z01818 Encounter for other preprocedural examination: Secondary | ICD-10-CM | POA: Diagnosis not present

## 2014-10-02 DIAGNOSIS — M1611 Unilateral primary osteoarthritis, right hip: Secondary | ICD-10-CM | POA: Diagnosis not present

## 2014-10-02 HISTORY — DX: Malignant (primary) neoplasm, unspecified: C80.1

## 2014-10-02 LAB — URINALYSIS, ROUTINE W REFLEX MICROSCOPIC
Bilirubin Urine: NEGATIVE
GLUCOSE, UA: NEGATIVE mg/dL
Hgb urine dipstick: NEGATIVE
Ketones, ur: NEGATIVE mg/dL
NITRITE: NEGATIVE
PROTEIN: 100 mg/dL — AB
Specific Gravity, Urine: 1.028 (ref 1.005–1.030)
Urobilinogen, UA: 0.2 mg/dL (ref 0.0–1.0)
pH: 5.5 (ref 5.0–8.0)

## 2014-10-02 LAB — SURGICAL PCR SCREEN
MRSA, PCR: NEGATIVE
STAPHYLOCOCCUS AUREUS: NEGATIVE

## 2014-10-02 LAB — BASIC METABOLIC PANEL
Anion gap: 10 (ref 5–15)
BUN: 24 mg/dL — ABNORMAL HIGH (ref 6–20)
CO2: 24 mmol/L (ref 22–32)
Calcium: 9.5 mg/dL (ref 8.9–10.3)
Chloride: 103 mmol/L (ref 101–111)
Creatinine, Ser: 1.09 mg/dL (ref 0.61–1.24)
GFR calc non Af Amer: >60 mL/min (ref >60)
Glucose, Bld: 129 mg/dL — ABNORMAL HIGH (ref 65–99)
Potassium: 4 mmol/L (ref 3.5–5.1)
SODIUM: 137 mmol/L (ref 135–145)

## 2014-10-02 LAB — CBC
HCT: 51.7 % (ref 39.0–52.0)
HEMOGLOBIN: 17.9 g/dL — AB (ref 13.0–17.0)
MCH: 31.7 pg (ref 26.0–34.0)
MCHC: 34.6 g/dL (ref 30.0–36.0)
MCV: 91.7 fL (ref 78.0–100.0)
PLATELETS: 214 10*3/uL (ref 150–400)
RBC: 5.64 MIL/uL (ref 4.22–5.81)
RDW: 12.8 % (ref 11.5–15.5)
WBC: 8.1 10*3/uL (ref 4.0–10.5)

## 2014-10-02 LAB — APTT: aPTT: 41 seconds — ABNORMAL HIGH (ref 24–37)

## 2014-10-02 LAB — PROTIME-INR
INR: 1.08 (ref 0.00–1.49)
PROTHROMBIN TIME: 14.2 s (ref 11.6–15.2)

## 2014-10-02 LAB — URINE MICROSCOPIC-ADD ON

## 2014-10-02 NOTE — Progress Notes (Signed)
BMP, U/A with micro results and PTT results done 10/02/14 faxed via EPIC to Dr Alvan Dame.

## 2014-10-02 NOTE — Progress Notes (Signed)
Clearance on chart from Dr [name]- 09/30/2014 along with LOV note of 09/30/14 on chart .

## 2014-10-03 NOTE — H&P (Signed)
TOTAL HIP ADMISSION H&P  Patient is admitted for right total hip arthroplasty, anterior approach.  Subjective:  Chief Complaint:    Right hip primary OA / pain  HPI: [name], 69 y.o. male, has a history of pain and functional disability in the right hip(s) due to arthritis and patient has failed non-surgical conservative treatments for greater than 12 weeks to include NSAID's and/or analgesics, use of assistive devices and activity modification.  Onset of symptoms was abrupt starting 2-3 months  ago with rapidlly worsening course since that time.The patient noted no past surgery on the right hip(s).  Patient currently rates pain in the right hip at 10 out of 10 with activity. Patient has night pain, worsening of pain with activity and weight bearing, trendelenberg gait, pain that interfers with activities of daily living and pain with passive range of motion. Patient has evidence of periarticular osteophytes and joint space narrowing by imaging studies. This condition presents safety issues increasing the risk of falls.   There is no current active infection.   Risks, benefits and expectations were discussed with the patient.  Risks including but not limited to the risk of anesthesia, blood clots, nerve damage, blood vessel damage, failure of the prosthesis, infection and up to and including death.  Patient understand the risks, benefits and expectations and wishes to proceed with surgery.   PCP: [name], MD  D/C Plans:      Home with HHPT  Post-op Meds:       No Rx given   Tranexamic Acid:      To be given - IV  Decadron:      Is to be given  FYI:     ASA post-op  Norco post-op    Patient Active Problem List   Diagnosis Date Noted  . Knee osteoarthritis 04/25/2014  . GERD (gastroesophageal reflux disease) 08/03/2012  . Hypothyroid 04/28/2011  . Osteoarthritis 02/27/2010  . Hyperlipidemia 12/09/2006  . Gout 08/18/2006  . Essential hypertension 08/18/2006  . Personal  history of rectal adenoma with high-grade dysplasia 08/18/2006   Past Medical History  Diagnosis Date  . Gout   . GI bleed 2005    after colonoscopy  . Hyperlipidemia   . GERD (gastroesophageal reflux disease)   . Personal history of rectal adenoma with high-grade dysplasia and colonic adenomas   . Benign fundic gland polyps of stomach   . Irregular bowel habits     occ. irregular bowel elimination  . HTN (hypertension)   . Hypothyroidism   . Arthritis   . Abnormal mammogram 04/03/2008    L side- lipitor related, resolved off medication    . Cancer     jaw removal due to cancer- 40 years ago     Past Surgical History  Procedure Laterality Date  . Tumor removal  1984    from [place] from hip in jaw  . Knee arthroscopy  2011    Dr. Tonita Cong  . Total knee arthroplasty  6/11, 09/2013    Dr. Tonita Cong  . Colonoscopy    . Upper gastrointestinal endoscopy    . Knee arthroscopy with medial menisectomy Left 06/29/2013    Procedure: LEFT KNEE ARTHROSCOPY WITH DEBRIDEMENT AND A PARTIAL MEDIAL MENISECTOMY;  Surgeon: Johnn Hai, MD;  Location: [facility];  Service: Orthopedics;  Laterality: Left;  . Tonsillectomy    . Total knee arthroplasty Left 09/28/2013    Procedure: LEFT TOTAL KNEE ARTHROPLASTY;  Surgeon: Johnn Hai, MD;  Location:  [facility] OR[facility];  Service: Orthopedics;  Laterality: Left;  . Joint replacement Right 09/2013    left knee0 09/2013, right knee- 2011     No prescriptions prior to admission   Allergies  Allergen Reactions  . Ace Inhibitors     Gout  . Atorvastatin     REACTION: breast tenderness  . Doxycycline     REACTION: stomach cramping. flatus    Social History  Substance Use Topics  . Smoking status: Never Smoker   . Smokeless tobacco: Never Used  . Alcohol Use: 5.0 oz/week    10 Standard drinks or equivalent per week     Comment: occasional     Family History  Problem Relation Age of Onset  . Stroke Mother   . Diabetes Mother   . Colon  cancer Neg Hx      Review of Systems  Constitutional: Negative.   HENT: Negative.   Eyes: Negative.   Respiratory: Negative.   Cardiovascular: Negative.   Gastrointestinal: Positive for heartburn.  Genitourinary: Negative.   Musculoskeletal: Positive for joint pain.  Skin: Negative.   Neurological: Negative.   Endo/Heme/Allergies: Negative.   Psychiatric/Behavioral: Negative.     Objective:  Physical Exam  Constitutional: He is oriented to person, place, and time. He appears well-developed and well-nourished.  HENT:  Head: Normocephalic and atraumatic.  Eyes: Pupils are equal, round, and reactive to light.  Neck: Neck supple. No JVD present. No tracheal deviation present. No thyromegaly present.  Cardiovascular: Normal rate, regular rhythm, normal heart sounds and intact distal pulses.   Respiratory: Effort normal and breath sounds normal. No stridor. No respiratory distress. He has no wheezes.  GI: Soft. There is no tenderness. There is no guarding.  Musculoskeletal:       Right hip: He exhibits decreased range of motion, decreased strength, tenderness and bony tenderness. He exhibits no swelling, no deformity and no laceration.  Lymphadenopathy:    He has no cervical adenopathy.  Neurological: He is alert and oriented to person, place, and time.  Skin: Skin is warm and dry.  Psychiatric: He has a normal mood and affect.       Vital signs in last 24 hours: Temp:  [98 F (36.7 C)] 98 F (36.7 C) (08/17 1458) Pulse Rate:  [58] 58 (08/17 1458) Resp:  [16] 16 (08/17 1458) BP: (157)/(77) 157/77 mmHg (08/17 1458) SpO2:  [95 %] 95 % (08/17 1458) Weight:  [107.049 kg (236 lb)] 107.049 kg (236 lb) (08/17 1458)  Labs:  Estimated body mass index is 32.93 kg/(m^2) as calculated from the following:   Height as of 09/28/13: 5\' 11"  (1.803 m).   Weight as of 04/25/14: 107.049 kg (236 lb).   Imaging Review Plain radiographs demonstrate severe degenerative joint disease of the  right hip(s). The bone quality appears to be good for age and reported activity level.  Assessment/Plan:  End stage arthritis, right hip(s)  The patient history, physical examination, clinical judgement of the provider and imaging studies are consistent with end stage degenerative joint disease of the right hip(s) and total hip arthroplasty is deemed medically necessary. The treatment options including medical management, injection therapy, arthroscopy and arthroplasty were discussed at length. The risks and benefits of total hip arthroplasty were presented and reviewed. The risks due to aseptic loosening, infection, stiffness, dislocation/subluxation,  thromboembolic complications and other imponderables were discussed.  The patient acknowledged the explanation, agreed to proceed with the plan and consent was signed. Patient is being admitted for inpatient treatment  for surgery, pain control, PT, OT, prophylactic antibiotics, VTE prophylaxis, progressive ambulation and ADL's and discharge planning.The patient is planning to be discharged home with home health services.       [place] Sanai Frick   PA-C  10/03/2014, 1:37 PM

## 2014-10-03 NOTE — Progress Notes (Signed)
Final EKG done 8/177/16 in EPIC.

## 2014-10-08 ENCOUNTER — Encounter (HOSPITAL_COMMUNITY): Payer: Self-pay | Admitting: *Deleted

## 2014-10-08 ENCOUNTER — Inpatient Hospital Stay (HOSPITAL_COMMUNITY): Payer: BLUE CROSS/BLUE SHIELD | Admitting: Certified Registered"

## 2014-10-08 ENCOUNTER — Inpatient Hospital Stay (HOSPITAL_COMMUNITY): Payer: BLUE CROSS/BLUE SHIELD

## 2014-10-08 ENCOUNTER — Inpatient Hospital Stay (HOSPITAL_COMMUNITY)
Admission: AD | Admit: 2014-10-08 | Discharge: 2014-10-09 | DRG: 470 | Disposition: A | Discharge status: Home Health | Payer: BLUE CROSS/BLUE SHIELD | Source: Ambulatory Visit | Attending: Orthopedic Surgery | Admitting: Orthopedic Surgery

## 2014-10-08 ENCOUNTER — Encounter (HOSPITAL_COMMUNITY)
Admission: AD | Disposition: A | Discharge status: Home Health | Payer: Self-pay | Source: Ambulatory Visit | Attending: Orthopedic Surgery

## 2014-10-08 DIAGNOSIS — Z823 Family history of stroke: Secondary | ICD-10-CM | POA: Diagnosis not present

## 2014-10-08 DIAGNOSIS — Z833 Family history of diabetes mellitus: Secondary | ICD-10-CM

## 2014-10-08 DIAGNOSIS — Z96649 Presence of unspecified artificial hip joint: Secondary | ICD-10-CM

## 2014-10-08 DIAGNOSIS — K219 Gastro-esophageal reflux disease without esophagitis: Secondary | ICD-10-CM | POA: Diagnosis present

## 2014-10-08 DIAGNOSIS — Z96653 Presence of artificial knee joint, bilateral: Secondary | ICD-10-CM | POA: Diagnosis present

## 2014-10-08 DIAGNOSIS — Z01812 Encounter for preprocedural laboratory examination: Secondary | ICD-10-CM

## 2014-10-08 DIAGNOSIS — M25551 Pain in right hip: Secondary | ICD-10-CM | POA: Diagnosis present

## 2014-10-08 DIAGNOSIS — M1611 Unilateral primary osteoarthritis, right hip: Principal | ICD-10-CM | POA: Diagnosis present

## 2014-10-08 DIAGNOSIS — I10 Essential (primary) hypertension: Secondary | ICD-10-CM | POA: Diagnosis present

## 2014-10-08 DIAGNOSIS — E039 Hypothyroidism, unspecified: Secondary | ICD-10-CM | POA: Diagnosis present

## 2014-10-08 DIAGNOSIS — E785 Hyperlipidemia, unspecified: Secondary | ICD-10-CM | POA: Diagnosis present

## 2014-10-08 HISTORY — PX: TOTAL HIP ARTHROPLASTY: SHX124

## 2014-10-08 LAB — TYPE AND SCREEN
ABO/RH(D): O POS
Antibody Screen: NEGATIVE

## 2014-10-08 SURGERY — ARTHROPLASTY, HIP, TOTAL, ANTERIOR APPROACH
Anesthesia: General | Site: Hip | Laterality: Right

## 2014-10-08 MED ORDER — FENTANYL CITRATE (PF) 100 MCG/2ML IJ SOLN
INTRAMUSCULAR | Status: AC
  Administered 2014-10-08: 25 ug via INTRAVENOUS
  Filled 2014-10-08: qty 2

## 2014-10-08 MED ORDER — PROPOFOL INFUSION 10 MG/ML OPTIME: INTRAVENOUS | Status: DC | PRN

## 2014-10-08 MED ORDER — PROPOFOL 10 MG/ML IV BOLUS
INTRAVENOUS | Status: AC
  Filled 2014-10-08: qty 20

## 2014-10-08 MED ORDER — LEVOTHYROXINE SODIUM 75 MCG PO TABS
75.0000 ug | ORAL_TABLET | Freq: Every day | ORAL | Status: DC
  Administered 2014-10-09: 75 ug via ORAL
  Filled 2014-10-08 (×2): qty 1

## 2014-10-08 MED ORDER — DIPHENHYDRAMINE HCL 25 MG PO CAPS: 25.0000 mg | ORAL_CAPSULE | Freq: Four times a day (QID) | ORAL | Status: DC | PRN

## 2014-10-08 MED ORDER — DEXAMETHASONE SODIUM PHOSPHATE 10 MG/ML IJ SOLN
10.0000 mg | Freq: Once | INTRAMUSCULAR | Status: AC
  Administered 2014-10-08: 10 mg via INTRAVENOUS

## 2014-10-08 MED ORDER — HYDROMORPHONE HCL 1 MG/ML IJ SOLN
0.5000 mg | INTRAMUSCULAR | Status: DC | PRN
  Administered 2014-10-08: 1 mg via INTRAVENOUS
  Filled 2014-10-08: qty 1

## 2014-10-08 MED ORDER — LACTATED RINGERS IV SOLN
INTRAVENOUS | Status: DC | PRN
  Administered 2014-10-08 (×2): via INTRAVENOUS

## 2014-10-08 MED ORDER — ONDANSETRON HCL 4 MG/2ML IJ SOLN
INTRAMUSCULAR | Status: AC
  Filled 2014-10-08: qty 2

## 2014-10-08 MED ORDER — METOCLOPRAMIDE HCL 5 MG/ML IJ SOLN: 5.0000 mg | Freq: Three times a day (TID) | INTRAMUSCULAR | Status: DC | PRN

## 2014-10-08 MED ORDER — MIDAZOLAM HCL 2 MG/2ML IJ SOLN
INTRAMUSCULAR | Status: DC | PRN
  Administered 2014-10-08: 2 mg via INTRAVENOUS

## 2014-10-08 MED ORDER — PROPOFOL 10 MG/ML IV BOLUS
INTRAVENOUS | Status: DC | PRN
  Administered 2014-10-08: 200 mg via INTRAVENOUS
  Administered 2014-10-08: 50 mg via INTRAVENOUS

## 2014-10-08 MED ORDER — CHLORHEXIDINE GLUCONATE 4 % EX LIQD: 60.0000 mL | Freq: Once | CUTANEOUS | Status: DC

## 2014-10-08 MED ORDER — FENTANYL CITRATE (PF) 250 MCG/5ML IJ SOLN
INTRAMUSCULAR | Status: DC | PRN
  Administered 2014-10-08 (×3): 50 ug via INTRAVENOUS
  Administered 2014-10-08: 100 ug via INTRAVENOUS

## 2014-10-08 MED ORDER — POLYETHYLENE GLYCOL 3350 17 G PO PACK
17.0000 g | PACK | Freq: Two times a day (BID) | ORAL | Status: DC
  Administered 2014-10-08 – 2014-10-09 (×2): 17 g via ORAL

## 2014-10-08 MED ORDER — ALUM & MAG HYDROXIDE-SIMETH 200-200-20 MG/5ML PO SUSP: 30.0000 mL | ORAL | Status: DC | PRN

## 2014-10-08 MED ORDER — NEOSTIGMINE METHYLSULFATE 10 MG/10ML IV SOLN
INTRAVENOUS | Status: DC | PRN
  Administered 2014-10-08: 4 mg via INTRAVENOUS

## 2014-10-08 MED ORDER — AMLODIPINE-OLMESARTAN 10-40 MG PO TABS: 1.0000 | ORAL_TABLET | Freq: Every day | ORAL | Status: DC

## 2014-10-08 MED ORDER — CEFAZOLIN SODIUM-DEXTROSE 2-3 GM-% IV SOLR
2.0000 g | Freq: Four times a day (QID) | INTRAVENOUS | Status: AC
  Administered 2014-10-08 (×2): 2 g via INTRAVENOUS
  Filled 2014-10-08 (×3): qty 50

## 2014-10-08 MED ORDER — ONDANSETRON HCL 4 MG/2ML IJ SOLN
INTRAMUSCULAR | Status: DC | PRN
  Administered 2014-10-08: 4 mg via INTRAVENOUS

## 2014-10-08 MED ORDER — LIDOCAINE HCL (CARDIAC) 20 MG/ML IV SOLN
INTRAVENOUS | Status: DC | PRN
  Administered 2014-10-08: 40 mg via INTRAVENOUS

## 2014-10-08 MED ORDER — LABETALOL HCL 5 MG/ML IV SOLN
INTRAVENOUS | Status: AC
  Filled 2014-10-08: qty 4

## 2014-10-08 MED ORDER — HYDROMORPHONE HCL 1 MG/ML IJ SOLN
INTRAMUSCULAR | Status: AC
  Administered 2014-10-08: 0.25 mg via INTRAVENOUS
  Filled 2014-10-08: qty 1

## 2014-10-08 MED ORDER — BISACODYL 10 MG RE SUPP: 10.0000 mg | Freq: Every day | RECTAL | Status: DC | PRN

## 2014-10-08 MED ORDER — METHOCARBAMOL 500 MG PO TABS
500.0000 mg | ORAL_TABLET | Freq: Four times a day (QID) | ORAL | Status: DC | PRN
  Administered 2014-10-08: 500 mg via ORAL
  Filled 2014-10-08 (×2): qty 1

## 2014-10-08 MED ORDER — CEFAZOLIN SODIUM-DEXTROSE 2-3 GM-% IV SOLR
2.0000 g | INTRAVENOUS | Status: AC
  Administered 2014-10-08: 2 g via INTRAVENOUS

## 2014-10-08 MED ORDER — EPHEDRINE SULFATE 50 MG/ML IJ SOLN
INTRAMUSCULAR | Status: AC
  Filled 2014-10-08: qty 1

## 2014-10-08 MED ORDER — HYDROCODONE-ACETAMINOPHEN 7.5-325 MG PO TABS
1.0000 | ORAL_TABLET | ORAL | Status: DC
  Administered 2014-10-08 – 2014-10-09 (×5): 1 via ORAL
  Filled 2014-10-08 (×2): qty 2
  Filled 2014-10-08 (×3): qty 1

## 2014-10-08 MED ORDER — PHENYLEPHRINE 40 MCG/ML (10ML) SYRINGE FOR IV PUSH (FOR BLOOD PRESSURE SUPPORT)
PREFILLED_SYRINGE | INTRAVENOUS | Status: AC
  Filled 2014-10-08: qty 10

## 2014-10-08 MED ORDER — TRANEXAMIC ACID 1000 MG/10ML IV SOLN
1000.0000 mg | Freq: Once | INTRAVENOUS | Status: AC
  Administered 2014-10-08: 1000 mg via INTRAVENOUS
  Filled 2014-10-08: qty 10

## 2014-10-08 MED ORDER — NITROGLYCERIN 0.4 MG SL SUBL: 0.4000 mg | SUBLINGUAL_TABLET | SUBLINGUAL | Status: DC | PRN

## 2014-10-08 MED ORDER — GLYCOPYRROLATE 0.2 MG/ML IJ SOLN
INTRAMUSCULAR | Status: DC | PRN
Start: 2014-10-08 — End: 2014-10-08
  Administered 2014-10-08: .2 mg via INTRAVENOUS
  Administered 2014-10-08: 0.6 mg via INTRAVENOUS

## 2014-10-08 MED ORDER — FENTANYL CITRATE (PF) 100 MCG/2ML IJ SOLN
25.0000 ug | INTRAMUSCULAR | Status: DC | PRN
  Administered 2014-10-08 (×4): 25 ug via INTRAVENOUS

## 2014-10-08 MED ORDER — MAGNESIUM CITRATE PO SOLN: 1.0000 | Freq: Once | ORAL | Status: DC | PRN

## 2014-10-08 MED ORDER — NEOSTIGMINE METHYLSULFATE 10 MG/10ML IV SOLN
INTRAVENOUS | Status: AC
  Filled 2014-10-08: qty 1

## 2014-10-08 MED ORDER — GLYCOPYRROLATE 0.2 MG/ML IJ SOLN
INTRAMUSCULAR | Status: AC
  Filled 2014-10-08: qty 4

## 2014-10-08 MED ORDER — KETAMINE HCL 10 MG/ML IJ SOLN
INTRAMUSCULAR | Status: AC
  Filled 2014-10-08: qty 1

## 2014-10-08 MED ORDER — MEPERIDINE HCL 50 MG/ML IJ SOLN: 6.2500 mg | INTRAMUSCULAR | Status: DC | PRN

## 2014-10-08 MED ORDER — HYDROMORPHONE HCL 1 MG/ML IJ SOLN
0.2500 mg | INTRAMUSCULAR | Status: DC | PRN
  Administered 2014-10-08: 0.5 mg via INTRAVENOUS
  Administered 2014-10-08: 0.25 mg via INTRAVENOUS
  Administered 2014-10-08 (×2): 0.5 mg via INTRAVENOUS
  Administered 2014-10-08: 0.25 mg via INTRAVENOUS

## 2014-10-08 MED ORDER — MENTHOL 3 MG MT LOZG: 1.0000 | LOZENGE | OROMUCOSAL | Status: DC | PRN

## 2014-10-08 MED ORDER — SODIUM CHLORIDE 0.9 % IR SOLN
Status: DC | PRN
  Administered 2014-10-08: 1000 mL

## 2014-10-08 MED ORDER — EPHEDRINE SULFATE 50 MG/ML IJ SOLN
INTRAMUSCULAR | Status: DC | PRN
  Administered 2014-10-08 (×2): 10 mg via INTRAVENOUS

## 2014-10-08 MED ORDER — SODIUM CHLORIDE 0.9 % IV SOLN
100.0000 mL/h | INTRAVENOUS | Status: DC
  Administered 2014-10-08: 100 mL/h via INTRAVENOUS
  Filled 2014-10-08 (×4): qty 1000

## 2014-10-08 MED ORDER — ONDANSETRON HCL 4 MG/2ML IJ SOLN
4.0000 mg | Freq: Four times a day (QID) | INTRAMUSCULAR | Status: DC | PRN
  Filled 2014-10-08: qty 2

## 2014-10-08 MED ORDER — ASPIRIN EC 325 MG PO TBEC
325.0000 mg | DELAYED_RELEASE_TABLET | Freq: Two times a day (BID) | ORAL | Status: DC
  Administered 2014-10-09: 325 mg via ORAL
  Filled 2014-10-08 (×3): qty 1

## 2014-10-08 MED ORDER — PROMETHAZINE HCL 25 MG/ML IJ SOLN: 6.2500 mg | INTRAMUSCULAR | Status: DC | PRN

## 2014-10-08 MED ORDER — AMLODIPINE BESYLATE 10 MG PO TABS
10.0000 mg | ORAL_TABLET | Freq: Every day | ORAL | Status: DC
  Administered 2014-10-08 – 2014-10-09 (×2): 10 mg via ORAL
  Filled 2014-10-08 (×2): qty 1

## 2014-10-08 MED ORDER — OMEPRAZOLE 20 MG PO CPDR
20.0000 mg | DELAYED_RELEASE_CAPSULE | Freq: Every day | ORAL | Status: DC
  Administered 2014-10-09: 20 mg via ORAL
  Filled 2014-10-08 (×2): qty 1

## 2014-10-08 MED ORDER — DEXAMETHASONE SODIUM PHOSPHATE 10 MG/ML IJ SOLN
INTRAMUSCULAR | Status: AC
  Filled 2014-10-08: qty 1

## 2014-10-08 MED ORDER — HYDROMORPHONE HCL 1 MG/ML IJ SOLN
INTRAMUSCULAR | Status: AC
  Administered 2014-10-08: 0.5 mg via INTRAVENOUS
  Filled 2014-10-08: qty 1

## 2014-10-08 MED ORDER — FERROUS SULFATE 325 (65 FE) MG PO TABS
325.0000 mg | ORAL_TABLET | Freq: Three times a day (TID) | ORAL | Status: DC
  Administered 2014-10-08 – 2014-10-09 (×3): 325 mg via ORAL
  Filled 2014-10-08 (×5): qty 1

## 2014-10-08 MED ORDER — PHENOL 1.4 % MT LIQD: 1.0000 | OROMUCOSAL | Status: DC | PRN

## 2014-10-08 MED ORDER — CEFAZOLIN SODIUM-DEXTROSE 2-3 GM-% IV SOLR
INTRAVENOUS | Status: AC
  Filled 2014-10-08: qty 50

## 2014-10-08 MED ORDER — METOPROLOL SUCCINATE ER 50 MG PO TB24
50.0000 mg | ORAL_TABLET | Freq: Every day | ORAL | Status: DC
  Administered 2014-10-08 – 2014-10-09 (×2): 50 mg via ORAL
  Filled 2014-10-08 (×2): qty 1

## 2014-10-08 MED ORDER — LIDOCAINE HCL (CARDIAC) 20 MG/ML IV SOLN
INTRAVENOUS | Status: AC
  Filled 2014-10-08: qty 5

## 2014-10-08 MED ORDER — METOCLOPRAMIDE HCL 10 MG PO TABS: 5.0000 mg | ORAL_TABLET | Freq: Three times a day (TID) | ORAL | Status: DC | PRN

## 2014-10-08 MED ORDER — ROCURONIUM BROMIDE 100 MG/10ML IV SOLN
INTRAVENOUS | Status: AC
  Filled 2014-10-08: qty 2

## 2014-10-08 MED ORDER — ONDANSETRON HCL 4 MG PO TABS: 4.0000 mg | ORAL_TABLET | Freq: Four times a day (QID) | ORAL | Status: DC | PRN

## 2014-10-08 MED ORDER — MIDAZOLAM HCL 2 MG/2ML IJ SOLN
INTRAMUSCULAR | Status: AC
  Filled 2014-10-08: qty 4

## 2014-10-08 MED ORDER — SODIUM CHLORIDE 0.9 % IJ SOLN
INTRAMUSCULAR | Status: AC
  Filled 2014-10-08: qty 10

## 2014-10-08 MED ORDER — FENTANYL CITRATE (PF) 250 MCG/5ML IJ SOLN
INTRAMUSCULAR | Status: AC
Start: 2014-10-08 — End: 2014-10-08
  Filled 2014-10-08: qty 25

## 2014-10-08 MED ORDER — DEXAMETHASONE SODIUM PHOSPHATE 10 MG/ML IJ SOLN
10.0000 mg | Freq: Once | INTRAMUSCULAR | Status: AC
  Administered 2014-10-09: 10 mg via INTRAVENOUS
  Filled 2014-10-08: qty 1

## 2014-10-08 MED ORDER — IRBESARTAN 300 MG PO TABS
300.0000 mg | ORAL_TABLET | Freq: Every day | ORAL | Status: DC
  Administered 2014-10-08 – 2014-10-09 (×2): 300 mg via ORAL
  Filled 2014-10-08 (×2): qty 1

## 2014-10-08 MED ORDER — ROCURONIUM BROMIDE 100 MG/10ML IV SOLN
INTRAVENOUS | Status: AC
  Filled 2014-10-08: qty 1

## 2014-10-08 MED ORDER — CELECOXIB 200 MG PO CAPS
200.0000 mg | ORAL_CAPSULE | Freq: Two times a day (BID) | ORAL | Status: DC
  Administered 2014-10-08 – 2014-10-09 (×2): 200 mg via ORAL
  Filled 2014-10-08 (×3): qty 1

## 2014-10-08 MED ORDER — METHOCARBAMOL 1000 MG/10ML IJ SOLN
500.0000 mg | Freq: Four times a day (QID) | INTRAMUSCULAR | Status: DC | PRN
  Administered 2014-10-08: 500 mg via INTRAVENOUS
  Filled 2014-10-08 (×2): qty 5

## 2014-10-08 MED ORDER — DOCUSATE SODIUM 100 MG PO CAPS
100.0000 mg | ORAL_CAPSULE | Freq: Two times a day (BID) | ORAL | Status: DC
  Administered 2014-10-08 – 2014-10-09 (×3): 100 mg via ORAL

## 2014-10-08 MED ORDER — ROCURONIUM BROMIDE 100 MG/10ML IV SOLN
INTRAVENOUS | Status: DC | PRN
  Administered 2014-10-08 (×2): 10 mg via INTRAVENOUS
  Administered 2014-10-08: 50 mg via INTRAVENOUS
  Administered 2014-10-08: 10 mg via INTRAVENOUS

## 2014-10-08 SURGICAL SUPPLY — 43 items
BAG DECANTER FOR FLEXI CONT (MISCELLANEOUS) IMPLANT
BAG ZIPLOCK 12X15 (MISCELLANEOUS) IMPLANT
CAPT HIP TOTAL 2 ×2 IMPLANT
COVER PERINEAL POST (MISCELLANEOUS) ×2 IMPLANT
DRAPE C-ARM 42X120 X-RAY (DRAPES) ×2 IMPLANT
DRAPE STERI IOBAN 125X83 (DRAPES) ×2 IMPLANT
DRAPE U-SHAPE 47X51 STRL (DRAPES) ×6 IMPLANT
DRSG AQUACEL AG ADV 3.5X10 (GAUZE/BANDAGES/DRESSINGS) ×2 IMPLANT
DURAPREP 26ML APPLICATOR (WOUND CARE) ×2 IMPLANT
ELECT BLADE TIP CTD 4 INCH (ELECTRODE) ×2 IMPLANT
ELECT PENCIL ROCKER SW 15FT (MISCELLANEOUS) IMPLANT
ELECT REM PT RETURN 15FT ADLT (MISCELLANEOUS) IMPLANT
ELECT REM PT RETURN 9FT ADLT (ELECTROSURGICAL) ×2
ELECTRODE REM PT RTRN 9FT ADLT (ELECTROSURGICAL) ×1 IMPLANT
FACESHIELD WRAPAROUND (MASK) ×8 IMPLANT
GLOVE BIOGEL PI IND STRL 7.5 (GLOVE) ×1 IMPLANT
GLOVE BIOGEL PI IND STRL 8.5 (GLOVE) ×1 IMPLANT
GLOVE BIOGEL PI INDICATOR 7.5 (GLOVE) ×1
GLOVE BIOGEL PI INDICATOR 8.5 (GLOVE) ×1
GLOVE ECLIPSE 8.0 STRL XLNG CF (GLOVE) ×4 IMPLANT
GLOVE ORTHO TXT STRL SZ7.5 (GLOVE) ×2 IMPLANT
GOWN SPEC L3 XXLG W/TWL (GOWN DISPOSABLE) ×2 IMPLANT
GOWN STRL REUS W/TWL LRG LVL3 (GOWN DISPOSABLE) ×2 IMPLANT
HOLDER FOLEY CATH W/STRAP (MISCELLANEOUS) ×2 IMPLANT
KIT BASIN OR (CUSTOM PROCEDURE TRAY) ×2 IMPLANT
LIQUID BAND (GAUZE/BANDAGES/DRESSINGS) ×2 IMPLANT
NDL SAFETY ECLIPSE 18X1.5 (NEEDLE) IMPLANT
NEEDLE HYPO 18GX1.5 SHARP (NEEDLE)
PACK TOTAL JOINT (CUSTOM PROCEDURE TRAY) ×2 IMPLANT
PEN SKIN MARKING BROAD (MISCELLANEOUS) ×2 IMPLANT
SAW OSC TIP CART 19.5X105X1.3 (SAW) ×2 IMPLANT
SUT MNCRL AB 4-0 PS2 18 (SUTURE) ×2 IMPLANT
SUT VIC AB 1 CT1 36 (SUTURE) ×6 IMPLANT
SUT VIC AB 2-0 CT1 27 (SUTURE) ×2
SUT VIC AB 2-0 CT1 TAPERPNT 27 (SUTURE) ×2 IMPLANT
SUT VLOC 180 0 24IN GS25 (SUTURE) ×2 IMPLANT
SYR 50ML LL SCALE MARK (SYRINGE) IMPLANT
TOWEL OR 17X26 10 PK STRL BLUE (TOWEL DISPOSABLE) ×2 IMPLANT
TOWEL OR NON WOVEN STRL DISP B (DISPOSABLE) IMPLANT
TRAY FOLEY W/METER SILVER 14FR (SET/KITS/TRAYS/PACK) ×2 IMPLANT
TRAY FOLEY W/METER SILVER 16FR (SET/KITS/TRAYS/PACK) ×2 IMPLANT
WATER STERILE IRR 1500ML POUR (IV SOLUTION) ×2 IMPLANT
YANKAUER SUCT BULB TIP 10FT TU (MISCELLANEOUS) ×2 IMPLANT

## 2014-10-08 NOTE — Anesthesia Postprocedure Evaluation (Signed)
  Anesthesia Post-op Note  Patient: [name]  Procedure(s) Performed: Procedure(s) (LRB): RIGHT TOTAL HIP ARTHROPLASTY ANTERIOR APPROACH (Right)  Patient Location: PACU  Anesthesia Type: General  Level of Consciousness: awake and alert   Airway and Oxygen Therapy: Patient Spontanous Breathing  Post-op Pain: mild  Post-op Assessment: Post-op Vital signs reviewed, Patient's Cardiovascular Status Stable, Respiratory Function Stable, Patent Airway and No signs of Nausea or vomiting  Last Vitals:  Filed Vitals:   10/08/14 1319  BP: 130/73  Pulse:   Temp:   Resp:     Post-op Vital Signs: stable   Complications: No apparent anesthesia complications

## 2014-10-08 NOTE — Evaluation (Signed)
Physical Therapy Evaluation Patient Details Name: [name] MRN: [id number] DOB: Dec 07, 1945 Today's Date: 10/08/2014   History of Present Illness  R DA THA  Clinical Impression  Pt admitted with above diagnosis. Pt currently with functional limitations due to the deficits listed below (see PT Problem List).  Pt will benefit from skilled PT to increase their independence and safety with mobility to allow discharge to the venue listed below.       Follow Up Recommendations Home health PT (pt is requesting HHPT so he can return to work asap)    Equipment Recommendations  None recommended by PT    Recommendations for Other Services       Precautions / Restrictions Restrictions Other Position/Activity Restrictions: WBAT      Mobility  Bed Mobility Overal bed mobility: Needs Assistance Bed Mobility: Supine to Sit     Supine to sit: Min assist     General bed mobility comments: with RLE  Transfers Overall transfer level: Needs assistance Equipment used: Rolling walker (2 wheeled) Transfers: Sit to/from Stand           General transfer comment: cues for hand placement and RLE positioning  Ambulation/Gait Ambulation/Gait assistance: Min guard;Supervision Ambulation Distance (Feet): 80 Feet Assistive device: Rolling walker (2 wheeled) Gait Pattern/deviations: Step-to pattern;Decreased weight shift to right        Science writer    Modified Rankin (Stroke Patients Only)       Balance                                             Pertinent Vitals/Pain Pain Assessment: No/denies pain    Home Living Family/patient expects to be discharged to:: Private residence Living Arrangements: Spouse/significant other Available Help at Discharge: Family Type of Home: House Home Access: Stairs to enter   Technical brewer of Steps: 1 small step Home Layout: One level [place]: Environmental consultant - 2 wheels       Prior Function Level of Independence: Independent               Hand Dominance        Extremity/Trunk Assessment   Upper Extremity Assessment: Defer to OT evaluation;Overall WFL for tasks assessed           Lower Extremity Assessment: RLE deficits/detail RLE Deficits / Details: hip flexion 2+/5 grossly; knee extension 3/5; ankle WFL       Communication   Communication: No difficulties  Cognition Arousal/Alertness: Awake/alert Behavior During Therapy: WFL for tasks assessed/performed Overall Cognitive Status: Within Functional Limits for tasks assessed                      General Comments      Exercises Total Joint Exercises Ankle Circles/Pumps: AROM;Both;10 reps Quad Sets: 10 reps;Both;AROM      Assessment/Plan    PT Assessment Patient needs continued PT services  PT Diagnosis Difficulty walking   PT Problem List Decreased strength;Decreased range of motion;Decreased activity tolerance;Decreased mobility  PT Treatment Interventions DME instruction;Gait training;Stair training;Functional mobility training;Therapeutic activities;Patient/family education;Therapeutic exercise   PT Goals (Current goals can be found in the Care Plan section) Acute Rehab PT Goals Patient Stated Goal: get back to work PT Goal Formulation: With patient Time For Goal Achievement: 10/11/14 Potential to Achieve Goals: Good  Frequency 7X/week   Barriers to discharge        Co-evaluation               End of Session   Activity Tolerance: Patient tolerated treatment well Patient left: in chair;with call bell/phone within reach;with family/visitor present;with chair alarm set Nurse Communication: Mobility status         Time: 4174-0814 PT Time Calculation (min) (ACUTE ONLY): 34 min   Charges:   PT Evaluation $Initial PT Evaluation Tier I: 1 Procedure PT Treatments $Gait Training: 8-22 mins   PT G Codes:        [name] Oct 23, 2014, 5:01  PM

## 2014-10-08 NOTE — Progress Notes (Signed)
Utilization review completed.  

## 2014-10-08 NOTE — Anesthesia Preprocedure Evaluation (Addendum)
Anesthesia Evaluation  Patient identified by MRN, date of birth, ID band Patient awake    Reviewed: Allergy & Precautions, NPO status , Patient's Chart, lab work & pertinent test results  Airway Mallampati: II  TM Distance: >3 FB Neck ROM: Full    Dental no notable dental hx.    Pulmonary neg pulmonary ROS,  breath sounds clear to auscultation  Pulmonary exam normal       Cardiovascular hypertension, Pt. on medications Normal cardiovascular examRhythm:Regular Rate:Normal     Neuro/Psych negative neurological ROS  negative psych ROS   GI/Hepatic negative GI ROS, Neg liver ROS,   Endo/Other  negative endocrine ROS  Renal/GU negative Renal ROS  negative genitourinary   Musculoskeletal negative musculoskeletal ROS (+)   Abdominal   Peds negative pediatric ROS (+)  Hematology negative hematology ROS (+) Elevated PTT   Anesthesia Other Findings   Reproductive/Obstetrics negative OB ROS                            Anesthesia Physical Anesthesia Plan  ASA: II  Anesthesia Plan: General   Post-op Pain Management:    Induction: Intravenous  Airway Management Planned: Oral ETT  Additional Equipment:   Intra-op Plan:   Post-operative Plan: Extubation in OR  Informed Consent: I have reviewed the patients History and Physical, chart, labs and discussed the procedure including the risks, benefits and alternatives for the proposed anesthesia with the patient or authorized representative who has indicated his/her understanding and acceptance.   Dental advisory given  Plan Discussed with: CRNA  Anesthesia Plan Comments: (Elevated PTT. Not a candidate for SAB. Previous GA without issues)       Anesthesia Quick Evaluation

## 2014-10-08 NOTE — Op Note (Signed)
NAME:  [name].: [id number]      MEDICAL RECORD NO.: [id number]      FACILITY:  [facility]      PHYSICIAN:  Paralee Cancel D  DATE OF BIRTH:  Dec 20, 1945     DATE OF PROCEDURE:  10/08/2014                                 OPERATIVE REPORT         PREOPERATIVE DIAGNOSIS: Right  hip osteoarthritis.      POSTOPERATIVE DIAGNOSIS:  Right hip osteoarthritis.      PROCEDURE:  Right total hip replacement through an anterior approach   utilizing DePuy THR system, component size 38mm pinnacle cup, a size 36+4 neutral   Altrex liner, a size 8 Hi Tri Lock stem with a 36+1.5 delta ceramic   ball.      SURGEON:  Pietro Cassis. Alvan Dame, M.D.      ASSISTANT:  Danae Orleans, PA-C     ANESTHESIA:  General.      SPECIMENS:  None.      COMPLICATIONS:  None.      BLOOD LOSS:  650 cc     DRAINS:  None.      INDICATION OF THE PROCEDURE:  [name] is a 69 y.o. male who had   presented to office for evaluation of right hip pain.  Radiographs revealed   progressive degenerative changes with bone-on-bone   articulation to the  hip joint.  The patient had painful limited range of   motion significantly affecting their overall quality of life.  The patient was failing to    respond to conservative measures, and at this point was ready   to proceed with more definitive measures.  The patient has noted progressive   degenerative changes in his hip, progressive problems and dysfunction   with regarding the hip prior to surgery.  Consent was obtained for   benefit of pain relief.  Specific risk of infection, DVT, component   failure, dislocation, need for revision surgery, as well discussion of   the anterior versus posterior approach were reviewed.  Consent was   obtained for benefit of anterior pain relief through an anterior   approach.      PROCEDURE IN DETAIL:  The patient was brought to operative theater.   Once adequate anesthesia,  preoperative antibiotics, 2gm of Ancef, 1 gm of Tranexamic Acid and 10 mg of Decadron administered.   The patient was positioned supine on the OSI Hanna table.  Once adequate   padding of boney process was carried out, we had predraped out the hip, and  used fluoroscopy to confirm orientation of the pelvis and position.      The right hip was then prepped and draped from proximal iliac crest to   mid thigh with shower curtain technique.      Time-out was performed identifying the patient, planned procedure, and   extremity.     An incision was then made 2 cm distal and lateral to the   anterior superior iliac spine extending over the orientation of the   tensor fascia lata muscle and sharp dissection was carried down to the   fascia of the muscle and protractor placed in the soft tissues.      The fascia was  then incised.  The muscle belly was identified and swept   laterally and retractor placed along the superior neck.  Following   cauterization of the circumflex vessels and removing some pericapsular   fat, a second cobra retractor was placed on the inferior neck.  A third   retractor was placed on the anterior acetabulum after elevating the   anterior rectus.  A L-capsulotomy was along the line of the   superior neck to the trochanteric fossa, then extended proximally and   distally.  Tag sutures were placed and the retractors were then placed   intracapsular.  We then identified the trochanteric fossa and   orientation of my neck cut, confirmed this radiographically   and then made a neck osteotomy with the femur on traction.  The femoral   head was removed without difficulty or complication.  Traction was let   off and retractors were placed posterior and anterior around the   acetabulum.      The labrum and foveal tissue were debrided.  I began reaming with a 35mm   reamer and reamed up to 62mm reamer with good bony bed preparation and a 8mm   cup was chosen.  The final 27mm  Pinnacle cup was then impacted under fluoroscopy  to confirm the depth of penetration and orientation with respect to   abduction.  A screw was placed followed by the hole eliminator.  The final   36+4 neutral Altrex liner was impacted with good visualized rim fit.  The cup was positioned anatomically within the acetabular portion of the pelvis.      At this point, the femur was rolled at 80 degrees.  Further capsule was   released off the inferior aspect of the femoral neck.  I then   released the superior capsule proximally.  The hook was placed laterally   along the femur and elevated manually and held in position with the bed   hook.  The leg was then extended and adducted with the leg rolled to 100   degrees of external rotation.  Once the proximal femur was fully   exposed, I used a box osteotome to set orientation.  I then began   broaching with the starting chili pepper broach and passed this by hand and then broached up to 8.  With the 8 broach in place I chose a high offset neck and did a trial reduction.  The offset was appropriate, leg lengths   appeared to be equal, confirmed radiographically.   Given these findings, I went ahead and dislocated the hip, repositioned all   retractors and positioned the right hip in the extended and abducted position.  The final 8 Hi Tri Lock stem was   chosen and it was impacted down to the level of neck cut.  Based on this   and the trial reduction, a 36+1.5 delta ceramic ball was chosen and   impacted onto a clean and dry trunnion, and the hip was reduced.  The   hip had been irrigated throughout the case again at this point.  I did   reapproximate the superior capsular leaflet to the anterior leaflet   using #1 Vicryl.  The fascia of the   tensor fascia lata muscle was then reapproximated using #1 Vicryl and #0 V-lock sutures.  The   remaining wound was closed with 2-0 Vicryl and running 4-0 Monocryl.   The hip was cleaned, dried, and dressed  sterilely using Dermabond and  Aquacel dressing.  He was then brought   to recovery room in stable condition tolerating the procedure well.    Danae Orleans, PA-C was present for the entirety of the case involved from   preoperative positioning, perioperative retractor management, general   facilitation of the case, as well as primary wound closure as assistant.            Pietro Cassis Alvan Dame, M.D.        10/08/2014 9:02 AM

## 2014-10-08 NOTE — Transfer of Care (Signed)
Immediate Anesthesia Transfer of Care Note  Patient: [name]  Procedure(s) Performed: Procedure(s): RIGHT TOTAL HIP ARTHROPLASTY ANTERIOR APPROACH (Right)  Patient Location: PACU  Anesthesia Type:General  Level of Consciousness: alert , oriented and patient cooperative  Airway & Oxygen Therapy: Patient connected to face mask oxygen  Post-op Assessment: Post -op Vital signs reviewed and stable and Patient moving all extremities X 4  Post vital signs: stable  Last Vitals: There were no vitals filed for this visit.  Complications: No apparent anesthesia complications

## 2014-10-08 NOTE — Anesthesia Procedure Notes (Signed)
Procedure Name: Intubation Date/Time: 10/08/2014 7:24 AM Performed by: Pilar Grammes Pre-anesthesia Checklist: Patient identified, Emergency Drugs available, Suction available, Patient being monitored and Timeout performed Patient Re-evaluated:Patient Re-evaluated prior to inductionOxygen Delivery Method: Circle system utilized Preoxygenation: Pre-oxygenation with 100% oxygen Intubation Type: IV induction Ventilation: Mask ventilation without difficulty and Oral airway inserted - appropriate to patient size Laryngoscope Size: Mac and 4 Grade View: Grade II Tube type: Oral Tube size: 7.5 mm Number of attempts: 1 Airway Equipment and Method: Stylet Placement Confirmation: positive ETCO2,  ETT inserted through vocal cords under direct vision,  CO2 detector and breath sounds checked- equal and bilateral Secured at: 23 cm Tube secured with: Tape Dental Injury: Teeth and Oropharynx as per pre-operative assessment

## 2014-10-09 ENCOUNTER — Encounter (HOSPITAL_COMMUNITY): Payer: Self-pay | Admitting: Orthopedic Surgery

## 2014-10-09 LAB — BASIC METABOLIC PANEL
Anion gap: 5 (ref 5–15)
BUN: 25 mg/dL — AB (ref 6–20)
CALCIUM: 8.6 mg/dL — AB (ref 8.9–10.3)
CHLORIDE: 106 mmol/L (ref 101–111)
CO2: 24 mmol/L (ref 22–32)
CREATININE: 1.18 mg/dL (ref 0.61–1.24)
GFR calc non Af Amer: >60 mL/min (ref >60)
Glucose, Bld: 133 mg/dL — ABNORMAL HIGH (ref 65–99)
Potassium: 5 mmol/L (ref 3.5–5.1)
SODIUM: 135 mmol/L (ref 135–145)

## 2014-10-09 LAB — CBC
HCT: 41.5 % (ref 39.0–52.0)
HEMOGLOBIN: 13.9 g/dL (ref 13.0–17.0)
MCH: 31.3 pg (ref 26.0–34.0)
MCHC: 33.5 g/dL (ref 30.0–36.0)
MCV: 93.5 fL (ref 78.0–100.0)
PLATELETS: 178 10*3/uL (ref 150–400)
RBC: 4.44 MIL/uL (ref 4.22–5.81)
RDW: 12.8 % (ref 11.5–15.5)
WBC: 10.7 10*3/uL — ABNORMAL HIGH (ref 4.0–10.5)

## 2014-10-09 MED ORDER — POLYETHYLENE GLYCOL 3350 17 G PO PACK
17.0000 g | PACK | Freq: Two times a day (BID) | ORAL | Status: DC
Start: 2014-10-09 — End: 2015-05-01

## 2014-10-09 MED ORDER — ASPIRIN 325 MG PO TBEC: 325.0000 mg | DELAYED_RELEASE_TABLET | Freq: Two times a day (BID) | ORAL | Status: AC

## 2014-10-09 MED ORDER — FERROUS SULFATE 325 (65 FE) MG PO TABS: 325.0000 mg | ORAL_TABLET | Freq: Three times a day (TID) | ORAL | Status: DC

## 2014-10-09 MED ORDER — DOCUSATE SODIUM 100 MG PO CAPS: 100.0000 mg | ORAL_CAPSULE | Freq: Two times a day (BID) | ORAL | Status: DC

## 2014-10-09 MED ORDER — HYDROCODONE-ACETAMINOPHEN 7.5-325 MG PO TABS: 1.0000 | ORAL_TABLET | ORAL | Status: DC | PRN

## 2014-10-09 MED ORDER — TIZANIDINE HCL 4 MG PO TABS: 4.0000 mg | ORAL_TABLET | Freq: Four times a day (QID) | ORAL | Status: DC | PRN

## 2014-10-09 NOTE — Discharge Instructions (Signed)

## 2014-10-09 NOTE — Progress Notes (Signed)
Physical Therapy Treatment Patient Details Name: [name] MRN: [id number] DOB: 1945/05/21 Today's Date: 10/09/2014    History of Present Illness R DA THA    PT Comments    Pt progressing well with mobility.    Follow Up Recommendations  Home health PT     Equipment Recommendations  None recommended by PT    Recommendations for Other Services OT consult     Precautions / Restrictions Precautions Precautions: Anterior Hip Restrictions Weight Bearing Restrictions: No Other Position/Activity Restrictions: WBAT    Mobility  Bed Mobility Overal bed mobility: Needs Assistance Bed Mobility: Supine to Sit     Supine to sit: Supervision     General bed mobility comments: VC for sequencing  Transfers Overall transfer level: Needs assistance Equipment used: Rolling walker (2 wheeled) Transfers: Sit to/from Stand Sit to Stand: Min guard         General transfer comment: cues for hand placement and RLE positioning  Ambulation/Gait Ambulation/Gait assistance: Min guard;Supervision Ambulation Distance (Feet): 200 Feet Assistive device: Rolling walker (2 wheeled) Gait Pattern/deviations: Step-to pattern;Step-through pattern;Decreased step length - right;Decreased step length - left;Shuffle;Trunk flexed Gait velocity: decr   General Gait Details: cues for posture, position from RW and initial sequence.     Stairs Stairs: Yes Stairs assistance: Min assist Stair Management: No rails;Step to pattern;Forwards;With walker Number of Stairs: 2 (single step twice) General stair comments: cues for sequence and foot/RW placement  Wheelchair Mobility    Modified Rankin (Stroke Patients Only)       Balance                                    Cognition Arousal/Alertness: Awake/alert Behavior During Therapy: WFL for tasks assessed/performed Overall Cognitive Status: Within Functional Limits for tasks assessed                       Exercises Total Joint Exercises Ankle Circles/Pumps: AROM;Both;10 reps Quad Sets: 10 reps;Both;AROM Gluteal Sets: AROM;Both;10 reps;Supine Heel Slides: AAROM;Right;Supine;15 reps Hip ABduction/ADduction: AAROM;Right;15 reps;Supine    General Comments        Pertinent Vitals/Pain Pain Assessment: No/denies pain Pain Score: 4  Pain Location: R hip Pain Descriptors / Indicators: Aching;Sore Pain Intervention(s): Limited activity within patient's tolerance;Monitored during session;Ice applied;Patient requesting pain meds-RN notified    Home Living Family/patient expects to be discharged to:: Private residence Living Arrangements: Spouse/significant other Available Help at Discharge: Family Type of Home: House Home Access: Stairs to enter   Home Layout: One level Home Equipment: Environmental consultant - 2 wheels      Prior Function Level of Independence: Independent          PT Goals (current goals can now be found in the care plan section) Acute Rehab PT Goals Patient Stated Goal: get back to work PT Goal Formulation: With patient Time For Goal Achievement: 10/11/14 Potential to Achieve Goals: Good Progress towards PT goals: Progressing toward goals    Frequency  7X/week    PT Plan Current plan remains appropriate    Co-evaluation             End of Session   Activity Tolerance: Patient tolerated treatment well Patient left: in chair;with call bell/phone within reach;with family/visitor present;with chair alarm set     Time: 1001-1027 PT Time Calculation (min) (ACUTE ONLY): 26 min  Charges:  $Gait Training: 8-22 mins $Therapeutic Exercise: 8-22 mins  G Codes:      Tierrah Anastos 2014-10-25, 12:28 PM

## 2014-10-09 NOTE — Care Management Note (Signed)
Case Management Note  Patient Details  Name: [name] MRN: [id number] Date of Birth: 1945/04/06  Subjective/Objective:                   GHT TOTAL HIP ARTHROPLASTY ANTERIOR APPROACH (Right) Action/Plan:  Discharge planning  Expected Discharge Date:  10/09/14              Expected Discharge Plan:  [place]  In-House Referral:     Discharge planning Services  CM Consult  Post Acute Care Choice:  Home Health Choice offered to:  Patient  DME Arranged:    DME Agency:     HH Arranged:  PT HH Agency:  [facility]  Status of Service:  Completed, signed off  Medicare Important Message Given:    Date Medicare IM Given:    Medicare IM give by:    Date Additional Medicare IM Given:    Additional Medicare Important Message give by:     If discussed at [place] of Stay Meetings, dates discussed:    Additional Comments: CM met with pt and spouse in room to offer choice of home health agency.  Pt chooses [facility] to render HHPT.  Address and contact information verified by pt.  Referral given to [facility] rep, Tim (on unit).  Pt has rolling walker and 3n1 at home.  No other CM needs were communicated. Dellie Catholic, RN 10/09/2014, 12:34 PM

## 2014-10-09 NOTE — Progress Notes (Signed)
     Subjective: 1 Day Post-Op Procedure(s) (LRB): RIGHT TOTAL HIP ARTHROPLASTY ANTERIOR APPROACH (Right)   Patient reports pain as mild, pain controlled. No events throughout the night. Ready to be discharged home.  Objective:   VITALS:   Filed Vitals:   10/09/14 0704  BP: 142/78  Pulse: 52  Temp: 98.3 F (36.8 C)  Resp: 14    Dorsiflexion/Plantar flexion intact Incision: dressing C/D/I No cellulitis present Compartment soft  LABS  Recent Labs  10/09/14 0430  HGB 13.9  HCT 41.5  WBC 10.7*  PLT 178     Recent Labs  10/09/14 0430  NA 135  K 5.0  BUN 25*  CREATININE 1.18  GLUCOSE 133*     Assessment/Plan: 1 Day Post-Op Procedure(s) (LRB): RIGHT TOTAL HIP ARTHROPLASTY ANTERIOR APPROACH (Right) Foley cath d/c'ed Advance diet Up with therapy D/C IV fluids Discharge home with home health  Follow up in 2 weeks at [facility]. Follow up with OLIN,Parisha Beaulac D in 2 weeks.  Contact information:  [facility] [address], [address] [place]   PAC  10/09/2014, 9:18 AM

## 2014-10-09 NOTE — Progress Notes (Signed)
Physical Therapy Treatment Patient Details Name: [name] MRN: [id number] DOB: 04-21-1945 Today's Date: 10/09/2014    History of Present Illness R DA THA    PT Comments    Pt progressing well with mobility and eager for dc home.  Reviewed home therex and car transfers  Follow Up Recommendations  Home health PT     Equipment Recommendations  None recommended by PT    Recommendations for Other Services OT consult     Precautions / Restrictions Precautions Precautions: Anterior Hip Restrictions Weight Bearing Restrictions: No Other Position/Activity Restrictions: WBAT    Mobility  Bed Mobility Overal bed mobility: Needs Assistance Bed Mobility: Supine to Sit;Sit to Supine     Supine to sit: Supervision Sit to supine: Supervision   General bed mobility comments: VC for sequencing  Transfers Overall transfer level: Needs assistance Equipment used: Rolling walker (2 wheeled) Transfers: Sit to/from Stand Sit to Stand: Supervision         General transfer comment: min cues for use of UEs to self assist  Ambulation/Gait Ambulation/Gait assistance: Supervision Ambulation Distance (Feet): 240 Feet Assistive device: Rolling walker (2 wheeled) Gait Pattern/deviations: Step-to pattern;Step-through pattern;Decreased step length - right;Decreased step length - left;Shuffle;Trunk flexed Gait velocity: decr   General Gait Details: cues for posture, position from RW and initial sequence.     Stairs         General stair comments: Pt states feels comfortable with ability on stairs  Wheelchair Mobility    Modified Rankin (Stroke Patients Only)       Balance                                    Cognition Arousal/Alertness: Awake/alert Behavior During Therapy: WFL for tasks assessed/performed Overall Cognitive Status: Within Functional Limits for tasks assessed                      Exercises Total Joint Exercises Ankle  Circles/Pumps: AROM;Both;10 reps Quad Sets: 10 reps;Both;AROM Gluteal Sets: AROM;Both;10 reps;Supine Heel Slides: AAROM;Right;Supine;15 reps Hip ABduction/ADduction: AAROM;Right;15 reps;Supine Long Arc Quad: AROM;Both;15 reps;Supine Marching in Standing: AROM;5 reps;Standing;Right Standing Hip Extension: AROM;Right;10 reps;Standing    General Comments        Pertinent Vitals/Pain Pain Assessment: 0-10 Pain Score: 3  Pain Location: R hip Pain Descriptors / Indicators: Aching;Sore Pain Intervention(s): Limited activity within patient's tolerance;Monitored during session;Premedicated before session;Ice applied    Home Living Family/patient expects to be discharged to:: Private residence Living Arrangements: Spouse/significant other Available Help at Discharge: Family Type of Home: House Home Access: Stairs to enter   Home Layout: One level Home Equipment: Environmental consultant - 2 wheels      Prior Function Level of Independence: Independent          PT Goals (current goals can now be found in the care plan section) Acute Rehab PT Goals Patient Stated Goal: get back to work PT Goal Formulation: With patient Time For Goal Achievement: 10/11/14 Potential to Achieve Goals: Good Progress towards PT goals: Progressing toward goals    Frequency  7X/week    PT Plan Current plan remains appropriate    Co-evaluation             End of Session Equipment Utilized During Treatment: Gait belt Activity Tolerance: Patient tolerated treatment well Patient left: in bed;with call bell/phone within reach;with family/visitor present     Time: 0938-1829 PT Time Calculation (  min) (ACUTE ONLY): 24 min  Charges:  $Gait Training: 8-22 mins $Therapeutic Exercise: 8-22 mins                    G Codes:      Sissy Goetzke 10-13-2014, 2:45 PM

## 2014-10-09 NOTE — Evaluation (Signed)
  Occupational Therapy Evaluation Patient Details Name: [name] MRN: [id number] DOB: 1945-11-16 Today's Date: 10/26/14    History of Present Illness R DA THA   Clinical Impression   OT education complete    Follow Up Recommendations  No OT follow up          Precautions / Restrictions Precautions Precautions: Anterior Hip      Mobility Bed Mobility Overal bed mobility: Needs Assistance Bed Mobility: Supine to Sit     Supine to sit: Supervision     General bed mobility comments: VC for sequencing  Transfers Overall transfer level: Needs assistance Equipment used: Rolling walker (2 wheeled) Transfers: Sit to/from Stand Sit to Stand: Min guard         General transfer comment: cues for hand placement and RLE positioning         ADL Overall ADL's : Needs assistance/impaired                                       General ADL Comments: Pt overall S- min A with ADL activity including toieting and LB dressing. Education provided, Wife will A a needed.No DME needs     Vision            Pertinent Vitals/Pain Pain Assessment: No/denies pain     Hand Dominance     Extremity/Trunk Assessment Upper Extremity Assessment Upper Extremity Assessment: Overall WFL for tasks assessed           Communication Communication Communication: No difficulties   Cognition Arousal/Alertness: Awake/alert Behavior During Therapy: WFL for tasks assessed/performed Overall Cognitive Status: Within Functional Limits for tasks assessed                     General Comments               Home Living Family/patient expects to be discharged to:: Private residence Living Arrangements: Spouse/significant other Available Help at Discharge: Family Type of Home: House Home Access: Stairs to enter [employer] of Steps: 1 small step   Home Layout: One level     Bathroom Shower/Tub: Tub/shower unit;Walk-in shower   Bathroom  Toilet: Handicapped height     Home Equipment: Environmental consultant - 2 wheels          Prior Functioning/Environment Level of Independence: Independent                      OT Goals(Current goals can be found in the care plan section) Acute Rehab OT Goals Patient Stated Goal: get back to work  OT Frequency:     Barriers to D/C:               End of Session Nurse Communication: Mobility status  Activity Tolerance: Patient tolerated treatment well Patient left: in chair;with family/visitor present   Time: 0950-1003 OT Time Calculation (min): 13 min Charges:  OT General Charges $OT Visit: 1 Procedure OT Evaluation $Initial OT Evaluation Tier I: 1 Procedure G-Codes:    Betsy Pries 10/26/2014, 12:17 PM

## 2014-10-16 NOTE — Discharge Summary (Signed)
Physician Discharge Summary  Patient ID: [name] MRN: [id number] DOB/AGE: 1945/06/03 69 y.o.  Admit date: 10/08/2014 Discharge date: 10/09/2014   Procedures:  Procedure(s) (LRB): RIGHT TOTAL HIP ARTHROPLASTY ANTERIOR APPROACH (Right)  Attending Physician:  Dr. Paralee [name]   Admission Diagnoses:   Right hip primary OA / pain  Discharge Diagnoses:  Principal Problem:   S/P right THA, AA  Past Medical History  Diagnosis Date  . Gout   . GI bleed 2005    after colonoscopy  . Hyperlipidemia   . GERD (gastroesophageal reflux disease)   . Personal history of rectal adenoma with high-grade dysplasia and colonic adenomas   . Benign fundic gland polyps of stomach   . Irregular bowel habits     occ. irregular bowel elimination  . HTN (hypertension)   . Hypothyroidism   . Arthritis   . Abnormal mammogram 04/03/2008    L side- lipitor related, resolved off medication    . Cancer     jaw removal due to cancer- 40 years ago     HPI:    [name], 69 y.o. male, has a history of pain and functional disability in the right hip(s) due to arthritis and patient has failed non-surgical conservative treatments for greater than 12 weeks to include NSAID's and/or analgesics, use of assistive devices and activity modification. Onset of symptoms was abrupt starting 2-3 months ago with rapidlly worsening course since that time.The patient noted no past surgery on the right hip(s). Patient currently rates pain in the right hip at 10 out of 10 with activity. Patient has night pain, worsening of pain with activity and weight bearing, trendelenberg gait, pain that interfers with activities of daily living and pain with passive range of motion. Patient has evidence of periarticular osteophytes and joint space narrowing by imaging studies. This condition presents safety issues increasing the risk of falls. There is no current active infection. Risks, benefits and expectations were  discussed with the patient. Risks including but not limited to the risk of anesthesia, blood clots, nerve damage, blood vessel damage, failure of the prosthesis, infection and up to and including death. Patient understand the risks, benefits and expectations and wishes to proceed with surgery.   PCP: Dwayne Reddish, MD   Discharged Condition: good  Hospital Course:  Patient underwent the above stated procedure on 10/08/2014. Patient tolerated the procedure well and brought to the recovery room in good condition and subsequently to the floor.  POD #1 BP: 142/78 ; Pulse: 52 ; Temp: 98.3 F (36.8 C) ; Resp: 14 Patient reports pain as mild, pain controlled. No events throughout the night. Ready to be discharged home. Dorsiflexion/plantar flexion intact, incision: dressing C/D/I, no cellulitis present and compartment soft.   LABS  Basename    HGB  13.9  HCT  41.5    Discharge Exam: General appearance: alert, cooperative and no distress Extremities: Homans sign is negative, no sign of DVT, no edema, redness or tenderness in the calves or thighs and no ulcers, gangrene or trophic changes  Disposition: Home with follow up in 2 weeks   Follow-up Information    Follow up with Dwayne Pole, MD. Schedule an appointment as soon as possible for a visit in 2 weeks.   Specialty:  Orthopedic Surgery   Contact information:   [address] [place] [address] [phone]       Follow up with [facility].   Why:  home health physical therapy  Contact information:   [place] [address] [phone]       Discharge Instructions    Call MD / Call 911    Complete by:  As directed   If you experience chest pain or shortness of breath, CALL 911 and be transported to the hospital emergency room.  If you develope a fever above 101 F, pus (white drainage) or increased drainage or redness at the wound, or calf pain, call your surgeon's  office.     Change dressing    Complete by:  As directed   Maintain surgical dressing until follow up in the clinic. If the edges start to pull up, may reinforce with tape. If the dressing is no longer working, may remove and cover with gauze and tape, but must keep the area dry and clean.  Call with any questions or concerns.     Constipation Prevention    Complete by:  As directed   Drink plenty of fluids.  Prune juice may be helpful.  You may use a stool softener, such as Colace (over the counter) 100 mg twice a day.  Use MiraLax (over the counter) for constipation as needed.     Diet - low sodium heart healthy    Complete by:  As directed      Discharge instructions    Complete by:  As directed   Maintain surgical dressing until follow up in the clinic. If the edges start to pull up, may reinforce with tape. If the dressing is no longer working, may remove and cover with gauze and tape, but must keep the area dry and clean.  Follow up in 2 weeks at [facility]. Call with any questions or concerns.     Increase activity slowly as tolerated    Complete by:  As directed   Weight bearing as tolerated with assist device (walker, cane, etc) as directed, use it as long as suggested by your surgeon or therapist, typically at least 4-6 weeks.     TED hose    Complete by:  As directed   Use stockings (TED hose) for 2 weeks on both leg(s).  You may remove them at night for sleeping.             Medication List    STOP taking these medications        aspirin 81 MG tablet  Replaced by:  aspirin 325 MG EC tablet     naproxen sodium 220 MG tablet  Commonly known as:  ANAPROX      TAKE these medications        amLODipine-olmesartan 10-40 MG per tablet  Commonly known as:  AZOR  TAKE 1 TABLET DAILY     aspirin 325 MG EC tablet  Take 1 tablet (325 mg total) by mouth 2 (two) times daily.     docusate sodium 100 MG capsule  Commonly known as:  COLACE  Take 1 capsule (100 mg  total) by mouth 2 (two) times daily.     ferrous sulfate 325 (65 FE) MG tablet  Take 1 tablet (325 mg total) by mouth 3 (three) times daily after meals.     glucosamine-chondroitin 500-400 MG tablet  Take 2 tablets by mouth daily.     HYDROcodone-acetaminophen 7.5-325 MG per tablet  Commonly known as:  NORCO  Take 1-2 tablets by mouth every 4 (four) hours as needed for moderate pain.     levothyroxine 88 MCG tablet  Commonly known as:  SYNTHROID, LEVOTHROID  Take 1 tablet (88 mcg total) by mouth daily.     metoprolol succinate 50 MG 24 hr tablet  Commonly known as:  TOPROL-XL  Take 1 tablet (50 mg total) by mouth daily. Take with or immediately following a meal.     multivitamin with minerals Tabs tablet  Take 1 tablet by mouth daily.     nitroGLYCERIN 0.4 MG SL tablet  Commonly known as:  NITROSTAT  Place 0.4 mg under the tongue every 5 (five) minutes as needed for chest pain.     NON FORMULARY  Take 2 capsules by mouth daily. Vitamin to help lower cholesterol     omeprazole 20 MG capsule  Commonly known as:  PRILOSEC  TAKE 1 CAPSULE DAILY BEFOREBREAKFAST     polyethylene glycol packet  Commonly known as:  MIRALAX / GLYCOLAX  Take 17 g by mouth 2 (two) times daily.     PROBIOTIC DAILY PO  Take 1 tablet by mouth daily.     tiZANidine 4 MG tablet  Commonly known as:  ZANAFLEX  Take 1 tablet (4 mg total) by mouth every 6 (six) hours as needed for muscle spasms.         Signed: [name]. Dwayne Diltz   PA-C  10/16/2014, 10:20 AM

## 2014-11-01 ENCOUNTER — Telehealth: Payer: Self-pay | Admitting: Family Medicine

## 2014-11-01 DIAGNOSIS — E785 Hyperlipidemia, unspecified: Secondary | ICD-10-CM

## 2014-11-01 NOTE — Telephone Encounter (Signed)
May check direct LDL under hyperlipidemia

## 2014-11-01 NOTE — Telephone Encounter (Signed)
Pt has been taking vitamins  for his chole and would like to have chole blood work done.

## 2014-11-01 NOTE — Telephone Encounter (Signed)
Is this ok?

## 2014-11-04 ENCOUNTER — Other Ambulatory Visit: Payer: Self-pay | Admitting: Family Medicine

## 2014-11-04 DIAGNOSIS — E785 Hyperlipidemia, unspecified: Secondary | ICD-10-CM

## 2014-11-04 NOTE — Telephone Encounter (Signed)
Lab has been ordered.

## 2014-11-08 ENCOUNTER — Other Ambulatory Visit (INDEPENDENT_AMBULATORY_CARE_PROVIDER_SITE_OTHER): Payer: BLUE CROSS/BLUE SHIELD

## 2014-11-08 DIAGNOSIS — E785 Hyperlipidemia, unspecified: Secondary | ICD-10-CM

## 2014-11-08 DIAGNOSIS — E039 Hypothyroidism, unspecified: Secondary | ICD-10-CM

## 2014-11-08 LAB — TSH: TSH: 4.44 u[IU]/mL (ref 0.35–4.50)

## 2014-11-08 LAB — LDL CHOLESTEROL, DIRECT: Direct LDL: 149 mg/dL

## 2014-11-29 ENCOUNTER — Telehealth: Payer: Self-pay | Admitting: Family Medicine

## 2014-11-29 NOTE — Telephone Encounter (Signed)
Pt is asking for a refill on the following medicine    ACYCLOVIR   [facility]  [address] [facility]

## 2014-12-02 NOTE — Telephone Encounter (Signed)
Refill ok? 

## 2014-12-03 NOTE — Telephone Encounter (Signed)
Please schedule pt OV regarding this medicine.

## 2014-12-03 NOTE — Telephone Encounter (Signed)
Do not see on prior med list and do not see indication on problem list- needs visit

## 2014-12-04 NOTE — Telephone Encounter (Signed)
lmovm to call back and schedule an appt

## 2014-12-09 NOTE — Telephone Encounter (Signed)
lmovm to call and schedule an appt  °

## 2015-01-07 ENCOUNTER — Encounter: Payer: Self-pay | Admitting: Family Medicine

## 2015-01-07 ENCOUNTER — Ambulatory Visit (INDEPENDENT_AMBULATORY_CARE_PROVIDER_SITE_OTHER): Payer: BLUE CROSS/BLUE SHIELD | Admitting: Family Medicine

## 2015-01-07 VITALS — BP 130/80 | HR 69 | Temp 98.5°F | Wt 244.0 lb

## 2015-01-07 DIAGNOSIS — N5201 Erectile dysfunction due to arterial insufficiency: Secondary | ICD-10-CM | POA: Diagnosis not present

## 2015-01-07 DIAGNOSIS — N529 Male erectile dysfunction, unspecified: Secondary | ICD-10-CM | POA: Insufficient documentation

## 2015-01-07 DIAGNOSIS — B001 Herpesviral vesicular dermatitis: Secondary | ICD-10-CM | POA: Diagnosis not present

## 2015-01-07 MED ORDER — ACYCLOVIR 400 MG PO TABS: 400.0000 mg | ORAL_TABLET | Freq: Every day | ORAL | Status: DC

## 2015-01-07 MED ORDER — TADALAFIL 20 MG PO TABS: 20.0000 mg | ORAL_TABLET | Freq: Every day | ORAL | Status: DC | PRN

## 2015-01-07 NOTE — Assessment & Plan Note (Signed)
S: Years of issues. Usually upper lip on right or left. If takes acyclovir as soon as notes tingling usually gone within 2-3 days of 5x a day. Asked patient to come in because no record of it in problem list, history, or med list.  A/P: Refilled medicine- discussed 5 days in a row maximum before follow up

## 2015-01-07 NOTE — Progress Notes (Signed)
[name], MD  Subjective:  [name] is a 69 y.o. year old very pleasant male patient who presents for/with See problem oriented charting ROS- No chest pain or shortness of breath- also can go up stairs or exert himself without this occuring. No headache or blurry vision.   Past Medical History-  Patient Active Problem List   Diagnosis Date Noted  . Hypothyroid 04/28/2011    Priority: Medium  . Hyperlipidemia 12/09/2006    Priority: Medium  . Essential hypertension 08/18/2006    Priority: Medium  . Knee osteoarthritis 04/25/2014    Priority: Low  . GERD (gastroesophageal reflux disease) 08/03/2012    Priority: Low  . Osteoarthritis 02/27/2010    Priority: Low  . Gout 08/18/2006    Priority: Low  . Personal history of rectal adenoma with high-grade dysplasia 08/18/2006    Priority: Low  . Recurrent cold sores 01/07/2015  . Erectile dysfunction 01/07/2015  . S/P right THA, AA 10/08/2014    Medications- reviewed and updated Current Outpatient Prescriptions  Medication Sig Dispense Refill  . amLODipine-olmesartan (AZOR) 10-40 MG per tablet TAKE 1 TABLET DAILY 90 tablet 3  . docusate sodium (COLACE) 100 MG capsule Take 1 capsule (100 mg total) by mouth 2 (two) times daily. 10 capsule 0  . ferrous sulfate 325 (65 FE) MG tablet Take 1 tablet (325 mg total) by mouth 3 (three) times daily after meals.  3  . glucosamine-chondroitin 500-400 MG tablet Take 2 tablets by mouth daily.    Marland Kitchen levothyroxine (SYNTHROID, LEVOTHROID) 88 MCG tablet Take 1 tablet (88 mcg total) by mouth daily. (Patient taking differently: Take 75 mcg by mouth daily. ) 30 tablet 5  . metoprolol succinate (TOPROL-XL) 50 MG 24 hr tablet Take 1 tablet (50 mg total) by mouth daily. Take with or immediately following a meal. 90 tablet 3  . Multiple Vitamin (MULTIVITAMIN WITH MINERALS) TABS tablet Take 1 tablet by mouth daily.    . NON FORMULARY Take 2 capsules by mouth daily. Vitamin to help lower cholesterol     . omeprazole (PRILOSEC) 20 MG capsule TAKE 1 CAPSULE DAILY BEFOREBREAKFAST 90 capsule 3  . Probiotic Product (PROBIOTIC DAILY PO) Take 1 tablet by mouth daily.    Marland Kitchen tiZANidine (ZANAFLEX) 4 MG tablet Take 1 tablet (4 mg total) by mouth every 6 (six) hours as needed for muscle spasms. 40 tablet 0  . HYDROcodone-acetaminophen (NORCO) 7.5-325 MG per tablet Take 1-2 tablets by mouth every 4 (four) hours as needed for moderate pain. (Patient not taking: Reported on 01/07/2015) 100 tablet 0  . nitroGLYCERIN (NITROSTAT) 0.4 MG SL tablet Place 0.4 mg under the tongue every 5 (five) minutes as needed for chest pain.     . polyethylene glycol (MIRALAX / GLYCOLAX) packet Take 17 g by mouth 2 (two) times daily. (Patient not taking: Reported on 01/07/2015) 14 each 0   Objective: BP 130/80 mmHg  Pulse 69  Temp(Src) 98.5 F (36.9 C)  Wt 244 lb (110.678 kg) Gen: NAD, resting comfortably No active cold sores. Mucous membranes are moist. CV: RRR no murmurs rubs or gallops Lungs: CTAB no crackles, wheeze, rhonchi Abdomen: soft/nontender/nondistended/normal bowel sounds. No rebound or guarding.  Skin: warm, dry Neuro: grossly normal, moves all extremities  Assessment/Plan:  Recurrent cold sores S: Years of issues. Usually upper lip on right or left. If takes acyclovir as soon as notes tingling usually gone within 2-3 days of 5x a day. Asked patient to come in because no record of  it in problem list, history, or med list.  A/P: Refilled medicine- discussed 5 days in a row maximum before follow up   Erectile dysfunction S: Patient states has had some issues for past few years but after hip surgery with less activity has been worse. Difficulty with obtaining, maintaining, and firmness. Never tried medicine A/P: Cialis 20mg  given- instructed 1/2 tab. Has nitroglycerin from cardiology years ago just "in case" but has never needed it and was not in fact told he had CAD. Patient aware to not use either of these  medicines within 24 hours of each other. Can use viagra if not covered. Also gave [facility] pharmacy handout which we could try if needed   Return precautions advised.   Meds ordered this encounter  Medications  . acyclovir (ZOVIRAX) 400 MG tablet    Sig: Take 1 tablet (400 mg total) by mouth 5 (five) times daily. For 5 days maximum for cold sores    Dispense:  50 tablet    Refill:  1  . tadalafil (CIALIS) 20 MG tablet    Sig: Take 1 tablet (20 mg total) by mouth daily as needed for erectile dysfunction.    Dispense:  10 tablet    Refill:  5

## 2015-01-07 NOTE — Patient Instructions (Addendum)
Refilled acyclovir   Trial cialis  Have a great trip and Happy Thanksgiving!

## 2015-01-07 NOTE — Assessment & Plan Note (Signed)
S: Patient states has had some issues for past few years but after hip surgery with less activity has been worse. Difficulty with obtaining, maintaining, and firmness. Never tried medicine A/P: Cialis 20mg  given- instructed 1/2 tab. Has nitroglycerin from cardiology years ago just "in case" but has never needed it and was not in fact told he had CAD. Patient aware to not use either of these medicines within 24 hours of each other. Can use viagra if not covered. Also gave [facility] pharmacy handout which we could try if needed

## 2015-01-22 ENCOUNTER — Other Ambulatory Visit: Payer: Self-pay | Admitting: Family Medicine

## 2015-02-04 ENCOUNTER — Other Ambulatory Visit: Payer: Self-pay | Admitting: Family Medicine

## 2015-02-04 ENCOUNTER — Telehealth: Payer: Self-pay | Admitting: Family Medicine

## 2015-02-04 DIAGNOSIS — E785 Hyperlipidemia, unspecified: Secondary | ICD-10-CM

## 2015-02-04 NOTE — Telephone Encounter (Signed)
Ok to enter LDL?

## 2015-02-04 NOTE — Telephone Encounter (Signed)
Yes as well as total cholesterol and triglycerides

## 2015-02-04 NOTE — Telephone Encounter (Signed)
[name] called saying he was told by Dr. Yong Channel that after taking a new medication for a certain amount of time he'd need to have labs performed. He was calling to make sure he had permission to have the labs drawn. Please give him a call regarding this.  Pt's ph# [phone] Thank you.

## 2015-02-04 NOTE — Telephone Encounter (Signed)
Labs ordered. Please schedule pt for lab visit.

## 2015-02-06 ENCOUNTER — Other Ambulatory Visit (INDEPENDENT_AMBULATORY_CARE_PROVIDER_SITE_OTHER): Payer: BLUE CROSS/BLUE SHIELD

## 2015-02-06 DIAGNOSIS — E785 Hyperlipidemia, unspecified: Secondary | ICD-10-CM | POA: Diagnosis not present

## 2015-02-06 LAB — LIPID PANEL
CHOL/HDL RATIO: 5
Cholesterol: 196 mg/dL (ref 0–200)
HDL: 42.2 mg/dL (ref >39.00)
NONHDL: 153.93
TRIGLYCERIDES: 207 mg/dL — AB (ref 0.0–149.0)
VLDL: 41.4 mg/dL — ABNORMAL HIGH (ref 0.0–40.0)

## 2015-02-06 LAB — LDL CHOLESTEROL, DIRECT: Direct LDL: 124 mg/dL

## 2015-03-24 ENCOUNTER — Telehealth: Payer: Self-pay | Admitting: Family Medicine

## 2015-03-24 MED ORDER — LEVOTHYROXINE SODIUM 88 MCG PO TABS: 88.0000 ug | ORAL_TABLET | Freq: Every day | ORAL | Status: DC

## 2015-03-24 NOTE — Telephone Encounter (Signed)
Medication refilled

## 2015-03-24 NOTE — Telephone Encounter (Signed)
Pt request refill of the following: levothyroxine (SYNTHROID, LEVOTHROID) 88 MCG tablet   Pt req 90 day supply    [facility]am[facility]: [facility]

## 2015-03-31 ENCOUNTER — Ambulatory Visit (INDEPENDENT_AMBULATORY_CARE_PROVIDER_SITE_OTHER): Payer: BLUE CROSS/BLUE SHIELD | Admitting: Family Medicine

## 2015-03-31 ENCOUNTER — Encounter: Payer: Self-pay | Admitting: Family Medicine

## 2015-03-31 VITALS — BP 140/80 | HR 63 | Temp 98.2°F | Wt 242.4 lb

## 2015-03-31 DIAGNOSIS — B029 Zoster without complications: Secondary | ICD-10-CM | POA: Diagnosis not present

## 2015-03-31 DIAGNOSIS — R198 Other specified symptoms and signs involving the digestive system and abdomen: Secondary | ICD-10-CM | POA: Diagnosis not present

## 2015-03-31 NOTE — Progress Notes (Signed)
Subjective:    Patient ID: [name], male    DOB: 01/11/1946, 70 y.o.   MRN: [id number]  HPI  [name] is a 70 year old male who presents today for reevaluation of shingles rash after being treated in an urgent care setting 2 weeks ago. Today, the rash is healing without s/sx of infection. All lesions present are noted on the right abdominal wall and right lower back which have formed crusts. He notes periods of pain which "come and go" daily. He notes that pain may be a 1-2 or can be as high as an 8 at times. He states that this is tolerable but wanted to be reevaluated because he notes a mild abdominal asymmetry on the right side where healing lesions are location. He denies any dysphagia, abdominal cramping or pain, constipation, diarrhea, vomiting,reflux, fever, chills, or sweats. He notes associated symptoms of increased gas at times which is controlled with OTC Gas X. He is UTD with colonoscopy and is currently being revaluated every 5 years due to history of rectal adenoma.   Review of Systems  Constitutional: Negative for fever, chills and fatigue.  Respiratory: Negative for cough, chest tightness and shortness of breath.   Cardiovascular: Negative for chest pain and palpitations.  Gastrointestinal: Negative for nausea, vomiting, abdominal pain, diarrhea, constipation and blood in stool.  Genitourinary: Negative for dysuria, urgency, hematuria and flank pain.  Musculoskeletal: Negative for myalgias, back pain and arthralgias.  Skin: Negative for pallor.       Shingles rash right side of abdominal and lower back following a dermatomal pattern healing  Neurological: Negative for light-headedness and headaches.   Past Medical History  Diagnosis Date  . Gout   . GI bleed 2005    after colonoscopy  . Hyperlipidemia   . GERD (gastroesophageal reflux disease)   . Personal history of rectal adenoma with high-grade dysplasia and colonic adenomas   . Benign fundic gland polyps of  stomach   . Irregular bowel habits     occ. irregular bowel elimination  . HTN (hypertension)   . Hypothyroidism   . Arthritis   . Abnormal mammogram 04/03/2008    L side- lipitor related, resolved off medication    . Cancer ([place])     jaw removal due to cancer- 40 years ago     Social History   Social History  . Marital Status: Married    Spouse Name: N/A  . Number of Children: 2  . Years of Education: N/A   Occupational History  .     Social History Main Topics  . Smoking status: Never Smoker   . Smokeless tobacco: Never Used  . Alcohol Use: 5.0 oz/week    10 Standard drinks or equivalent per week     Comment: occasional   . Drug Use: No  . Sexual Activity: Yes   Other Topics Concern  . Not on file   Social History Narrative   Married 57 years (wife patient of Dr. Yong Channel), 1 son attorney in [place], daughter with 3 granddaughters.       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    Considering retirement around 2018.   Will probably still work in some form.       Hobbies: garden, cooking, beach trips    Past Surgical History  Procedure Laterality Date  . Tumor removal  1984    from [place] from hip in jaw  . Knee arthroscopy  2011    Dr. Tonita Cong  . Total knee arthroplasty  6/11, 09/2013    Dr. Tonita Cong  . Colonoscopy    . Upper gastrointestinal endoscopy    . Knee arthroscopy with medial menisectomy Left 06/29/2013    Procedure: LEFT KNEE ARTHROSCOPY WITH DEBRIDEMENT AND A PARTIAL MEDIAL MENISECTOMY;  Surgeon: Johnn Hai, MD;  Location: [facility];  Service: Orthopedics;  Laterality: Left;  . Tonsillectomy    . Total knee arthroplasty Left 09/28/2013    Procedure: LEFT TOTAL KNEE ARTHROPLASTY;  Surgeon: Johnn Hai, MD;  Location: [facility];  Service: Orthopedics;  Laterality: Left;  . Joint replacement Right 09/2013    left knee0 09/2013, right knee- 2011   . Total hip arthroplasty Right 10/08/2014     Procedure: RIGHT TOTAL HIP ARTHROPLASTY ANTERIOR APPROACH;  Surgeon: Paralee Cancel, MD;  Location: [facility];  Service: Orthopedics;  Laterality: Right;    Family History  Problem Relation Age of Onset  . Stroke Mother   . Diabetes Mother   . Colon cancer Neg Hx     Allergies  Allergen Reactions  . Ace Inhibitors     Gout  . Atorvastatin     REACTION: breast tenderness  . Doxycycline     REACTION: stomach cramping. flatus    Current Outpatient Prescriptions on File Prior to Visit  Medication Sig Dispense Refill  . acyclovir (ZOVIRAX) 400 MG tablet Take 1 tablet (400 mg total) by mouth 5 (five) times daily. For 5 days maximum for cold sores 50 tablet 1  . amLODipine-olmesartan (AZOR) 10-40 MG per tablet TAKE 1 TABLET DAILY 90 tablet 3  . docusate sodium (COLACE) 100 MG capsule Take 1 capsule (100 mg total) by mouth 2 (two) times daily. 10 capsule 0  . ferrous sulfate 325 (65 FE) MG tablet Take 1 tablet (325 mg total) by mouth 3 (three) times daily after meals.  3  . glucosamine-chondroitin 500-400 MG tablet Take 2 tablets by mouth daily.    Marland Kitchen HYDROcodone-acetaminophen (NORCO) 7.5-325 MG per tablet Take 1-2 tablets by mouth every 4 (four) hours as needed for moderate pain. 100 tablet 0  . levothyroxine (SYNTHROID, LEVOTHROID) 88 MCG tablet Take 1 tablet (88 mcg total) by mouth daily. 90 tablet 2  . metoprolol succinate (TOPROL-XL) 50 MG 24 hr tablet Take 1 tablet (50 mg total) by mouth daily. Take with or immediately following a meal. 90 tablet 3  . Multiple Vitamin (MULTIVITAMIN WITH MINERALS) TABS tablet Take 1 tablet by mouth daily.    . nitroGLYCERIN (NITROSTAT) 0.4 MG SL tablet Place 0.4 mg under the tongue every 5 (five) minutes as needed for chest pain.     . NON FORMULARY Take 2 capsules by mouth daily. Vitamin to help lower cholesterol    . omeprazole (PRILOSEC) 20 MG capsule TAKE 1 CAPSULE DAILY BEFOREBREAKFAST 90 capsule 3  . polyethylene glycol (MIRALAX / GLYCOLAX) packet Take  17 g by mouth 2 (two) times daily. 14 each 0  . Probiotic Product (PROBIOTIC DAILY PO) Take 1 tablet by mouth daily.    . tadalafil (CIALIS) 20 MG tablet Take 1 tablet (20 mg total) by mouth daily as needed for erectile dysfunction. 10 tablet 5  . tiZANidine (ZANAFLEX) 4 MG tablet Take 1 tablet (4 mg total) by mouth every 6 (six) hours as needed for muscle spasms. 40 tablet 0   No current facility-administered medications on file prior to visit.    BP 140/80 mmHg  Pulse 63  Temp(Src)  98.2 F (36.8 C) (Oral)  Wt 242 lb 6.4 oz (109.952 kg)  SpO2 96%       Objective:   Physical Exam  Constitutional: He is oriented to person, place, and time. He appears well-developed and well-nourished.  Cardiovascular: Normal rate and regular rhythm.   Pulmonary/Chest: Effort normal and breath sounds normal.  Abdominal: Soft. Bowel sounds are normal. He exhibits no distension. There is no tenderness. There is no rebound and no guarding.  Lymphadenopathy:    He has no cervical adenopathy.  Neurological: He is alert and oriented to person, place, and time.  Skin: Skin is warm and dry.  Crusted lesions on right side of abdomen following dermatomal pattern to right side of lower back. No new lesions present or noted  Psychiatric: He has a normal mood and affect.      Assessment & Plan:  1. Shingles  Crusted, healing, shingles rash noted. Patient notes that pain is present but he does have times every day where he does not notice pain. He does not wish to have additional pain therapy at this time. He stated that the provider at urgent care provided him with pain medication which he will use at night occasionally. He is unable to remember the name of the medication at this visit.  Discussed with patient that pain and itching can occur for an extended period of time with a shingles outbreak.  If he notices any new lesions present or pain level increases, he is encouraged to follow up with PCP.   2.  Asymmetry of abdominal wall  Mild right sided abdominal asymmetry is noted. Exam and history support possible muscular laxity in this area. Patient reports 2 hour daily commute to work and notes that he noticed this after looking at shingles rash. No pain noted other than shingles pain pattern. Advised patient to take short breaks every hour when driving to stretch and walk. Encouraged him to follow up with PCP in 2-4 weeks for further evaluation of his abdominal musculature if needed.

## 2015-03-31 NOTE — Progress Notes (Signed)
Pre visit review using our clinic review tool, if applicable. No additional management support is needed unless otherwise documented below in the visit note. 

## 2015-03-31 NOTE — Patient Instructions (Signed)
Continue pain control treatment for shingles as directed. Rash is healing without signs of infection. Abdominal asymmetry is mild which may be related to relaxing musculature and daily 2 hour commute. Consider taking short breaks every hour when driving to and from work. Walking and stretching after extended periods of time sitting can be beneficial. If you experience any new symptoms such as abdominal pain, cramping, fever, or difficulty with bowel or bladder, please contact clinic for further evaluation.  Otherwise, follow up with PCP in 2-4 weeks for further evaluation if needed. It was a pleasure meeting you today.

## 2015-04-21 ENCOUNTER — Telehealth: Payer: Self-pay | Admitting: Family Medicine

## 2015-04-21 NOTE — Telephone Encounter (Signed)
Pt saw julie on 2-13 and needs to follow up on shingles. Pt is avail on 05-01-15 or 05-02-15. Can I use sda slot? Pt is travelling a lot

## 2015-04-22 NOTE — Telephone Encounter (Signed)
That's fine

## 2015-04-22 NOTE — Telephone Encounter (Signed)
Pt has been scheduled.  °

## 2015-04-22 NOTE — Telephone Encounter (Signed)
lmom for pt to call back

## 2015-05-01 ENCOUNTER — Encounter: Payer: Self-pay | Admitting: Family Medicine

## 2015-05-01 ENCOUNTER — Ambulatory Visit (INDEPENDENT_AMBULATORY_CARE_PROVIDER_SITE_OTHER): Payer: BLUE CROSS/BLUE SHIELD | Admitting: Family Medicine

## 2015-05-01 ENCOUNTER — Other Ambulatory Visit: Payer: Self-pay | Admitting: Family Medicine

## 2015-05-01 VITALS — BP 150/82 | HR 66 | Temp 98.2°F | Wt 243.0 lb

## 2015-05-01 DIAGNOSIS — B029 Zoster without complications: Secondary | ICD-10-CM | POA: Diagnosis not present

## 2015-05-01 DIAGNOSIS — Z20828 Contact with and (suspected) exposure to other viral communicable diseases: Secondary | ICD-10-CM | POA: Diagnosis not present

## 2015-05-01 DIAGNOSIS — R1900 Intra-abdominal and pelvic swelling, mass and lump, unspecified site: Secondary | ICD-10-CM

## 2015-05-01 MED ORDER — TRAMADOL HCL 50 MG PO TABS: 25.0000 mg | ORAL_TABLET | Freq: Three times a day (TID) | ORAL | Status: DC | PRN

## 2015-05-01 NOTE — Progress Notes (Signed)
Dwayne Reddish, MD  Subjective:  [name] is a 70 y.o. year old very pleasant male patient who presents for/with See problem oriented charting ROS- no fever chills, unintentional weight loss. No nausea or vomiting. No change in bowel habits  Past Medical History-  Patient Active Problem List   Diagnosis Date Noted  . Hypothyroid 04/28/2011    Priority: Medium  . Hyperlipidemia 12/09/2006    Priority: Medium  . Essential hypertension 08/18/2006    Priority: Medium  . Knee osteoarthritis 04/25/2014    Priority: Low  . GERD (gastroesophageal reflux disease) 08/03/2012    Priority: Low  . Osteoarthritis 02/27/2010    Priority: Low  . Gout 08/18/2006    Priority: Low  . Personal history of rectal adenoma with high-grade dysplasia 08/18/2006    Priority: Low  . Recurrent cold sores 01/07/2015  . Erectile dysfunction 01/07/2015  . S/P right THA, AA 10/08/2014    Medications- reviewed and updated Current Outpatient Prescriptions  Medication Sig Dispense Refill  . amLODipine-olmesartan (AZOR) 10-40 MG per tablet TAKE 1 TABLET DAILY 90 tablet 3  . glucosamine-chondroitin 500-400 MG tablet Take 2 tablets by mouth daily.    Marland Kitchen levothyroxine (SYNTHROID, LEVOTHROID) 88 MCG tablet Take 1 tablet (88 mcg total) by mouth daily. 90 tablet 2  . metoprolol succinate (TOPROL-XL) 50 MG 24 hr tablet Take 1 tablet (50 mg total) by mouth daily. Take with or immediately following a meal. 90 tablet 3  . Multiple Vitamin (MULTIVITAMIN WITH MINERALS) TABS tablet Take 1 tablet by mouth daily.    . NON FORMULARY Take 2 capsules by mouth daily. Vitamin to help lower cholesterol    . omeprazole (PRILOSEC) 20 MG capsule TAKE 1 CAPSULE DAILY BEFOREBREAKFAST 90 capsule 3  . Probiotic Product (PROBIOTIC DAILY PO) Take 1 tablet by mouth daily.    Marland Kitchen acyclovir (ZOVIRAX) 400 MG tablet Take 1 tablet (400 mg total) by mouth 5 (five) times daily. For 5 days maximum for cold sores (Patient not taking: Reported on  05/01/2015) 50 tablet 1  . nitroGLYCERIN (NITROSTAT) 0.4 MG SL tablet Place 0.4 mg under the tongue every 5 (five) minutes as needed for chest pain. Reported on 05/01/2015    . tadalafil (CIALIS) 20 MG tablet Take 1 tablet (20 mg total) by mouth daily as needed for erectile dysfunction. (Patient not taking: Reported on 05/01/2015) 10 tablet 5  . traMADol (ULTRAM) 50 MG tablet Take 0.5-1 tablets (25-50 mg total) by mouth every 8 (eight) hours as needed. 30 tablet 0   No current facility-administered medications for this visit.    Objective: BP 150/82 mmHg  Pulse 66  Temp(Src) 98.2 F (36.8 C)  Wt 243 lb (110.224 kg) Gen: NAD, resting comfortably CV: RRR no murmurs rubs or gallops Lungs: CTAB no crackles, wheeze, rhonchi Abdomen: soft/nontender/nondistended/normal bowel sounds. No rebound or guarding. Abdominal mass in right flank area (see picture) Ext: no edema Skin: warm, dry, erythema over right back Neuro: grossly normal, moves all extremities       Assessment/Plan:  Shingles S:03/09/15 diagnosed at urgent care and treated with prednisone, valtrex 7 days,Tramadol prn.  03/31/15 Seen in clinic. Also noted to have some right sided assymetry which he had not known before.  05/01/15 today- continues to complain. 5/10 band around right abdomen. Hard to sleep at night. A lot of gas new to him- taking gas-x daily. Takes some aleve pm. Rare takes tramadol prn usually half tab. Pain worse with higher anxiety. Rash with crusting has  resolved but has some remaining erythema noted on his back A/P: refill tramadol for prn use. He declines daily medicine such as lyrica or gabapentin. Discussed up to 3-4 months before we would call this postherpetic neuralgia  Abdominal wall mass of right flank - Plan: CT Abdomen Pelvis W Contrast, Basic metabolic panel S: patient has noted this bulge since the shingles at least but may have been there longer. As noted in above section- seems to have more gas and  bloating. Does not seem to be enlarging since finding this. Does have pain in distribution bu tthought due to shingles.  A/P: large mass of unclear etiology- doubt this is just laxity of abdominal muscles but guess this is possible. We will get CT abdomen/pelvis to further evaluate. Of note had colonoscopy 07/2012 with plan for repeat 07/2015 per Dr. Carlean Purl note- did have prior high grade dysplasia adenoma  PRN follow up after CT. Return precautions advised.   Orders Placed This Encounter  Procedures  . CT Abdomen Pelvis W Contrast    SS/ Hilda Blades [id number]/ [employer]/ not diabetic/ getting labs     Standing Status: Future     Number of Occurrences:      Standing Expiration Date: 07/31/2016    Order Specific Question:  If indicated for the ordered procedure, I authorize the administration of contrast media per Radiology protocol    Answer:  Yes    Order Specific Question:  Reason for Exam (SYMPTOM  OR DIAGNOSIS REQUIRED)    Answer:  abdominal mass right side    Order Specific Question:  Preferred imaging location?    Answer:  [place]  . Basic metabolic panel    [facility]  . Hepatitis C antibody, reflex    [facility]    Meds ordered this encounter  Medications  . traMADol (ULTRAM) 50 MG tablet    Sig: Take 0.5-1 tablets (25-50 mg total) by mouth every 8 (eight) hours as needed.    Dispense:  30 tablet    Refill:  0

## 2015-05-01 NOTE — Patient Instructions (Addendum)
We will call you within a week about your CT scan. If you do not hear within 2 weeks, give [place] a call.   Need a blood test today to make sure kidneys ok for this test  Refilled tramadol for pain- hopeful this will resolve completely within 4 months from onset  Health Maintenance Due  Topic Date Due  . Hepatitis C Screening - also ordered with labs 07/26/1945  . ZOSTAVAX - would wait at least a year after this episode 08/23/2005

## 2015-05-02 LAB — BASIC METABOLIC PANEL
BUN: 19 mg/dL (ref 6–23)
CHLORIDE: 105 meq/L (ref 96–112)
CO2: 28 mEq/L (ref 19–32)
Calcium: 8.9 mg/dL (ref 8.4–10.5)
Creatinine, Ser: 0.97 mg/dL (ref 0.40–1.50)
GFR: 81.4 mL/min (ref >60.00)
GLUCOSE: 101 mg/dL — AB (ref 70–99)
POTASSIUM: 3.7 meq/L (ref 3.5–5.1)
Sodium: 141 mEq/L (ref 135–145)

## 2015-05-02 LAB — HEPATITIS C ANTIBODY: HCV Ab: NEGATIVE

## 2015-05-08 ENCOUNTER — Ambulatory Visit (INDEPENDENT_AMBULATORY_CARE_PROVIDER_SITE_OTHER)
Admission: RE | Admit: 2015-05-08 | Discharge: 2015-05-08 | Disposition: A | Discharge status: Home / Self Care | Payer: BLUE CROSS/BLUE SHIELD | Source: Ambulatory Visit | Attending: Family Medicine | Admitting: Family Medicine

## 2015-05-08 DIAGNOSIS — R1900 Intra-abdominal and pelvic swelling, mass and lump, unspecified site: Secondary | ICD-10-CM | POA: Diagnosis not present

## 2015-05-08 MED ORDER — IOPAMIDOL (ISOVUE-300) INJECTION 61%
100.0000 mL | Freq: Once | INTRAVENOUS | Status: AC | PRN
  Administered 2015-05-08: 100 mL via INTRAVENOUS

## 2015-05-09 ENCOUNTER — Telehealth: Payer: Self-pay | Admitting: Family Medicine

## 2015-05-09 NOTE — Telephone Encounter (Signed)
We discussed Ct scan.   Plan: 1. Keba please refer to urology under nephrolithiasis, kidney cyst, enlarged prostate.  See if it is possible to work him in within 3 weeks or so.  2. Bevelyn Ngo please order PSA and patient is to call to schedule appointment with labs. If he has not done this within a week- let's reach out to him.

## 2015-05-09 NOTE — Telephone Encounter (Signed)
Pt would like for Dr. Yong Channel to call him before he leaves today.

## 2015-05-12 ENCOUNTER — Other Ambulatory Visit: Payer: Self-pay | Admitting: Family Medicine

## 2015-05-12 DIAGNOSIS — N200 Calculus of kidney: Secondary | ICD-10-CM

## 2015-05-12 DIAGNOSIS — N400 Enlarged prostate without lower urinary tract symptoms: Secondary | ICD-10-CM

## 2015-05-12 DIAGNOSIS — N281 Cyst of kidney, acquired: Secondary | ICD-10-CM

## 2015-05-12 NOTE — Telephone Encounter (Signed)
PSA and referrak to urology ordered.

## 2015-09-09 ENCOUNTER — Encounter: Payer: Self-pay | Admitting: Internal Medicine

## 2015-10-10 ENCOUNTER — Other Ambulatory Visit: Payer: Self-pay | Admitting: Family Medicine

## 2015-10-22 ENCOUNTER — Other Ambulatory Visit: Payer: Self-pay

## 2015-10-22 ENCOUNTER — Telehealth: Payer: Self-pay | Admitting: Family Medicine

## 2015-10-22 MED ORDER — AMLODIPINE-OLMESARTAN 10-40 MG PO TABS: ORAL_TABLET | ORAL | 3 refills | Status: DC

## 2015-10-22 NOTE — Telephone Encounter (Signed)
Prescription sent to pharmacy as requested.

## 2015-10-22 NOTE — Telephone Encounter (Signed)
Pt needs refill on azor 10-40 #90 w/refills send to [facility]

## 2015-12-25 ENCOUNTER — Telehealth: Payer: Self-pay | Admitting: Family Medicine

## 2015-12-25 ENCOUNTER — Other Ambulatory Visit: Payer: Self-pay

## 2015-12-25 MED ORDER — METOPROLOL SUCCINATE ER 50 MG PO TB24: ORAL_TABLET | ORAL | 1 refills | Status: DC

## 2015-12-25 MED ORDER — OMEPRAZOLE 20 MG PO CPDR: DELAYED_RELEASE_CAPSULE | ORAL | 0 refills | Status: DC

## 2015-12-25 NOTE — Telephone Encounter (Signed)
Pt request refill  metoprolol succinate (TOPROL-XL) 50 MG 24 hr tablet omeprazole (PRILOSEC) 20 MG capsule  [facility]

## 2015-12-25 NOTE — Telephone Encounter (Signed)
Prescriptions sent to pharmacy as requested 

## 2016-01-23 ENCOUNTER — Telehealth: Payer: Self-pay | Admitting: Family Medicine

## 2016-01-23 NOTE — Telephone Encounter (Signed)
Pt state the he need new Rx to go to [facility] due to the old company has been bought out and they need new Rx so that they can fill for the patient they do not transfer Rx's.  Rx's needed are amLODipine,metoprolol succinate, omeprazole and SYNTHROID.  Pt state that this information needs to be given so that he is able to get his medication. New insurance card for the [employer]  ([id number])  [id number]  Rx BIN [id number]   Rx PCN  Adv   Rx GRP  Rx [phone]

## 2016-02-04 ENCOUNTER — Other Ambulatory Visit: Payer: Self-pay

## 2016-02-04 MED ORDER — LEVOTHYROXINE SODIUM 88 MCG PO TABS: 88.0000 ug | ORAL_TABLET | Freq: Every day | ORAL | 1 refills | Status: DC

## 2016-02-04 MED ORDER — AMLODIPINE-OLMESARTAN 10-40 MG PO TABS: ORAL_TABLET | ORAL | 3 refills | Status: DC

## 2016-02-04 MED ORDER — OMEPRAZOLE 20 MG PO CPDR: DELAYED_RELEASE_CAPSULE | ORAL | 1 refills | Status: DC

## 2016-02-04 MED ORDER — METOPROLOL SUCCINATE ER 50 MG PO TB24: ORAL_TABLET | ORAL | 1 refills | Status: DC

## 2016-02-04 NOTE — Telephone Encounter (Signed)
Prescriptions sent to the pharmacy as requested.

## 2016-03-15 ENCOUNTER — Other Ambulatory Visit (INDEPENDENT_AMBULATORY_CARE_PROVIDER_SITE_OTHER): Payer: BLUE CROSS/BLUE SHIELD

## 2016-03-15 DIAGNOSIS — N400 Enlarged prostate without lower urinary tract symptoms: Secondary | ICD-10-CM | POA: Diagnosis not present

## 2016-03-15 DIAGNOSIS — Z0000 Encounter for general adult medical examination without abnormal findings: Secondary | ICD-10-CM

## 2016-03-15 LAB — HEPATIC FUNCTION PANEL
ALBUMIN: 4.1 g/dL (ref 3.5–5.2)
ALK PHOS: 73 U/L (ref 39–117)
ALT: 14 U/L (ref 0–53)
AST: 17 U/L (ref 0–37)
Bilirubin, Direct: 0.1 mg/dL (ref 0.0–0.3)
TOTAL PROTEIN: 6.2 g/dL (ref 6.0–8.3)
Total Bilirubin: 0.6 mg/dL (ref 0.2–1.2)

## 2016-03-15 LAB — POC URINALSYSI DIPSTICK (AUTOMATED)
BILIRUBIN UA: NEGATIVE
Blood, UA: NEGATIVE
GLUCOSE UA: NEGATIVE
Ketones, UA: NEGATIVE
LEUKOCYTES UA: NEGATIVE
NITRITE UA: NEGATIVE
PH UA: 7
Spec Grav, UA: 1.02
Urobilinogen, UA: 0.2

## 2016-03-15 LAB — LIPID PANEL
Cholesterol: 223 mg/dL — ABNORMAL HIGH (ref 0–200)
HDL: 47.9 mg/dL (ref >39.00)
LDL Cholesterol: 138 mg/dL — ABNORMAL HIGH (ref 0–99)
NonHDL: 175.41
Total CHOL/HDL Ratio: 5
Triglycerides: 186 mg/dL — ABNORMAL HIGH (ref 0.0–149.0)
VLDL: 37.2 mg/dL (ref 0.0–40.0)

## 2016-03-15 LAB — BASIC METABOLIC PANEL
BUN: 17 mg/dL (ref 6–23)
CALCIUM: 8.9 mg/dL (ref 8.4–10.5)
CO2: 28 meq/L (ref 19–32)
Chloride: 102 mEq/L (ref 96–112)
Creatinine, Ser: 1.05 mg/dL (ref 0.40–1.50)
GFR: 74.1 mL/min (ref >60.00)
Glucose, Bld: 116 mg/dL — ABNORMAL HIGH (ref 70–99)
POTASSIUM: 3.9 meq/L (ref 3.5–5.1)
SODIUM: 138 meq/L (ref 135–145)

## 2016-03-15 LAB — CBC WITH DIFFERENTIAL/PLATELET
BASOS ABS: 0 10*3/uL (ref 0.0–0.1)
Basophils Relative: 0.3 % (ref 0.0–3.0)
Eosinophils Absolute: 0.2 10*3/uL (ref 0.0–0.7)
Eosinophils Relative: 3.9 % (ref 0.0–5.0)
HEMATOCRIT: 48.9 % (ref 39.0–52.0)
Hemoglobin: 17.1 g/dL — ABNORMAL HIGH (ref 13.0–17.0)
LYMPHS PCT: 23.6 % (ref 12.0–46.0)
Lymphs Abs: 1.2 10*3/uL (ref 0.7–4.0)
MCHC: 35 g/dL (ref 30.0–36.0)
MCV: 92.1 fl (ref 78.0–100.0)
Monocytes Absolute: 0.5 10*3/uL (ref 0.1–1.0)
Monocytes Relative: 10.1 % (ref 3.0–12.0)
Neutro Abs: 3.2 10*3/uL (ref 1.4–7.7)
Neutrophils Relative %: 62.1 % (ref 43.0–77.0)
Platelets: 204 10*3/uL (ref 150.0–400.0)
RBC: 5.3 Mil/uL (ref 4.22–5.81)
RDW: 13.4 % (ref 11.5–15.5)
WBC: 5.2 10*3/uL (ref 4.0–10.5)

## 2016-03-15 LAB — PSA: PSA: 1.4 ng/mL (ref 0.10–4.00)

## 2016-03-15 LAB — TSH: TSH: 5.45 u[IU]/mL — AB (ref 0.35–4.50)

## 2016-03-22 ENCOUNTER — Encounter: Payer: Self-pay | Admitting: Family Medicine

## 2016-03-22 ENCOUNTER — Ambulatory Visit (INDEPENDENT_AMBULATORY_CARE_PROVIDER_SITE_OTHER): Payer: BLUE CROSS/BLUE SHIELD | Admitting: Family Medicine

## 2016-03-22 VITALS — BP 146/84 | HR 66 | Temp 98.5°F | Ht 69.25 in | Wt 243.6 lb

## 2016-03-22 DIAGNOSIS — I10 Essential (primary) hypertension: Secondary | ICD-10-CM

## 2016-03-22 DIAGNOSIS — E785 Hyperlipidemia, unspecified: Secondary | ICD-10-CM | POA: Diagnosis not present

## 2016-03-22 DIAGNOSIS — Z0001 Encounter for general adult medical examination with abnormal findings: Secondary | ICD-10-CM | POA: Diagnosis not present

## 2016-03-22 DIAGNOSIS — R739 Hyperglycemia, unspecified: Secondary | ICD-10-CM

## 2016-03-22 DIAGNOSIS — E039 Hypothyroidism, unspecified: Secondary | ICD-10-CM | POA: Diagnosis not present

## 2016-03-22 MED ORDER — LEVOTHYROXINE SODIUM 100 MCG PO TABS: 100.0000 ug | ORAL_TABLET | Freq: Every day | ORAL | 3 refills | Status: DC

## 2016-03-22 MED ORDER — METOPROLOL SUCCINATE ER 50 MG PO TB24: ORAL_TABLET | ORAL | 3 refills | Status: DC

## 2016-03-22 MED ORDER — ACYCLOVIR 400 MG PO TABS: 400.0000 mg | ORAL_TABLET | Freq: Every day | ORAL | 1 refills | Status: DC

## 2016-03-22 MED ORDER — TADALAFIL 20 MG PO TABS: 20.0000 mg | ORAL_TABLET | Freq: Every day | ORAL | 5 refills | Status: DC | PRN

## 2016-03-22 MED ORDER — OMEPRAZOLE 20 MG PO CPDR: DELAYED_RELEASE_CAPSULE | ORAL | 3 refills | Status: DC

## 2016-03-22 MED ORDER — AMLODIPINE-OLMESARTAN 10-40 MG PO TABS: ORAL_TABLET | ORAL | 3 refills | Status: DC

## 2016-03-22 NOTE — Assessment & Plan Note (Signed)
Elevated risk. Advised alternate statin. Firmly declines for primary prevention

## 2016-03-22 NOTE — Assessment & Plan Note (Signed)
on amlodipine-olmesartan 10-40mg , metoprolol 50mg  XL. Avoid hctz with gout BP Readings from Last 3 Encounters:  03/22/16 (!) 146/84  05/01/15 (!) 150/82  03/31/15 140/80  At jnc 8 goal- declines increase in medication. Wants to work on weight loss

## 2016-03-22 NOTE — Assessment & Plan Note (Signed)
Hypothyroid- slightly poorly controlled on 88 mcg. Discussed100 mcg dose. Agreed and repeat tsh 6-8 weeks

## 2016-03-22 NOTE — Progress Notes (Signed)
Pre visit review using our clinic review tool, if applicable. No additional management support is needed unless otherwise documented below in the visit note. 

## 2016-03-22 NOTE — Assessment & Plan Note (Signed)
cbg 116. Needs weight loss. Would consider a1c at follow up

## 2016-03-22 NOTE — Patient Instructions (Addendum)
Return in 6-8 weeks for repeat thyroid test. Schedule before you leave  Once insurance issues get figured out, lets get you set up for colonoscopy repeat  Never take nitroglycerin and cialis within 72 hours of each other   Weight loss is key to reduce risk of diabetes, control blood pressure. Let's check in 6 months to see how blood pressure is doing and consider checking an a1c (3 month blood sugar check)  Shingles shot next year likely since newer vaccine coming out

## 2016-03-22 NOTE — Progress Notes (Signed)
Phone: [phone]  Subjective:  Patient presents today for their annual physical. Chief complaint-noted.   See problem oriented charting- ROS- full  review of systems was completed and negative including No chest pain or shortness of breath. No headache or blurry vision. Breast lumps resolved off lipitor  The following were reviewed and entered/updated in epic: Past Medical History:  Diagnosis Date  . Abnormal mammogram 04/03/2008   L side- lipitor related, resolved off medication    . Arthritis   . Benign fundic gland polyps of stomach   . Cancer ([place])    jaw removal due to cancer- 40 years ago   . GERD (gastroesophageal reflux disease)   . GI bleed 2005   after colonoscopy  . Gout   . HTN (hypertension)   . Hyperlipidemia   . Hypothyroidism   . Irregular bowel habits    occ. irregular bowel elimination  . Personal history of rectal adenoma with high-grade dysplasia and colonic adenomas    Patient Active Problem List   Diagnosis Date Noted  . Recurrent cold sores 01/07/2015    Priority: Medium  . Hypothyroid 04/28/2011    Priority: Medium  . Hyperlipidemia 12/09/2006    Priority: Medium  . Essential hypertension 08/18/2006    Priority: Medium  . Erectile dysfunction 01/07/2015    Priority: Low  . Knee osteoarthritis 04/25/2014    Priority: Low  . GERD (gastroesophageal reflux disease) 08/03/2012    Priority: Low  . Osteoarthritis 02/27/2010    Priority: Low  . Gout 08/18/2006    Priority: Low  . Personal history of rectal adenoma with high-grade dysplasia 08/18/2006    Priority: Low  . Hyperglycemia 03/22/2016  . S/P right THA, AA 10/08/2014   Past Surgical History:  Procedure Laterality Date  . COLONOSCOPY    . JOINT REPLACEMENT Right 09/2013   left knee0 09/2013, right knee- 2011   . KNEE ARTHROSCOPY  2011   Dr. Tonita Cong  . KNEE ARTHROSCOPY WITH MEDIAL MENISECTOMY Left 06/29/2013   Procedure: LEFT KNEE ARTHROSCOPY WITH DEBRIDEMENT AND A PARTIAL MEDIAL  MENISECTOMY;  Surgeon: Johnn Hai, MD;  Location: [facility];  Service: Orthopedics;  Laterality: Left;  . TONSILLECTOMY    . TOTAL HIP ARTHROPLASTY Right 10/08/2014   Procedure: RIGHT TOTAL HIP ARTHROPLASTY ANTERIOR APPROACH;  Surgeon: Paralee Cancel, MD;  Location: [facility];  Service: Orthopedics;  Laterality: Right;  . TOTAL KNEE ARTHROPLASTY  6/11, 09/2013   Dr. Tonita Cong  . TOTAL KNEE ARTHROPLASTY Left 09/28/2013   Procedure: LEFT TOTAL KNEE ARTHROPLASTY;  Surgeon: Johnn Hai, MD;  Location: [facility];  Service: Orthopedics;  Laterality: Left;  . TUMOR REMOVAL  1984   from [place] from hip in jaw  . UPPER GASTROINTESTINAL ENDOSCOPY      Family History  Problem Relation Age of Onset  . Stroke Mother   . Diabetes Mother   . Colon cancer Neg Hx     Medications- reviewed and updated Current Outpatient Prescriptions  Medication Sig Dispense Refill  . acyclovir (ZOVIRAX) 400 MG tablet Take 1 tablet (400 mg total) by mouth 5 (five) times daily. For 5 days maximum for cold sores 50 tablet 1  . amLODipine-olmesartan (AZOR) 10-40 MG tablet TAKE 1 TABLET DAILY 90 tablet 3  . glucosamine-chondroitin 500-400 MG tablet Take 2 tablets by mouth daily.    Marland Kitchen levothyroxine (SYNTHROID, LEVOTHROID) 100 MCG tablet Take 1 tablet (100 mcg total) by mouth daily. 90 tablet 3  . metoprolol succinate (TOPROL-XL) 50 MG  24 hr tablet TAKE 1 TABLET DAILY; TAKE  WITH OR IMMEDIATELY        FOLLOWING A MEAL 90 tablet 3  . Multiple Vitamin (MULTIVITAMIN WITH MINERALS) TABS tablet Take 1 tablet by mouth daily.    . nitroGLYCERIN (NITROSTAT) 0.4 MG SL tablet Place 0.4 mg under the tongue every 5 (five) minutes as needed for chest pain. Reported on 05/01/2015    . NON FORMULARY Take 2 capsules by mouth daily. Vitamin to help lower cholesterol    . omeprazole (PRILOSEC) 20 MG capsule TAKE 1 CAPSULE DAILY BEFORE BREAKFAST 90 capsule 3  . Probiotic Product (PROBIOTIC DAILY PO) Take 1 tablet by mouth daily.     . tadalafil (CIALIS) 20 MG tablet Take 1 tablet (20 mg total) by mouth daily as needed for erectile dysfunction. 10 tablet 5  . traMADol (ULTRAM) 50 MG tablet Take 0.5-1 tablets (25-50 mg total) by mouth every 8 (eight) hours as needed. 30 tablet 0   No current facility-administered medications for this visit.     Allergies-reviewed and updated Allergies  Allergen Reactions  . Ace Inhibitors     Gout  . Atorvastatin     REACTION: breast tenderness  . Doxycycline     REACTION: stomach cramping. flatus    Social History   Social History  . Marital status: Married    Spouse name: N/A  . Number of children: 2  . Years of education: N/A   Occupational History  .  [employer]   Social History Main Topics  . Smoking status: Never Smoker  . Smokeless tobacco: Never Used  . Alcohol use 5.0 oz/week    10 Standard drinks or equivalent per week     Comment: occasional   . Drug use: No  . Sexual activity: Yes   Other Topics Concern  . None   Social History Narrative   Married 44 years (wife patient of Dr. Yong Channel), 1 son attorney in [place], daughter with 3 granddaughters.       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    Considering retirement around 2018.   Will probably still work in some form.       Hobbies: garden, cooking, beach trips    Objective: BP (!) 146/84 (BP Location: Left Arm, Cuff Size: Large)   Pulse 66   Temp 98.5 F (36.9 C) (Oral)   Ht 5' 9.25" (1.759 m)   Wt 243 lb 9.6 oz (110.5 kg)   SpO2 97%   BMI 35.71 kg/m  Gen: NAD, resting comfortably HEENT: Mucous membranes are moist. Oropharynx normal Neck: no thyromegaly CV: RRR no murmurs rubs or gallops Lungs: CTAB no crackles, wheeze, rhonchi Abdomen: soft/nontender/nondistended/normal bowel sounds. No rebound or guarding. obese  Ext: 1+ edema Skin: warm, dry Neuro: grossly normal, moves all extremities, PERRLA Rectal: normal tone, diffusely enlarged  prostate, no masses or tenderness  Assessment/Plan:  71 y.o. male presenting for annual physical.  Health Maintenance counseling: 1. Anticipatory guidance: Patient counseled regarding regular dental exams q6 months, eye exams -plans to get eye exam, wearing seatbelts.  2. Risk factor reduction:  Advised patient of need for regular exercise and diet rich and fruits and vegetables to reduce risk of heart attack and stroke. Exercise- had increased but then decreased with loss of job- hoping to get back to it. Diet-had improved but stress eating with loss of job- encouraged.  Wt Readings from Last 3 Encounters:  03/22/16 243 lb  9.6 oz (110.5 kg)  05/01/15 243 lb (110.2 kg)  03/31/15 242 lb 6.4 oz (110 kg)   3. Immunizations/screenings/ancillary studies Immunization History  Administered Date(s) Administered  . Hepatitis A 03/03/2002  . Hepatitis B 03/03/2002  . Pneumococcal Conjugate-13 04/25/2014  . Pneumococcal Polysaccharide-23 08/30/2012  . Td 03/01/2002  . Tdap 08/30/2012   Health Maintenance Due  Topic Date Due  . ZOSTAVAX - wants to wait on shingrix 08/23/2005  . COLONOSCOPY - see below 08/04/2015   4. Prostate cancer screening- low risk rectal exam but some BPH (nocturia 1-2x a night) and low risk psa trend. Discussed likely stopping 75 latest- wants to continue for now  Lab Results  Component Value Date   PSA 1.40 03/15/2016   PSA 1.52 05/03/2014   PSA 0.92 07/27/2012   5. Colon cancer screening - needs updated 3 year colonoscopy. With change in job- he wants to hold off 6. A few skin lesions unchanged- has seen dermatology in past- no changes since then. Declines dermatology follow up at present given no changes  Status of chronic or acute concerns   5 weeks ago job loss- tough on weight loss.   HTN- acycylovir prn for recurrent cold sores  Hypothyroid Hypothyroid- slightly poorly controlled on 88 mcg. Discussed100 mcg dose. Agreed and repeat tsh 6-8  weeks  Hyperlipidemia Elevated risk. Advised alternate statin. Firmly declines for primary prevention  Essential hypertension on amlodipine-olmesartan 10-40mg , metoprolol 50mg  XL. Avoid hctz with gout BP Readings from Last 3 Encounters:  03/22/16 (!) 146/84  05/01/15 (!) 150/82  03/31/15 140/80  At jnc 8 goal- declines increase in medication. Wants to work on weight loss  Hyperglycemia cbg 116. Needs weight loss. Would consider a1c at follow up  6 week labs. 6 month in person advised to check on weight and BP  Orders Placed This Encounter  Procedures  . TSH    Standing Status:   Future    Standing Expiration Date:   03/22/2017    Meds ordered this encounter  Medications  . levothyroxine (SYNTHROID, LEVOTHROID) 100 MCG tablet    Sig: Take 1 tablet (100 mcg total) by mouth daily.    Dispense:  90 tablet    Refill:  3  . acyclovir (ZOVIRAX) 400 MG tablet    Sig: Take 1 tablet (400 mg total) by mouth 5 (five) times daily. For 5 days maximum for cold sores    Dispense:  50 tablet    Refill:  1  . tadalafil (CIALIS) 20 MG tablet    Sig: Take 1 tablet (20 mg total) by mouth daily as needed for erectile dysfunction.    Dispense:  10 tablet    Refill:  5  . amLODipine-olmesartan (AZOR) 10-40 MG tablet    Sig: TAKE 1 TABLET DAILY    Dispense:  90 tablet    Refill:  3  . metoprolol succinate (TOPROL-XL) 50 MG 24 hr tablet    Sig: TAKE 1 TABLET DAILY; TAKE  WITH OR IMMEDIATELY        FOLLOWING A MEAL    Dispense:  90 tablet    Refill:  3  . omeprazole (PRILOSEC) 20 MG capsule    Sig: TAKE 1 CAPSULE DAILY BEFORE BREAKFAST    Dispense:  90 capsule    Refill:  3    Return precautions advised.   [name], MD

## 2016-03-23 ENCOUNTER — Telehealth: Payer: Self-pay

## 2016-03-23 NOTE — Telephone Encounter (Signed)
Received PA request for Cialis 20 mg from [facility]. PA submitted & is pending. Key: [id number]

## 2016-03-24 NOTE — Telephone Encounter (Signed)
PA denied. Plan will only cover 6 tablets per month of Cialis 20 mg.

## 2016-03-24 NOTE — Telephone Encounter (Signed)
Please inform patient of denial. Please change rx to 6 tablets per rx with 11 refills

## 2016-03-26 ENCOUNTER — Other Ambulatory Visit: Payer: Self-pay

## 2016-03-26 MED ORDER — TADALAFIL 20 MG PO TABS: 20.0000 mg | ORAL_TABLET | Freq: Every day | ORAL | 11 refills | Status: DC | PRN

## 2016-03-26 NOTE — Telephone Encounter (Signed)
Spoke with patient who is aware of denial. I informed him we would send in the new allowed quantity. Patient verbalized understanding

## 2016-05-11 ENCOUNTER — Other Ambulatory Visit: Payer: BLUE CROSS/BLUE SHIELD

## 2016-06-14 ENCOUNTER — Other Ambulatory Visit (INDEPENDENT_AMBULATORY_CARE_PROVIDER_SITE_OTHER): Payer: BLUE CROSS/BLUE SHIELD

## 2016-06-14 DIAGNOSIS — E039 Hypothyroidism, unspecified: Secondary | ICD-10-CM | POA: Diagnosis not present

## 2016-06-14 LAB — TSH: TSH: 8.43 u[IU]/mL — ABNORMAL HIGH (ref 0.35–4.50)

## 2016-06-30 ENCOUNTER — Other Ambulatory Visit: Payer: BLUE CROSS/BLUE SHIELD

## 2016-07-02 ENCOUNTER — Other Ambulatory Visit (INDEPENDENT_AMBULATORY_CARE_PROVIDER_SITE_OTHER): Payer: BLUE CROSS/BLUE SHIELD

## 2016-07-02 ENCOUNTER — Other Ambulatory Visit: Payer: Self-pay | Admitting: Family Medicine

## 2016-07-02 DIAGNOSIS — E039 Hypothyroidism, unspecified: Secondary | ICD-10-CM | POA: Diagnosis not present

## 2016-07-02 LAB — TSH: TSH: 5.61 u[IU]/mL — ABNORMAL HIGH (ref 0.35–4.50)

## 2016-07-02 MED ORDER — LEVOTHYROXINE SODIUM 112 MCG PO TABS: 112.0000 ug | ORAL_TABLET | Freq: Every day | ORAL | 5 refills | Status: DC

## 2016-07-06 ENCOUNTER — Other Ambulatory Visit: Payer: Self-pay

## 2016-07-06 DIAGNOSIS — E039 Hypothyroidism, unspecified: Secondary | ICD-10-CM

## 2016-07-06 MED ORDER — LEVOTHYROXINE SODIUM 112 MCG PO TABS: 112.0000 ug | ORAL_TABLET | Freq: Every day | ORAL | 1 refills | Status: DC

## 2016-07-11 IMAGING — CT CT ABD-PELV W/ CM
2 of 5 series · 15 of 46 positions shown, 17 images · IV contrast (ISOVUE 300)
Comparison: None.

CLINICAL DATA: Right-sided abdominal pain for approximately 8 weeks
that developed after being diagnosed with shingles. Nontender.
Patient feels it is enlarging. Skin marker placed at site.

EXAM:
CT ABDOMEN AND PELVIS WITH CONTRAST
TECHNIQUE: Multidetector CT imaging of the abdomen and pelvis was performed
using the standard protocol following bolus administration of
intravenous contrast.
CONTRAST:  100mL YX9UBZ-TZZ IOPAMIDOL (YX9UBZ-TZZ) INJECTION 61%

[Series 2: abd/ pelvis · axial · 0.94mm/px · z∈[-552,-137]mm · 12 of 95 slices shown, 14 images]
[im 6/95  soft-tissue]
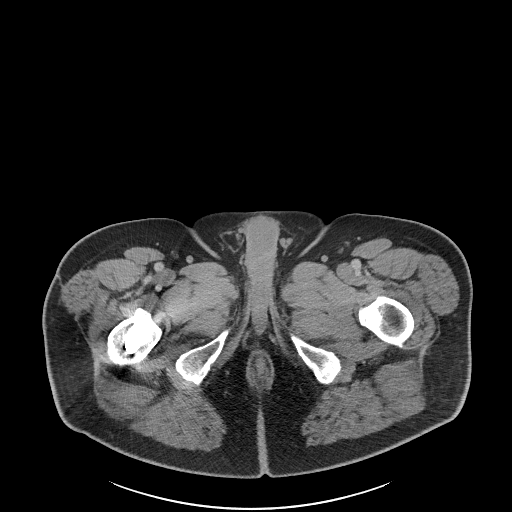
[im 6/95  bone]
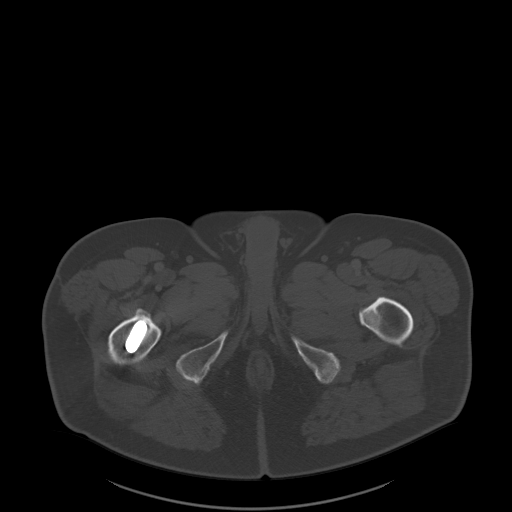
[im 16/95  soft-tissue]
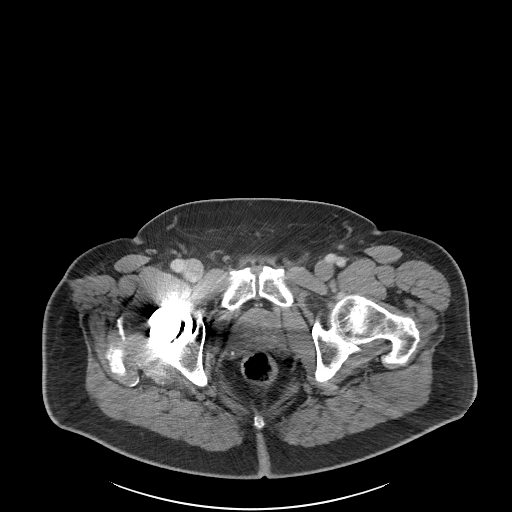
[im 21/95  soft-tissue]
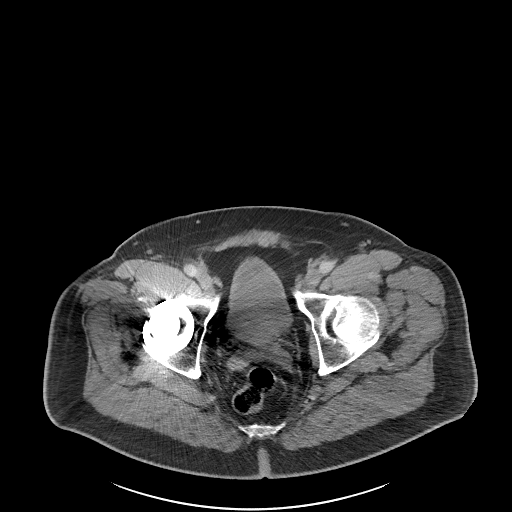
[im 27/95  soft-tissue]
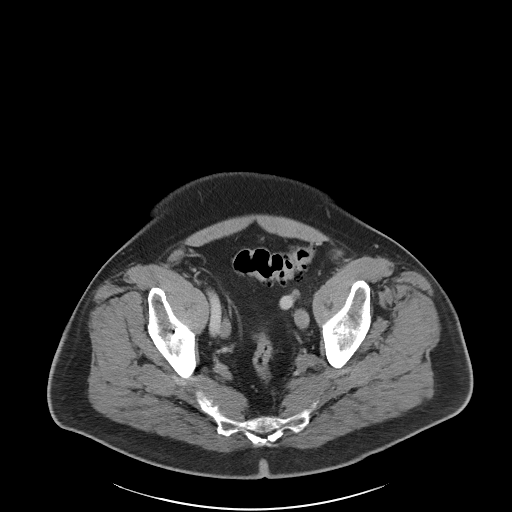
[im 37/95  soft-tissue]
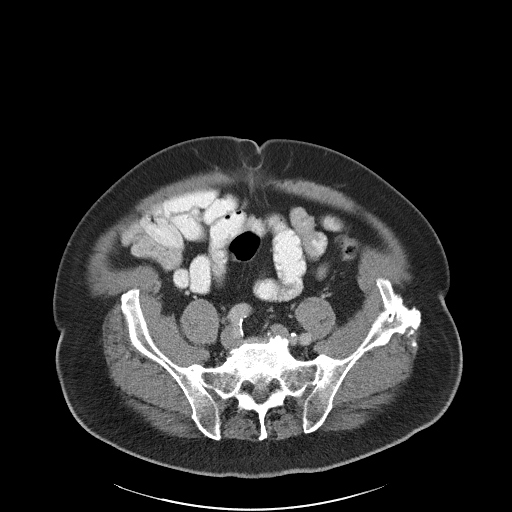
[im 42/95  soft-tissue]
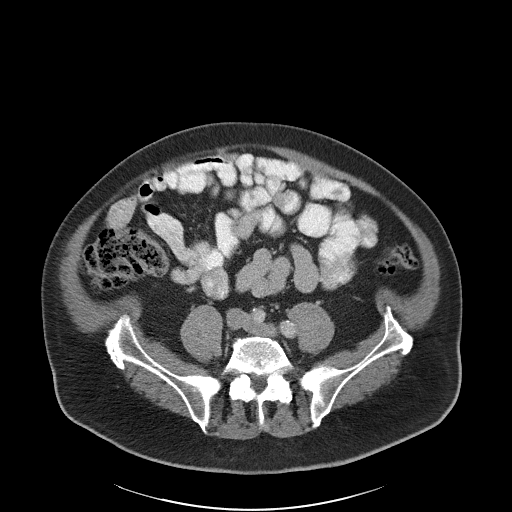
[im 53/95  soft-tissue]
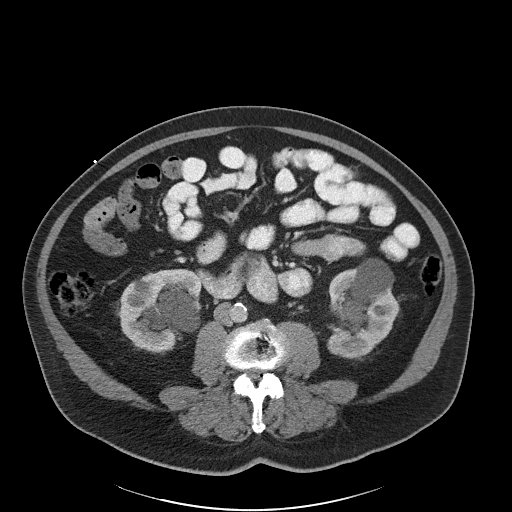
[im 58/95  soft-tissue]
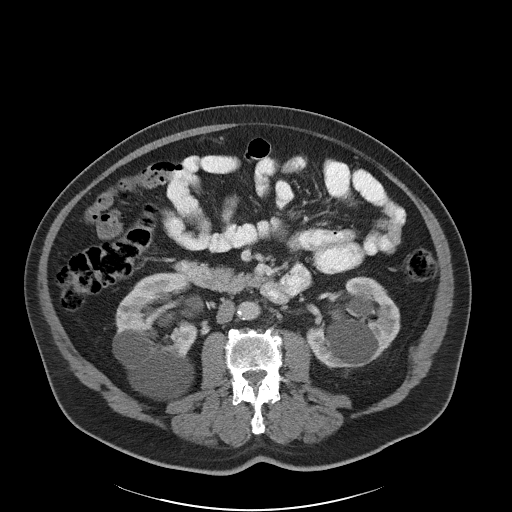
[im 68/95  soft-tissue]
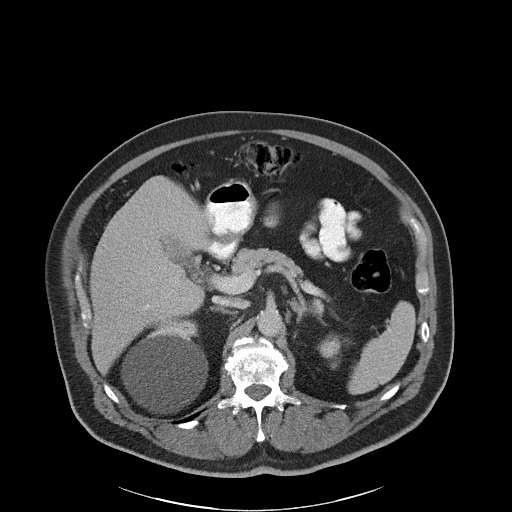
[im 68/95  bone]
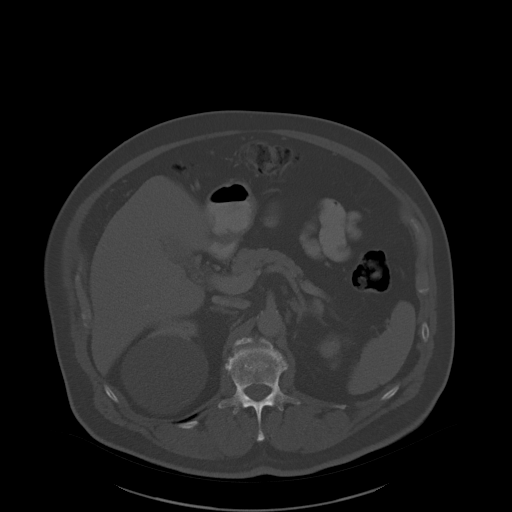
[im 74/95  soft-tissue]
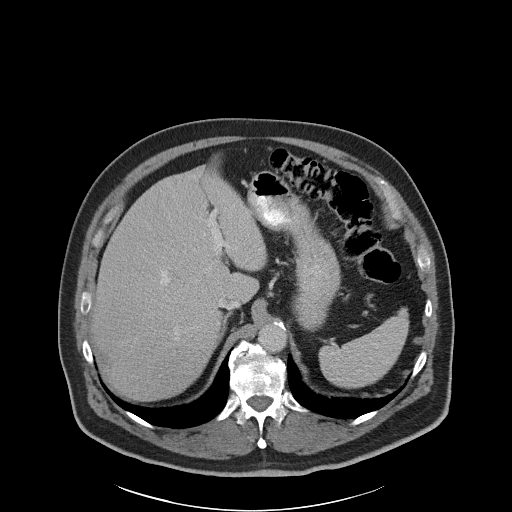
[im 79/95  soft-tissue]
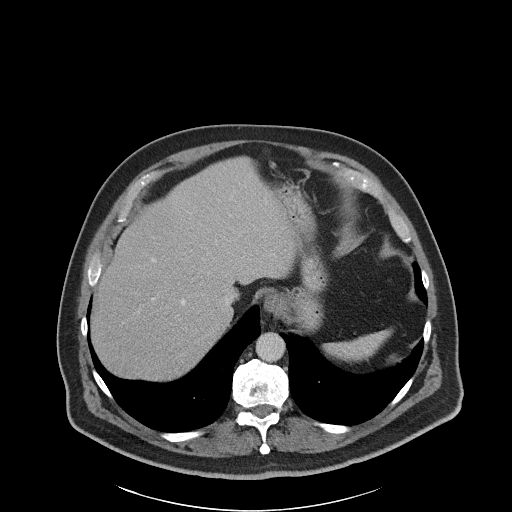
[im 89/95  soft-tissue]
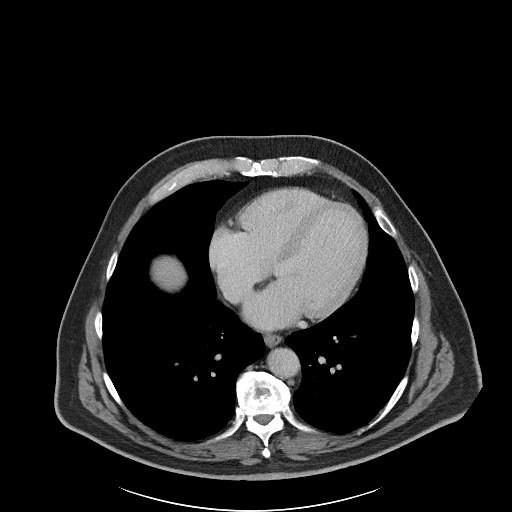

[Series 5: coronal soft tissue · coronal · 0.83mm/px · 3 of 109 slices shown]
[im 37/109  soft-tissue]
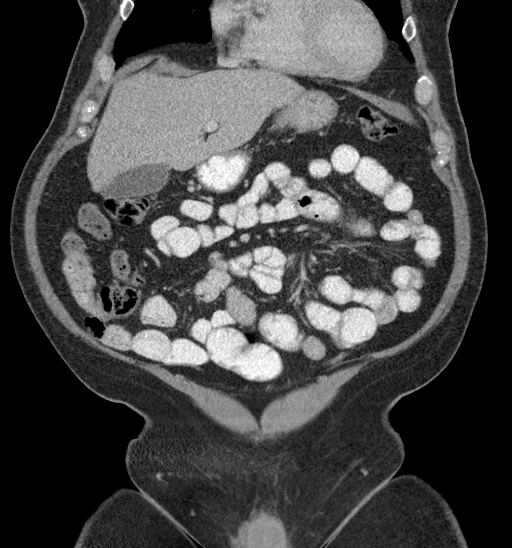
[im 49/109  soft-tissue]
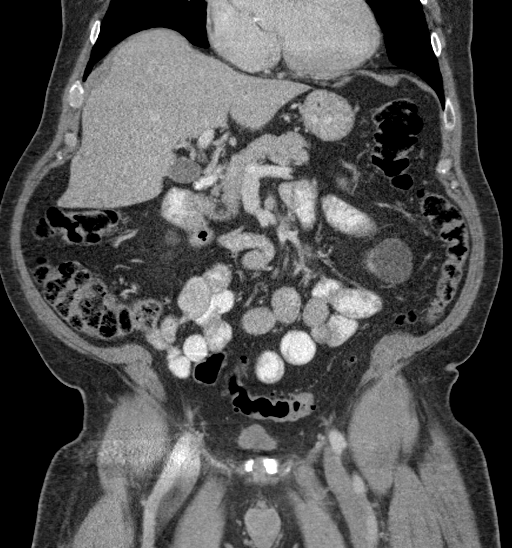
[im 61/109  soft-tissue]
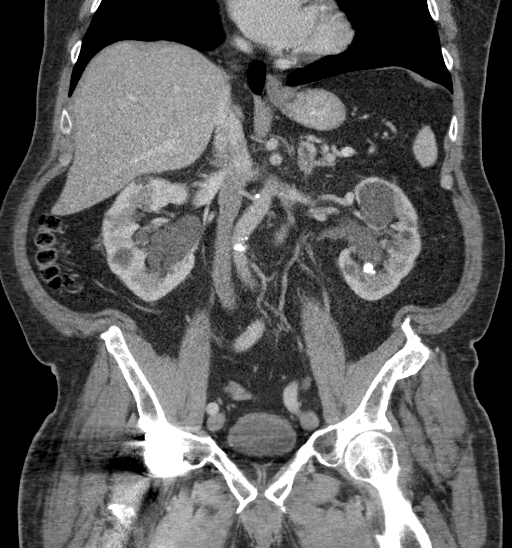

[15 of 46 positions shown; findings below may reference images not displayed]

FINDINGS: Lower chest:  No acute findings.

Hepatobiliary: No masses or other significant abnormality.

Pancreas: No mass, inflammatory changes, or other significant
abnormality.

Spleen: Within normal limits in size and appearance.

Adrenals/Urinary Tract: Adrenal glands appear normal. Numerous
bilateral renal cysts, largest on the right measuring 9 cm greatest
dimension. 10 mm left renal stone. No hydronephrosis bilaterally. No
ureteral calculi bilaterally. Bladder is unremarkable, partially
decompressed. Prostate gland moderately enlarged causing slight mass
effect on the bladder base.

Stomach/Bowel: Bowel is normal in caliber. Scattered diverticulosis
within the descending and sigmoid colon but no inflammatory change
to suggest acute diverticulitis. No bowel wall thickening or
evidence of bowel wall inflammation. Stomach appears normal.

Vascular/Lymphatic: Scattered atherosclerotic changes of the normal-
caliber abdominal aorta. No acute- appearing vascular abnormality.

Reproductive: Prostate gland is moderately enlarged causing slight
mass effect on the bladder base. Otherwise unremarkable.

Other: No free fluid or abscess collections seen. No free
intraperitoneal air.

Musculoskeletal: Right hip replacement hardware in place. No acute
or suspicious osseous lesion. Fairly extensive degenerative change
throughout the thoracolumbar spine, at least moderate in degree.
Subcutaneous soft tissues are unremarkable.

There is no soft tissue abnormality seen on the right side at the
site of the skin marker. No soft tissue mass, fluid collection or
inflammatory change within the superficial soft tissues. Underlying
musculature appears normal. No intra-abdominal mass or fluid
collection.
IMPRESSION: 1. No acute findings. There is no soft tissue abnormality seen on
the patient's right side at the site of the skin marker. No soft
tissue mass, fluid collection or inflammatory change within the
superficial soft tissues. Underlying musculature appears normal. No
intra-abdominal mass or fluid collection. There are nondistended
small bowel loops, containing some feculent-like material,
insinuating between the anterior abdominal wall and the right colon
which could be a source of a palpable abnormality.
2. Numerous bilateral renal cysts. Nonobstructing 10 mm left renal
stone.
3. Moderate prostate gland enlargement causing slight mass effect on
the bladder base.
4. Colonic diverticulosis without evidence of acute diverticulitis.
5. Degenerative changes throughout the thoracolumbar spine, at least
moderate in degree. Associated central canal stenoses within the
lumbar spine with possible associated nerve root impingements. Any
corresponding radiculopathy?

## 2016-08-24 ENCOUNTER — Other Ambulatory Visit: Payer: BLUE CROSS/BLUE SHIELD

## 2016-08-26 ENCOUNTER — Other Ambulatory Visit (INDEPENDENT_AMBULATORY_CARE_PROVIDER_SITE_OTHER): Payer: Medicare Other

## 2016-08-26 DIAGNOSIS — E039 Hypothyroidism, unspecified: Secondary | ICD-10-CM | POA: Diagnosis not present

## 2016-08-26 LAB — TSH: TSH: 4.76 u[IU]/mL — ABNORMAL HIGH (ref 0.35–4.50)

## 2016-08-27 ENCOUNTER — Other Ambulatory Visit: Payer: Self-pay

## 2016-08-27 DIAGNOSIS — E039 Hypothyroidism, unspecified: Secondary | ICD-10-CM

## 2016-08-27 MED ORDER — LEVOTHYROXINE SODIUM 125 MCG PO TABS: 125.0000 ug | ORAL_TABLET | Freq: Every day | ORAL | 3 refills | Status: DC

## 2016-09-16 DIAGNOSIS — M9903 Segmental and somatic dysfunction of lumbar region: Secondary | ICD-10-CM | POA: Diagnosis not present

## 2016-09-16 DIAGNOSIS — M5136 Other intervertebral disc degeneration, lumbar region: Secondary | ICD-10-CM | POA: Diagnosis not present

## 2016-09-16 DIAGNOSIS — M79672 Pain in left foot: Secondary | ICD-10-CM | POA: Diagnosis not present

## 2016-09-16 DIAGNOSIS — M9906 Segmental and somatic dysfunction of lower extremity: Secondary | ICD-10-CM | POA: Diagnosis not present

## 2016-10-11 ENCOUNTER — Other Ambulatory Visit (INDEPENDENT_AMBULATORY_CARE_PROVIDER_SITE_OTHER): Payer: Medicare Other

## 2016-10-11 DIAGNOSIS — E039 Hypothyroidism, unspecified: Secondary | ICD-10-CM

## 2016-10-11 LAB — TSH: TSH: 3.6 u[IU]/mL (ref 0.35–4.50)

## 2016-10-19 ENCOUNTER — Telehealth: Payer: Self-pay | Admitting: Family Medicine

## 2016-10-19 NOTE — Telephone Encounter (Signed)
° ° ° °  Pt is now using [facility] due to to insurance. He will need all his current  meds sent to his new pharmacy.

## 2016-10-20 ENCOUNTER — Other Ambulatory Visit: Payer: Self-pay

## 2016-10-20 MED ORDER — OMEPRAZOLE 20 MG PO CPDR: DELAYED_RELEASE_CAPSULE | ORAL | 3 refills | Status: DC

## 2016-10-20 MED ORDER — AMLODIPINE-OLMESARTAN 10-40 MG PO TABS: ORAL_TABLET | ORAL | 3 refills | Status: DC

## 2016-10-20 MED ORDER — LEVOTHYROXINE SODIUM 125 MCG PO TABS: 125.0000 ug | ORAL_TABLET | Freq: Every day | ORAL | 3 refills | Status: DC

## 2016-10-20 MED ORDER — METOPROLOL SUCCINATE ER 50 MG PO TB24: ORAL_TABLET | ORAL | 3 refills | Status: DC

## 2016-10-20 NOTE — Telephone Encounter (Signed)
Rx's have been sent per faxed request.

## 2016-10-30 DIAGNOSIS — M9906 Segmental and somatic dysfunction of lower extremity: Secondary | ICD-10-CM | POA: Diagnosis not present

## 2016-10-30 DIAGNOSIS — M9903 Segmental and somatic dysfunction of lumbar region: Secondary | ICD-10-CM | POA: Diagnosis not present

## 2016-10-30 DIAGNOSIS — M5136 Other intervertebral disc degeneration, lumbar region: Secondary | ICD-10-CM | POA: Diagnosis not present

## 2016-10-30 DIAGNOSIS — M79672 Pain in left foot: Secondary | ICD-10-CM | POA: Diagnosis not present

## 2016-12-21 ENCOUNTER — Telehealth: Payer: Self-pay | Admitting: Family Medicine

## 2016-12-21 NOTE — Telephone Encounter (Signed)
olmesartan is an ARB not ace-I  Losartan can actually lower uric acid and the SE listed was gout so could be consideration if having flares but wouldn't change now

## 2016-12-21 NOTE — Telephone Encounter (Signed)
Rollie with [facility] called in reference to Rx for amLODipine-olmesartan (AZOR) 10-40 MG tablet. Pharmacy stated this Rx has ACE inhibitors which patient has an allergy to. [facility] would like to know if Dr. Yong Channel would like to continue with this medication. Please call [facility] and advise at [phone] ref # [id number].

## 2016-12-22 NOTE — Telephone Encounter (Signed)
Called and spoke to pharmacist Brad who verbalized understanding and stated they will be sending out the medication. The patient is new to their pharmacy

## 2016-12-28 NOTE — Telephone Encounter (Signed)
Patient received a letter from [facility] stating they are unable to process Rx for amLODipine-olmesartan (AZOR) 10-40 MG tablet. Patient's letter stated for Dr. Yong Channel to contact pharmacy at [phone]. Please advise.

## 2017-01-01 DIAGNOSIS — M79672 Pain in left foot: Secondary | ICD-10-CM | POA: Diagnosis not present

## 2017-01-01 DIAGNOSIS — M9906 Segmental and somatic dysfunction of lower extremity: Secondary | ICD-10-CM | POA: Diagnosis not present

## 2017-01-01 DIAGNOSIS — M9903 Segmental and somatic dysfunction of lumbar region: Secondary | ICD-10-CM | POA: Diagnosis not present

## 2017-01-01 DIAGNOSIS — M5136 Other intervertebral disc degeneration, lumbar region: Secondary | ICD-10-CM | POA: Diagnosis not present

## 2017-03-01 DIAGNOSIS — Z85828 Personal history of other malignant neoplasm of skin: Secondary | ICD-10-CM | POA: Diagnosis not present

## 2017-03-01 DIAGNOSIS — L570 Actinic keratosis: Secondary | ICD-10-CM | POA: Diagnosis not present

## 2017-03-01 DIAGNOSIS — M7130 Other bursal cyst, unspecified site: Secondary | ICD-10-CM | POA: Diagnosis not present

## 2017-03-01 DIAGNOSIS — C44311 Basal cell carcinoma of skin of nose: Secondary | ICD-10-CM | POA: Diagnosis not present

## 2017-03-05 DIAGNOSIS — M9906 Segmental and somatic dysfunction of lower extremity: Secondary | ICD-10-CM | POA: Diagnosis not present

## 2017-03-05 DIAGNOSIS — M79672 Pain in left foot: Secondary | ICD-10-CM | POA: Diagnosis not present

## 2017-03-05 DIAGNOSIS — M9903 Segmental and somatic dysfunction of lumbar region: Secondary | ICD-10-CM | POA: Diagnosis not present

## 2017-03-05 DIAGNOSIS — M5136 Other intervertebral disc degeneration, lumbar region: Secondary | ICD-10-CM | POA: Diagnosis not present

## 2017-03-21 DIAGNOSIS — C44311 Basal cell carcinoma of skin of nose: Secondary | ICD-10-CM | POA: Diagnosis not present

## 2017-03-24 ENCOUNTER — Ambulatory Visit: Payer: Medicare Other | Admitting: Family Medicine

## 2017-03-24 ENCOUNTER — Ambulatory Visit (INDEPENDENT_AMBULATORY_CARE_PROVIDER_SITE_OTHER): Payer: Medicare Other | Admitting: Family Medicine

## 2017-03-24 ENCOUNTER — Encounter: Payer: Self-pay | Admitting: Family Medicine

## 2017-03-24 VITALS — BP 140/82 | HR 63 | Temp 98.7°F | Ht 69.25 in | Wt 242.8 lb

## 2017-03-24 DIAGNOSIS — R0981 Nasal congestion: Secondary | ICD-10-CM

## 2017-03-24 DIAGNOSIS — Z20828 Contact with and (suspected) exposure to other viral communicable diseases: Secondary | ICD-10-CM

## 2017-03-24 DIAGNOSIS — R52 Pain, unspecified: Secondary | ICD-10-CM | POA: Diagnosis not present

## 2017-03-24 DIAGNOSIS — R05 Cough: Secondary | ICD-10-CM | POA: Diagnosis not present

## 2017-03-24 DIAGNOSIS — R059 Cough, unspecified: Secondary | ICD-10-CM

## 2017-03-24 LAB — POCT INFLUENZA A/B
INFLUENZA B, POC: NEGATIVE
Influenza A, POC: NEGATIVE

## 2017-03-24 MED ORDER — AZITHROMYCIN 250 MG PO TABS: ORAL_TABLET | ORAL | 0 refills | Status: DC

## 2017-03-24 MED ORDER — IPRATROPIUM BROMIDE 0.06 % NA SOLN: 2.0000 | Freq: Four times a day (QID) | NASAL | 0 refills | Status: DC

## 2017-03-24 MED ORDER — BENZONATATE 200 MG PO CAPS: 200.0000 mg | ORAL_CAPSULE | Freq: Two times a day (BID) | ORAL | 0 refills | Status: DC | PRN

## 2017-03-24 NOTE — Patient Instructions (Signed)
Start the atrovent.  Start tessalon for your cough.  Start the zpack if your symptoms worsen or do not improve in a few days.  Please stay well hydrated.  You can take tylenol and/or motrin as needed for low grade fever and pain.  Please let me know if your symptoms worsen or fail to improve.  Take care, Dr Miia Blanks  

## 2017-03-24 NOTE — Progress Notes (Signed)
   Subjective:  [name] is a 72 y.o. male who presents today for same-day appointment with a chief complaint of cough.   HPI:  Cough, Acute Issue Started about 2 days ago. Worsened over that time. Associated with rhinorrhea, myalgias, and body aches. No fevers. Several family have been diagnosed with flu. Did not get the flu shot this year. Takes aleve for chronic knee pain which has helped some. No other obvious alleviating or aggravating factors.   ROS: Per HPI  PMH: He reports that  has never smoked. he has never used smokeless tobacco. He reports that he drinks about 5.0 oz of alcohol per week. He reports that he does not use drugs.  Objective:  Physical Exam: BP 140/82 (BP Location: Left Arm, Patient Position: Sitting, Cuff Size: Normal)   Pulse 63   Temp 98.7 F (37.1 C) (Oral)   Ht 5' 9.25" (1.759 m)   Wt 242 lb 12.8 oz (110.1 kg)   SpO2 94%   BMI 35.60 kg/m   Gen: NAD, resting comfortably HEENT: OP erythematous without exudate. No LAD.  CV: RRR with no murmurs appreciated Pulm: NWOB, CTAB with no crackles, wheezes, or rhonchi  Results for orders placed or performed in visit on 03/24/17 (from the past 24 hour(s))  POCT Influenza A/B     Status: None   Collection Time: 03/24/17  8:37 AM  Result Value Ref Range   Influenza A, POC Negative Negative   Influenza B, POC Negative Negative    Assessment/Plan:  Cough, Exposure to Flu Likely secondary to viral URI. No signs of bacterial infection. He does have flu exposure - deferred flu vaccine and tamiflu today. Encouraged good hand hygiene to prevent infection. Start atrovent for rhinorrhea/sinus congestion.  Start tessalon for cough. Sent in a "pocket prescription" for azithromycin with strict instruction to not start unless symptoms worse or fail to improve within the next several days. Recommended tylenol and/or motrin as needed for low grade fever and pain. Encouraged good oral hydration. Return precautions  reviewed. Follow up as needed.   Algis Greenhouse. Jerline Pain, MD 03/24/2017 8:38 AM

## 2017-04-22 ENCOUNTER — Other Ambulatory Visit: Payer: Self-pay

## 2017-04-22 ENCOUNTER — Telehealth: Payer: Self-pay | Admitting: Family Medicine

## 2017-04-22 MED ORDER — AMLODIPINE-OLMESARTAN 10-40 MG PO TABS: ORAL_TABLET | ORAL | 3 refills | Status: DC

## 2017-04-22 NOTE — Telephone Encounter (Signed)
Copied from [place] [id number]. Topic: Quick Communication - Rx Refill/Question >> Apr 22, 2017 11:27 AM Arletha Grippe wrote: Medication: amLODipine-olmesartan (AZOR) 10-40 MG tablet   Has the patient contacted their pharmacy? Yes.   Pt did not get this med through [facility] -he said it was missing in his order.  He has ordered it, but it will not come until march 21st  Pt is out of meds  (Agent: If no, request that the patient contact the pharmacy for the refill.)   Preferred Pharmacy (with phone number or street name): [facility] [address]  Pt is leaving to go out of town tomorrow am.     Agent: Please be advised that RX refills may take up to 3 business days. We ask that you follow-up with your pharmacy.  Sending high priority in hopes that pt can get filled before he goes out of town.

## 2017-05-06 DIAGNOSIS — M5136 Other intervertebral disc degeneration, lumbar region: Secondary | ICD-10-CM | POA: Diagnosis not present

## 2017-05-06 DIAGNOSIS — M79672 Pain in left foot: Secondary | ICD-10-CM | POA: Diagnosis not present

## 2017-05-06 DIAGNOSIS — M9906 Segmental and somatic dysfunction of lower extremity: Secondary | ICD-10-CM | POA: Diagnosis not present

## 2017-05-06 DIAGNOSIS — M9903 Segmental and somatic dysfunction of lumbar region: Secondary | ICD-10-CM | POA: Diagnosis not present

## 2017-05-24 ENCOUNTER — Ambulatory Visit (INDEPENDENT_AMBULATORY_CARE_PROVIDER_SITE_OTHER): Payer: Medicare Other | Admitting: Family Medicine

## 2017-05-24 ENCOUNTER — Encounter: Payer: Self-pay | Admitting: Family Medicine

## 2017-05-24 VITALS — BP 120/80 | HR 64 | Temp 98.3°F | Ht 69.25 in | Wt 244.6 lb

## 2017-05-24 DIAGNOSIS — R739 Hyperglycemia, unspecified: Secondary | ICD-10-CM | POA: Diagnosis not present

## 2017-05-24 DIAGNOSIS — Z0000 Encounter for general adult medical examination without abnormal findings: Secondary | ICD-10-CM | POA: Diagnosis not present

## 2017-05-24 DIAGNOSIS — E039 Hypothyroidism, unspecified: Secondary | ICD-10-CM | POA: Diagnosis not present

## 2017-05-24 DIAGNOSIS — E785 Hyperlipidemia, unspecified: Secondary | ICD-10-CM

## 2017-05-24 DIAGNOSIS — Z125 Encounter for screening for malignant neoplasm of prostate: Secondary | ICD-10-CM | POA: Diagnosis not present

## 2017-05-24 DIAGNOSIS — Z1211 Encounter for screening for malignant neoplasm of colon: Secondary | ICD-10-CM | POA: Diagnosis not present

## 2017-05-24 MED ORDER — TADALAFIL 20 MG PO TABS: 20.0000 mg | ORAL_TABLET | ORAL | 3 refills | Status: DC | PRN

## 2017-05-24 NOTE — Assessment & Plan Note (Signed)
BMI >35 with hypertension and hyperlipidemia. Advised on importance of weight loss- he is trying to get back to gym and improve diet just in last week or so- encouraged him to continue

## 2017-05-24 NOTE — Patient Instructions (Addendum)
Health Maintenance Due  Topic Date Due  . COLONOSCOPY -referral placed 08/04/2015   [email or web address]- please call them for delivery of your generic cialis  Would get shingrix at your pharmacy if you are able to find it  Please stop by lab before you go

## 2017-05-24 NOTE — Progress Notes (Addendum)
Phone: [phone]  Subjective:  Patient presents today for their annual physical. Chief complaint-noted.   See problem oriented charting- ROS- full  review of systems was completed and negative except for: trouble with erections, occasional cold sores  The following were reviewed and entered/updated in epic: Past Medical History:  Diagnosis Date  . Abnormal mammogram 04/03/2008   L side- lipitor related, resolved off medication    . Arthritis   . Benign fundic gland polyps of stomach   . Cancer ([place])    jaw removal due to cancer- 40 years ago   . GERD (gastroesophageal reflux disease)   . GI bleed 2005   after colonoscopy  . Gout   . HTN (hypertension)   . Hyperlipidemia   . Hypothyroidism   . Irregular bowel habits    occ. irregular bowel elimination  . Personal history of rectal adenoma with high-grade dysplasia and colonic adenomas    Patient Active Problem List   Diagnosis Date Noted  . Recurrent cold sores 01/07/2015    Priority: Medium  . Hypothyroid 04/28/2011    Priority: Medium  . Hyperlipidemia 12/09/2006    Priority: Medium  . Essential hypertension 08/18/2006    Priority: Medium  . Erectile dysfunction 01/07/2015    Priority: Low  . Knee osteoarthritis 04/25/2014    Priority: Low  . GERD (gastroesophageal reflux disease) 08/03/2012    Priority: Low  . Osteoarthritis 02/27/2010    Priority: Low  . Gout 08/18/2006    Priority: Low  . Personal history of rectal adenoma with high-grade dysplasia 08/18/2006    Priority: Low  . Morbid obesity ([place]) 05/24/2017  . Hyperglycemia 03/22/2016  . S/P right THA, AA 10/08/2014   Past Surgical History:  Procedure Laterality Date  . COLONOSCOPY    . JOINT REPLACEMENT Right 09/2013   left knee0 09/2013, right knee- 2011   . KNEE ARTHROSCOPY  2011   Dr. Tonita Cong  . KNEE ARTHROSCOPY WITH MEDIAL MENISECTOMY Left 06/29/2013   Procedure: LEFT KNEE ARTHROSCOPY WITH DEBRIDEMENT AND A PARTIAL MEDIAL MENISECTOMY;  Surgeon:  Johnn Hai, MD;  Location: [facility];  Service: Orthopedics;  Laterality: Left;  . TONSILLECTOMY    . TOTAL HIP ARTHROPLASTY Right 10/08/2014   Procedure: RIGHT TOTAL HIP ARTHROPLASTY ANTERIOR APPROACH;  Surgeon: Paralee Cancel, MD;  Location: [facility];  Service: Orthopedics;  Laterality: Right;  . TOTAL KNEE ARTHROPLASTY  6/11, 09/2013   Dr. Tonita Cong  . TOTAL KNEE ARTHROPLASTY Left 09/28/2013   Procedure: LEFT TOTAL KNEE ARTHROPLASTY;  Surgeon: Johnn Hai, MD;  Location: [facility];  Service: Orthopedics;  Laterality: Left;  . TUMOR REMOVAL  1984   from [place] from hip in jaw  . UPPER GASTROINTESTINAL ENDOSCOPY      Family History  Problem Relation Age of Onset  . Stroke Mother   . Diabetes Mother   . Colon cancer Neg Hx     Medications- reviewed and updated Current Outpatient Medications  Medication Sig Dispense Refill  . acyclovir (ZOVIRAX) 400 MG tablet Take 1 tablet (400 mg total) by mouth 5 (five) times daily. For 5 days maximum for cold sores 50 tablet 1  . amLODipine-olmesartan (AZOR) 10-40 MG tablet TAKE 1 TABLET DAILY 90 tablet 3  . glucosamine-chondroitin 500-400 MG tablet Take 2 tablets by mouth daily.    Marland Kitchen levothyroxine (SYNTHROID, LEVOTHROID) 125 MCG tablet Take 1 tablet (125 mcg total) by mouth daily. 90 tablet 3  . metoprolol succinate (TOPROL-XL) 50 MG 24 hr tablet TAKE  1 TABLET DAILY; TAKE  WITH OR IMMEDIATELY        FOLLOWING A MEAL 90 tablet 3  . Multiple Vitamin (MULTIVITAMIN WITH MINERALS) TABS tablet Take 1 tablet by mouth daily.    . NON FORMULARY Take 2 capsules by mouth daily. Vitamin to help lower cholesterol    . omeprazole (PRILOSEC) 20 MG capsule TAKE 1 CAPSULE DAILY BEFORE BREAKFAST 90 capsule 3  . Probiotic Product (PROBIOTIC DAILY PO) Take 1 tablet by mouth daily.    . tadalafil (CIALIS) 20 MG tablet Take 1 tablet (20 mg total) by mouth every other day as needed for erectile dysfunction. 10 tablet 3   No current  facility-administered medications for this visit.    Allergies-reviewed and updated Allergies  Allergen Reactions  . Ace Inhibitors     Gout  . Atorvastatin     REACTION: breast tenderness  . Doxycycline     REACTION: stomach cramping. flatus    Social History   Social History Narrative   Married 18 years (wife patient of Dr. Yong Channel), 1 son attorney in [place]- 2 daughts, daughter  With MS and 2 granddaughters.       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    Had worked for for [employer]. A lot of travel including international.       Hobbies: garden, cooking, beach trips   Objective: BP 120/80 (BP Location: Left Arm, Patient Position: Sitting, Cuff Size: Large)   Pulse 64   Temp 98.3 F (36.8 C) (Oral)   Ht 5' 9.25" (1.759 m)   Wt 244 lb 9.6 oz (110.9 kg)   SpO2 95%   BMI 35.86 kg/m  Gen: NAD, resting comfortably HEENT: Mucous membranes are moist. Oropharynx normal Neck: no obvious lymphadenopathy or significant thyromegaly CV: RRR no murmurs rubs or gallops Lungs: CTAB no crackles, wheeze, rhonchi Abdomen: soft/nontender/nondistended/normal bowel sounds. No rebound or guarding.  obese Ext: no edema Skin: warm, dry Neuro: grossly normal, moves all extremities, PERRLA Rectal: normal tone, diffusely enlarged prostate, no masses or tenderness  Assessment/Plan:  72 y.o. male presenting for annual physical.  Health Maintenance counseling: 1. Anticipatory guidance: Patient counseled regarding regular dental exams -q6 months, eye exams -agrees to follow up, wearing seatbelts.  2. Risk factor reduction:  Advised patient of need for regular exercise and diet rich and fruits and vegetables to reduce risk of heart attack and stroke. Exercise- just started back recently- wants to get back to 2 days a week. Diet-has some room for improvement. Weight stable over last year within 2 lbs.  3. Immunizations/screenings/ancillary studies-  discussed shingrix at pharmacy  Immunization History  Administered Date(s) Administered  . Hepatitis A 03/03/2002  . Hepatitis B 03/03/2002  . Pneumococcal Conjugate-13 04/25/2014  . Pneumococcal Polysaccharide-23 08/30/2012  . Td 03/01/2002  . Tdap 08/30/2012  4. Prostate cancer screening-  Discussed continuing vs. Stopping PSA screening- he wants to go through at least age 79. BPH on exam previously with nocturia 1-2x a night. Pretty stable today.  Lab Results  Component Value Date   PSA 1.40 03/15/2016   PSA 1.52 05/03/2014   PSA 0.92 07/27/2012   5. Colon cancer screening - due for colonoscopy - Dr. Carlean Purl recommended 3 year repeat after 2014 colonoscopy. We held off last year due to recent change in job. We referred him today.  6. Skin cancer screening- [facility] at least every other year-had recent biopsy and follow. advised regular  sunscreen use. Denies worrisome, changing, or new skin lesions.    Status of chronic or acute concerns   acycylovir prn for recurrent cold sores  Still dealing with arthritis.   Wants to trial generic cialis through [facility]. Brand name was too costly. Advised him to use half tablet to start.   Hypothyroid Lab Results  Component Value Date   TSH 3.60 10/11/2016  update TSH on 155mcg dose  Hyperlipidemia has declined statin for primary prevention. Taking an over the counter medicine for cholesterol from [place] and also on fish oil- we will update risk and advise. Breast lumps on lipitor so wants to avoid statin.   Essential hypertension on amlodipine-olmesartan 10-40mg , metoprolol 50mg  XL. Avoid hctz with gout. Much improved BP this year compared to last. Wonder if stress improvement related to work has helped with BP.  BP Readings from Last 3 Encounters:  05/24/17 120/80  03/24/17 140/82  03/22/16 (!) 146/84   Hyperglycemia cbg 116 last year- Needs weight loss. Get cbg and a1c- hasnt ate since last night.   Morbid obesity  (HCC) BMI >35 with hypertension and hyperlipidemia. Advised on importance of weight loss- he is trying to get back to gym and improve diet just in last week or so- encouraged him to continue  Ideally 6 months but he prefers to be seen yearly. May be moving to [place] Lab/Order associations: Preventative health care - Plan: CBC, Comprehensive metabolic panel, Lipid panel, PSA, Hemoglobin A1c, TSH  Screening for colon cancer - Plan: Ambulatory referral to Gastroenterology  Hyperlipidemia, unspecified hyperlipidemia type - Plan: CBC, Comprehensive metabolic panel, Lipid panel  Screening PSA (prostate specific antigen) - Plan: PSA  Hyperglycemia - Plan: Hemoglobin A1c  Hypothyroidism, unspecified type - Plan: TSH  Meds ordered this encounter  Medications  . tadalafil (CIALIS) 20 MG tablet    Sig: Take 1 tablet (20 mg total) by mouth every other day as needed for erectile dysfunction.    Dispense:  10 tablet    Refill:  3    Return precautions advised.  [name], MD

## 2017-05-25 LAB — CBC
HCT: 49.6 % (ref 39.0–52.0)
HEMOGLOBIN: 17.4 g/dL — AB (ref 13.0–17.0)
MCHC: 35.1 g/dL (ref 30.0–36.0)
MCV: 93 fl (ref 78.0–100.0)
Platelets: 225 10*3/uL (ref 150.0–400.0)
RBC: 5.33 Mil/uL (ref 4.22–5.81)
RDW: 13.5 % (ref 11.5–15.5)
WBC: 6.3 10*3/uL (ref 4.0–10.5)

## 2017-05-25 LAB — LIPID PANEL
Cholesterol: 250 mg/dL — ABNORMAL HIGH (ref 0–200)
HDL: 46.8 mg/dL (ref >39.00)
NonHDL: 202.88
Total CHOL/HDL Ratio: 5
Triglycerides: 264 mg/dL — ABNORMAL HIGH (ref 0.0–149.0)
VLDL: 52.8 mg/dL — AB (ref 0.0–40.0)

## 2017-05-25 LAB — COMPREHENSIVE METABOLIC PANEL
ALK PHOS: 72 U/L (ref 39–117)
ALT: 18 U/L (ref 0–53)
AST: 22 U/L (ref 0–37)
Albumin: 4.4 g/dL (ref 3.5–5.2)
BUN: 19 mg/dL (ref 6–23)
CO2: 25 mEq/L (ref 19–32)
Calcium: 9.2 mg/dL (ref 8.4–10.5)
Chloride: 103 mEq/L (ref 96–112)
Creatinine, Ser: 1.28 mg/dL (ref 0.40–1.50)
GFR: 58.76 mL/min — ABNORMAL LOW (ref >60.00)
GLUCOSE: 109 mg/dL — AB (ref 70–99)
POTASSIUM: 4.3 meq/L (ref 3.5–5.1)
SODIUM: 138 meq/L (ref 135–145)
TOTAL PROTEIN: 6.9 g/dL (ref 6.0–8.3)
Total Bilirubin: 0.9 mg/dL (ref 0.2–1.2)

## 2017-05-25 LAB — TSH: TSH: 4.5 u[IU]/mL (ref 0.35–4.50)

## 2017-05-25 LAB — HEMOGLOBIN A1C: HEMOGLOBIN A1C: 6.2 % (ref 4.6–6.5)

## 2017-05-25 LAB — PSA: PSA: 1.28 ng/mL (ref 0.10–4.00)

## 2017-05-25 LAB — LDL CHOLESTEROL, DIRECT: Direct LDL: 153 mg/dL

## 2017-05-26 ENCOUNTER — Other Ambulatory Visit: Payer: Self-pay

## 2017-05-26 MED ORDER — ROSUVASTATIN CALCIUM 10 MG PO TABS: 10.0000 mg | ORAL_TABLET | ORAL | 3 refills | Status: DC

## 2017-05-26 MED ORDER — ROSUVASTATIN CALCIUM 10 MG PO TABS: 10.0000 mg | ORAL_TABLET | Freq: Every day | ORAL | 3 refills | Status: DC

## 2017-07-23 DIAGNOSIS — M79672 Pain in left foot: Secondary | ICD-10-CM | POA: Diagnosis not present

## 2017-07-23 DIAGNOSIS — M9906 Segmental and somatic dysfunction of lower extremity: Secondary | ICD-10-CM | POA: Diagnosis not present

## 2017-07-23 DIAGNOSIS — M9903 Segmental and somatic dysfunction of lumbar region: Secondary | ICD-10-CM | POA: Diagnosis not present

## 2017-07-23 DIAGNOSIS — M5136 Other intervertebral disc degeneration, lumbar region: Secondary | ICD-10-CM | POA: Diagnosis not present

## 2017-07-27 ENCOUNTER — Encounter: Payer: Self-pay | Admitting: Family Medicine

## 2017-07-28 ENCOUNTER — Telehealth: Payer: Self-pay

## 2017-07-28 NOTE — Telephone Encounter (Signed)
Called and spoke with patient who stated he is in the middle of moving. I provided him with the telephone number as listed below. He stated he will call them this afternoon to schedule an appointment.  [facility] [address] [place], [place]  [address] Phone:  [phone]

## 2017-08-17 ENCOUNTER — Other Ambulatory Visit: Payer: Self-pay | Admitting: Family Medicine

## 2017-08-29 DIAGNOSIS — M9906 Segmental and somatic dysfunction of lower extremity: Secondary | ICD-10-CM | POA: Diagnosis not present

## 2017-08-29 DIAGNOSIS — M9903 Segmental and somatic dysfunction of lumbar region: Secondary | ICD-10-CM | POA: Diagnosis not present

## 2017-08-29 DIAGNOSIS — M5136 Other intervertebral disc degeneration, lumbar region: Secondary | ICD-10-CM | POA: Diagnosis not present

## 2017-08-29 DIAGNOSIS — M79672 Pain in left foot: Secondary | ICD-10-CM | POA: Diagnosis not present

## 2017-09-12 DIAGNOSIS — M5136 Other intervertebral disc degeneration, lumbar region: Secondary | ICD-10-CM | POA: Diagnosis not present

## 2017-09-12 DIAGNOSIS — M9906 Segmental and somatic dysfunction of lower extremity: Secondary | ICD-10-CM | POA: Diagnosis not present

## 2017-09-12 DIAGNOSIS — M9903 Segmental and somatic dysfunction of lumbar region: Secondary | ICD-10-CM | POA: Diagnosis not present

## 2017-09-12 DIAGNOSIS — M79672 Pain in left foot: Secondary | ICD-10-CM | POA: Diagnosis not present

## 2017-09-14 ENCOUNTER — Other Ambulatory Visit: Payer: Self-pay | Admitting: Family Medicine

## 2017-09-28 DIAGNOSIS — D225 Melanocytic nevi of trunk: Secondary | ICD-10-CM | POA: Diagnosis not present

## 2017-09-28 DIAGNOSIS — L570 Actinic keratosis: Secondary | ICD-10-CM | POA: Diagnosis not present

## 2017-09-28 DIAGNOSIS — L814 Other melanin hyperpigmentation: Secondary | ICD-10-CM | POA: Diagnosis not present

## 2017-09-28 DIAGNOSIS — C44311 Basal cell carcinoma of skin of nose: Secondary | ICD-10-CM | POA: Diagnosis not present

## 2017-09-28 DIAGNOSIS — D485 Neoplasm of uncertain behavior of skin: Secondary | ICD-10-CM | POA: Diagnosis not present

## 2017-09-28 DIAGNOSIS — C44712 Basal cell carcinoma of skin of right lower limb, including hip: Secondary | ICD-10-CM | POA: Diagnosis not present

## 2017-09-28 DIAGNOSIS — Z85828 Personal history of other malignant neoplasm of skin: Secondary | ICD-10-CM | POA: Diagnosis not present

## 2017-09-28 DIAGNOSIS — D2261 Melanocytic nevi of right upper limb, including shoulder: Secondary | ICD-10-CM | POA: Diagnosis not present

## 2017-10-06 ENCOUNTER — Other Ambulatory Visit: Payer: Self-pay | Admitting: Family Medicine

## 2017-10-06 DIAGNOSIS — R944 Abnormal results of kidney function studies: Secondary | ICD-10-CM

## 2017-10-26 DIAGNOSIS — M9906 Segmental and somatic dysfunction of lower extremity: Secondary | ICD-10-CM | POA: Diagnosis not present

## 2017-10-26 DIAGNOSIS — M9903 Segmental and somatic dysfunction of lumbar region: Secondary | ICD-10-CM | POA: Diagnosis not present

## 2017-10-26 DIAGNOSIS — M79672 Pain in left foot: Secondary | ICD-10-CM | POA: Diagnosis not present

## 2017-10-26 DIAGNOSIS — M5136 Other intervertebral disc degeneration, lumbar region: Secondary | ICD-10-CM | POA: Diagnosis not present

## 2017-11-10 DIAGNOSIS — C44311 Basal cell carcinoma of skin of nose: Secondary | ICD-10-CM | POA: Diagnosis not present

## 2017-11-17 DIAGNOSIS — Z4802 Encounter for removal of sutures: Secondary | ICD-10-CM | POA: Diagnosis not present

## 2017-12-05 DIAGNOSIS — M9906 Segmental and somatic dysfunction of lower extremity: Secondary | ICD-10-CM | POA: Diagnosis not present

## 2017-12-05 DIAGNOSIS — M79672 Pain in left foot: Secondary | ICD-10-CM | POA: Diagnosis not present

## 2017-12-05 DIAGNOSIS — M9903 Segmental and somatic dysfunction of lumbar region: Secondary | ICD-10-CM | POA: Diagnosis not present

## 2017-12-05 DIAGNOSIS — M5136 Other intervertebral disc degeneration, lumbar region: Secondary | ICD-10-CM | POA: Diagnosis not present

## 2017-12-30 DIAGNOSIS — M79672 Pain in left foot: Secondary | ICD-10-CM | POA: Diagnosis not present

## 2017-12-30 DIAGNOSIS — M9906 Segmental and somatic dysfunction of lower extremity: Secondary | ICD-10-CM | POA: Diagnosis not present

## 2017-12-30 DIAGNOSIS — M9903 Segmental and somatic dysfunction of lumbar region: Secondary | ICD-10-CM | POA: Diagnosis not present

## 2017-12-30 DIAGNOSIS — M5136 Other intervertebral disc degeneration, lumbar region: Secondary | ICD-10-CM | POA: Diagnosis not present

## 2018-01-03 ENCOUNTER — Other Ambulatory Visit (INDEPENDENT_AMBULATORY_CARE_PROVIDER_SITE_OTHER): Payer: Medicare Other

## 2018-01-03 DIAGNOSIS — R944 Abnormal results of kidney function studies: Secondary | ICD-10-CM | POA: Diagnosis not present

## 2018-01-03 LAB — BASIC METABOLIC PANEL
BUN: 17 mg/dL (ref 6–23)
CALCIUM: 8.8 mg/dL (ref 8.4–10.5)
CHLORIDE: 101 meq/L (ref 96–112)
CO2: 29 meq/L (ref 19–32)
Creatinine, Ser: 1.07 mg/dL (ref 0.40–1.50)
GFR: 72.13 mL/min (ref >60.00)
GLUCOSE: 119 mg/dL — AB (ref 70–99)
POTASSIUM: 4.1 meq/L (ref 3.5–5.1)
SODIUM: 138 meq/L (ref 135–145)

## 2018-01-19 ENCOUNTER — Telehealth: Payer: Self-pay | Admitting: Family Medicine

## 2018-01-19 NOTE — Telephone Encounter (Signed)
Copied from [place] [id number]. Topic: General - Inquiry >> Jan 19, 2018  2:38 PM Conception Chancy, NT wrote: Reason for CRM: patient is calling and states a his insurance sent out a agency to get his urine checked and they called and states his protein was high and that he needed to come and see his pcp for another urine sample. Can a order be placed? Please contact patient.

## 2018-01-20 NOTE — Telephone Encounter (Signed)
Called and scheduled patient to see Dr Yong Channel. He did not want to wait two months. He states his last urine was 01/03/18 so he was scheduled for 02/02/18. Patient will call office if anything changes and needs to be seen sooner.

## 2018-01-20 NOTE — Telephone Encounter (Signed)
Lets have him in for a visit sometime in the next month or 2- and notes make sure to include that he needs a urine sample and reference this phone call-if he gets to feeling poorly or notes a lot of swelling in his legs more than normal-need to see him more immediately

## 2018-02-02 ENCOUNTER — Encounter: Payer: Self-pay | Admitting: Family Medicine

## 2018-02-02 ENCOUNTER — Ambulatory Visit (INDEPENDENT_AMBULATORY_CARE_PROVIDER_SITE_OTHER): Payer: Medicare Other | Admitting: Family Medicine

## 2018-02-02 VITALS — BP 138/88 | HR 51 | Temp 97.8°F | Ht 69.25 in | Wt 236.4 lb

## 2018-02-02 DIAGNOSIS — R809 Proteinuria, unspecified: Secondary | ICD-10-CM | POA: Diagnosis not present

## 2018-02-02 DIAGNOSIS — I10 Essential (primary) hypertension: Secondary | ICD-10-CM | POA: Diagnosis not present

## 2018-02-02 LAB — POC URINALSYSI DIPSTICK (AUTOMATED)
Bilirubin, UA: NEGATIVE
GLUCOSE UA: NEGATIVE
Ketones, UA: NEGATIVE
Leukocytes, UA: NEGATIVE
NITRITE UA: NEGATIVE
Protein, UA: POSITIVE — AB
RBC UA: NEGATIVE
Spec Grav, UA: 1.02 (ref 1.010–1.025)
UROBILINOGEN UA: 0.2 U/dL
pH, UA: 6 (ref 5.0–8.0)

## 2018-02-02 LAB — COMPREHENSIVE METABOLIC PANEL
ALT: 14 U/L (ref 0–53)
AST: 17 U/L (ref 0–37)
Albumin: 4.3 g/dL (ref 3.5–5.2)
Alkaline Phosphatase: 75 U/L (ref 39–117)
BILIRUBIN TOTAL: 0.8 mg/dL (ref 0.2–1.2)
BUN: 16 mg/dL (ref 6–23)
CHLORIDE: 99 meq/L (ref 96–112)
CO2: 28 meq/L (ref 19–32)
Calcium: 9.3 mg/dL (ref 8.4–10.5)
Creatinine, Ser: 1.08 mg/dL (ref 0.40–1.50)
GFR: 71.34 mL/min (ref >60.00)
Glucose, Bld: 104 mg/dL — ABNORMAL HIGH (ref 70–99)
Potassium: 4 mEq/L (ref 3.5–5.1)
Sodium: 137 mEq/L (ref 135–145)
Total Protein: 6.7 g/dL (ref 6.0–8.3)

## 2018-02-02 LAB — MICROALBUMIN / CREATININE URINE RATIO
Creatinine,U: 140.1 mg/dL
MICROALB UR: 42.2 mg/dL — AB (ref 0.0–1.9)
Microalb Creat Ratio: 30.1 mg/g — ABNORMAL HIGH (ref 0.0–30.0)

## 2018-02-02 NOTE — Progress Notes (Signed)
Subjective:  [name] is a 72 y.o. year old very pleasant male patient who presents for/with See problem oriented charting ROS-no chest pain or shortness of breath.  No headache or blurry vision reported.  No edema.  Past Medical History-  Patient Active Problem List   Diagnosis Date Noted  . Recurrent cold sores 01/07/2015    Priority: Medium  . Hypothyroid 04/28/2011    Priority: Medium  . Hyperlipidemia 12/09/2006    Priority: Medium  . Essential hypertension 08/18/2006    Priority: Medium  . Erectile dysfunction 01/07/2015    Priority: Low  . Knee osteoarthritis 04/25/2014    Priority: Low  . GERD (gastroesophageal reflux disease) 08/03/2012    Priority: Low  . Osteoarthritis 02/27/2010    Priority: Low  . Gout 08/18/2006    Priority: Low  . Personal history of rectal adenoma with high-grade dysplasia 08/18/2006    Priority: Low  . Proteinuria 02/02/2018  . Morbid obesity ([place]) 05/24/2017  . Hyperglycemia 03/22/2016  . S/P right THA, AA 10/08/2014    Medications- reviewed and updated Current Outpatient Medications  Medication Sig Dispense Refill  . acyclovir (ZOVIRAX) 400 MG tablet Take 1 tablet (400 mg total) by mouth 5 (five) times daily. For 5 days maximum for cold sores 50 tablet 1  . amLODipine-olmesartan (AZOR) 10-40 MG tablet TAKE 1 TABLET DAILY 90 tablet 3  . glucosamine-chondroitin 500-400 MG tablet Take 2 tablets by mouth daily.    Marland Kitchen levothyroxine (SYNTHROID, LEVOTHROID) 125 MCG tablet TAKE 1 TABLET BY MOUTH  DAILY 90 tablet 3  . metoprolol succinate (TOPROL-XL) 50 MG 24 hr tablet TAKE 1 TABLET BY MOUTH  DAILY WITH OR IMMEDIATELY  FOLLOWING A MEAL 90 tablet 3  . Multiple Vitamin (MULTIVITAMIN WITH MINERALS) TABS tablet Take 1 tablet by mouth daily.    . NON FORMULARY Take 2 capsules by mouth daily. Vitamin to help lower cholesterol    . omeprazole (PRILOSEC) 20 MG capsule TAKE 1 CAPSULE BY MOUTH  DAILY BEFORE BREAKFAST 90 capsule 3  . Probiotic  Product (PROBIOTIC DAILY PO) Take 1 tablet by mouth daily.    . rosuvastatin (CRESTOR) 10 MG tablet Take 1 tablet (10 mg total) by mouth once a week. 13 tablet 3  . tadalafil (CIALIS) 20 MG tablet Take 1 tablet (20 mg total) by mouth every other day as needed for erectile dysfunction. 10 tablet 3   No current facility-administered medications for this visit.     Objective: BP 138/88 (BP Location: Left Arm, Cuff Size: Large)   Pulse (!) 51   Temp 97.8 F (36.6 C) (Oral)   Ht 5' 9.25" (1.759 m)   Wt 236 lb 6.4 oz (107.2 kg)   SpO2 95%   BMI 34.66 kg/m  Gen: NAD, resting comfortably CV: RRR no murmurs rubs or gallops Lungs: CTAB no crackles, wheeze, rhonchi Abdomen: soft/nontender/nondistended Ext: no edema Skin: warm, dry Neuro: Speech normal, moves all extremities  Results for orders placed or performed in visit on 02/02/18 (from the past 24 hour(s))  POCT Urinalysis Dipstick (Automated)     Status: Abnormal   Collection Time: 02/02/18  1:12 PM  Result Value Ref Range   Color, UA Yellow    Clarity, UA Clear    Glucose, UA Negative Negative   Bilirubin, UA Negative    Ketones, UA Negative    Spec Grav, UA 1.020 1.010 - 1.025   Blood, UA Negative    pH, UA 6.0 5.0 - 8.0  Protein, UA Positive (A) Negative   Urobilinogen, UA 0.2 0.2 or 1.0 E.U./dL   Nitrite, UA Negative    Leukocytes, UA Negative Negative   Assessment/Plan:  Other notes: 1.  Still trying to move to [facility] but having trouble selling house  Essential hypertension S: controlled on amlodipine olmesartan 10-40 mg, metoprolol 50 mg extended release. Home #s low 130s over 60s to up to 80.  BP Readings from Last 3 Encounters:  02/02/18 138/88  05/24/17 120/80  03/24/17 140/82  A/P: We discussed blood pressure goal of <140/90. Continue current meds.  Blood pressure is high normal in office and we discussed possibly increasing dose-since blood pressures look better at home he wants to maintain current  medications.  Proteinuria S: Proteinuria- patient had an insurance evaluationand did a urine test-this urine test showed protein-he is not sure of reported amount.  Previously patient had a slight decreased filtration rate from labs earlier this year which responded to improved hydration and last GFR was above 60. He states only medication at that time was on amoxicillin for dental work. Also took amoxicillin yesterday. Was not exercising before that urine or the one today. Does not drink protein drinks  A/P:Today, did note small volume of protein in the urine at 1+- considering prior GFR decrease earlier this year- will repeat cmet to also make sure albumin stable. Will get urine microalbumin to creatinine ratio.  If this is elevated-patient does work from home and could get 24-hour urine protein sometime after the holidays.  We discussed possible referral to nephrology if needed.  He wants a copy of his updated urine and blood work when available  Lab/Order associations: Essential hypertension - Plan: Comprehensive metabolic panel  Proteinuria, unspecified type - Plan: POCT Urinalysis Dipstick (Automated), Microalbumin / creatinine urine ratio  Return precautions advised.  [name], MD

## 2018-02-02 NOTE — Patient Instructions (Addendum)
Health Maintenance Due  Topic Date Due  . COLONOSCOPY-please call GI- it is time for a repeat colonoscopy-we placed referral back in April so you should not need another referral-but let us know if they request one 08/04/2015   Please stop by lab before you go - we are going to make sure kidneys are stable. Also going to do a follow up urine test to get a little better idea of actual amount of protein in urine. If you were to have a lot of protein on this test- could consider nephrology evaluation just to be on safe side or we could order the more accurate 24 hour urine test

## 2018-02-02 NOTE — Assessment & Plan Note (Addendum)
S: controlled on amlodipine olmesartan 10-40 mg, metoprolol 50 mg extended release. Home #s low 130s over 60s to up to 80.  BP Readings from Last 3 Encounters:  02/02/18 138/88  05/24/17 120/80  03/24/17 140/82  A/P: We discussed blood pressure goal of <140/90. Continue current meds.  Blood pressure is high normal in office and we discussed possibly increasing dose-since blood pressures look better at home he wants to maintain current medications.

## 2018-02-02 NOTE — Assessment & Plan Note (Signed)
S: Proteinuria- patient had an insurance evaluationand did a urine test-this urine test showed protein-he is not sure of reported amount.  Previously patient had a slight decreased filtration rate from labs earlier this year which responded to improved hydration and last GFR was above 60. He states only medication at that time was on amoxicillin for dental work. Also took amoxicillin yesterday. Was not exercising before that urine or the one today. Does not drink protein drinks  A/P:Today, did note small volume of protein in the urine at 1+- considering prior GFR decrease earlier this year- will repeat cmet to also make sure albumin stable. Will get urine microalbumin to creatinine ratio.  If this is elevated-patient does work from home and could get 24-hour urine protein sometime after the holidays.  We discussed possible referral to nephrology if needed.  He wants a copy of his updated urine and blood work when available

## 2018-02-03 NOTE — Addendum Note (Signed)
Addended by: Marin Olp on: 02/03/2018 01:36 PM   Modules accepted: Orders

## 2018-02-20 ENCOUNTER — Other Ambulatory Visit: Payer: Self-pay | Admitting: Family Medicine

## 2018-02-20 MED ORDER — METOPROLOL SUCCINATE ER 50 MG PO TB24: ORAL_TABLET | ORAL | 0 refills | Status: DC

## 2018-02-20 NOTE — Telephone Encounter (Signed)
Copied from [place] [id number]. Topic: Quick Communication - See Telephone Encounter >> Feb 20, 2018  8:27 AM Loma Boston wrote: CRM for notification. See Telephone encounter for: 02/20/18.metoprolol succinate (TOPROL-XL) 50 MG 24 hr tablet at [facility] [phone] Fax [phone] Patient says new insurance changed to non automatic call in He has got the 90 day supply coming in from [facility] but is out needs only 4 pills called in ASAP. Needs to keep BP down, upset

## 2018-02-21 ENCOUNTER — Ambulatory Visit (INDEPENDENT_AMBULATORY_CARE_PROVIDER_SITE_OTHER): Payer: Medicare Other | Admitting: Physician Assistant

## 2018-02-21 ENCOUNTER — Encounter: Payer: Self-pay | Admitting: Physician Assistant

## 2018-02-21 VITALS — BP 130/76 | HR 75 | Temp 98.3°F | Ht 69.25 in | Wt 239.2 lb

## 2018-02-21 DIAGNOSIS — J069 Acute upper respiratory infection, unspecified: Secondary | ICD-10-CM | POA: Diagnosis not present

## 2018-02-21 MED ORDER — HYDROCODONE-HOMATROPINE 5-1.5 MG/5ML PO SYRP: 5.0000 mL | ORAL_SOLUTION | Freq: Every evening | ORAL | 0 refills | Status: DC | PRN

## 2018-02-21 MED ORDER — ERYTHROMYCIN 5 MG/GM OP OINT: 1.0000 "application " | TOPICAL_OINTMENT | Freq: Every day | OPHTHALMIC | 0 refills | Status: DC

## 2018-02-21 MED ORDER — AZITHROMYCIN 250 MG PO TABS: ORAL_TABLET | ORAL | 0 refills | Status: DC

## 2018-02-21 NOTE — Patient Instructions (Addendum)
It was great to see you!  1. Start azithromycin (z-pack) 2. Use eye ointment nightly x 3 days --> may use longer if needed 3. Cough syrup as needed  Push fluids and get plenty of rest. Please return if you are not improving as expected, or if you have high fevers (>101.5) or difficulty swallowing or worsening productive cough.  Call clinic with questions.  I hope you start feeling better soon!

## 2018-02-21 NOTE — Progress Notes (Addendum)
[name] is a 73 y.o. male here for a new problem.  I acted as a Education administrator for [employer], PA-C Anselmo Pickler, LPN  History of Present Illness:   Chief Complaint  Patient presents with  . Cough    Cough  This is a new problem. Episode onset: Started last Monday. The problem has been gradually worsening. The problem occurs constantly. The cough is productive of sputum (expectorating green sputum). Associated symptoms include nasal congestion (green drainage), postnasal drip and a sore throat. Pertinent negatives include no chills, ear congestion, ear pain, fever or shortness of breath. Associated symptoms comments: Chest congestion, Head congestion and sinus pressure.. The symptoms are aggravated by lying down. He has tried OTC cough suppressant for the symptoms. The treatment provided no relief.   He also has been complaining that both of his eyes have been goopy and matted for the past few days. Denies vision changes or eye pain.  Past Medical History:  Diagnosis Date  . Abnormal mammogram 04/03/2008   L side- lipitor related, resolved off medication    . Arthritis   . Benign fundic gland polyps of stomach   . Cancer ([place])    jaw removal due to cancer- 40 years ago   . GERD (gastroesophageal reflux disease)   . GI bleed 2005   after colonoscopy  . Gout   . HTN (hypertension)   . Hyperlipidemia   . Hypothyroidism   . Irregular bowel habits    occ. irregular bowel elimination  . Personal history of rectal adenoma with high-grade dysplasia and colonic adenomas      Social History   Socioeconomic History  . Marital status: Married    Spouse name: Not on file  . Number of children: 2  . Years of education: Not on file  . Highest education level: Not on file  Occupational History    Employer: [employer]  . Financial resource strain: Not on file  . Food insecurity:    Worry: Not on file    Inability: Not on file  . Transportation  needs:    Medical: Not on file    Non-medical: Not on file  Tobacco Use  . Smoking status: Never Smoker  . Smokeless tobacco: Never Used  Substance and Sexual Activity  . Alcohol use: Yes    Alcohol/week: 10.0 standard drinks    Types: 10 Standard drinks or equivalent per week    Comment: occasional   . Drug use: No  . Sexual activity: Yes  Lifestyle  . Physical activity:    Days per week: Not on file    Minutes per session: Not on file  . Stress: Not on file  Relationships  . Social connections:    Talks on phone: Not on file    Gets together: Not on file    Attends religious service: Not on file    Active member of club or organization: Not on file    Attends meetings of clubs or organizations: Not on file    Relationship status: Not on file  . Intimate partner violence:    Fear of current or ex partner: Not on file    Emotionally abused: Not on file    Physically abused: Not on file    Forced sexual activity: Not on file  Other Topics Concern  . Not on file  Social History Narrative   Married 66 years (wife patient of Dr. Yong Channel), 1 son attorney in  [place]- 2 daughts, daughter  With MS and 2 granddaughters.       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    Had worked for for [employer]. A lot of travel including international.       Hobbies: garden, cooking, beach trips    Past Surgical History:  Procedure Laterality Date  . COLONOSCOPY    . JOINT REPLACEMENT Right 09/2013   left knee0 09/2013, right knee- 2011   . KNEE ARTHROSCOPY  2011   Dr. Tonita Cong  . KNEE ARTHROSCOPY WITH MEDIAL MENISECTOMY Left 06/29/2013   Procedure: LEFT KNEE ARTHROSCOPY WITH DEBRIDEMENT AND A PARTIAL MEDIAL MENISECTOMY;  Surgeon: Johnn Hai, MD;  Location: [facility];  Service: Orthopedics;  Laterality: Left;  . TONSILLECTOMY    . TOTAL HIP ARTHROPLASTY Right 10/08/2014   Procedure: RIGHT TOTAL HIP ARTHROPLASTY ANTERIOR APPROACH;  Surgeon:  Paralee Cancel, MD;  Location: [facility];  Service: Orthopedics;  Laterality: Right;  . TOTAL KNEE ARTHROPLASTY  6/11, 09/2013   Dr. Tonita Cong  . TOTAL KNEE ARTHROPLASTY Left 09/28/2013   Procedure: LEFT TOTAL KNEE ARTHROPLASTY;  Surgeon: Johnn Hai, MD;  Location: [facility];  Service: Orthopedics;  Laterality: Left;  . TUMOR REMOVAL  1984   from [place] from hip in jaw  . UPPER GASTROINTESTINAL ENDOSCOPY      Family History  Problem Relation Age of Onset  . Stroke Mother   . Diabetes Mother   . Colon cancer Neg Hx     Allergies  Allergen Reactions  . Ace Inhibitors     Gout  . Atorvastatin     REACTION: breast tenderness  . Doxycycline     REACTION: stomach cramping. flatus    Current Medications:   Current Outpatient Medications:  .  acyclovir (ZOVIRAX) 400 MG tablet, Take 1 tablet (400 mg total) by mouth 5 (five) times daily. For 5 days maximum for cold sores, Disp: 50 tablet, Rfl: 1 .  amLODipine-olmesartan (AZOR) 10-40 MG tablet, TAKE 1 TABLET DAILY, Disp: 90 tablet, Rfl: 3 .  glucosamine-chondroitin 500-400 MG tablet, Take 2 tablets by mouth daily., Disp: , Rfl:  .  levothyroxine (SYNTHROID, LEVOTHROID) 125 MCG tablet, TAKE 1 TABLET BY MOUTH  DAILY, Disp: 90 tablet, Rfl: 3 .  metoprolol succinate (TOPROL-XL) 50 MG 24 hr tablet, TAKE 1 TABLET BY MOUTH  DAILY WITH OR IMMEDIATELY  FOLLOWING A MEAL, Disp: 4 tablet, Rfl: 0 .  Multiple Vitamin (MULTIVITAMIN WITH MINERALS) TABS tablet, Take 1 tablet by mouth daily., Disp: , Rfl:  .  NON FORMULARY, Take 2 capsules by mouth daily. Vitamin to help lower cholesterol, Disp: , Rfl:  .  omeprazole (PRILOSEC) 20 MG capsule, TAKE 1 CAPSULE BY MOUTH  DAILY BEFORE BREAKFAST, Disp: 90 capsule, Rfl: 3 .  Probiotic Product (PROBIOTIC DAILY PO), Take 1 tablet by mouth daily., Disp: , Rfl:  .  rosuvastatin (CRESTOR) 10 MG tablet, Take 1 tablet (10 mg total) by mouth once a week., Disp: 13 tablet, Rfl: 3 .  tadalafil (CIALIS) 20  MG tablet, Take 1 tablet (20 mg total) by mouth every other day as needed for erectile dysfunction., Disp: 10 tablet, Rfl: 3 .  azithromycin (ZITHROMAX) 250 MG tablet, Take two tablets on day 1, then one time daily x 4 days, Disp: 6 tablet, Rfl: 0 .  erythromycin ophthalmic ointment, Place 1 application into both eyes at bedtime., Disp: 3.5 g, Rfl: 0 .  HYDROcodone-homatropine (HYCODAN)  5-1.5 MG/5ML syrup, Take 5 mLs by mouth at bedtime as needed for cough., Disp: 120 mL, Rfl: 0   Review of Systems:   Review of Systems  Constitutional: Negative for chills and fever.  HENT: Positive for postnasal drip and sore throat. Negative for ear pain.   Respiratory: Positive for cough. Negative for shortness of breath.     Vitals:   Vitals:   02/21/18 0913  BP: 130/76  Pulse: 75  Temp: 98.3 F (36.8 C)  TempSrc: Oral  SpO2: 95%  Weight: 239 lb 4 oz (108.5 kg)  Height: 5' 9.25" (1.759 m)     Body mass index is 35.08 kg/m.  Physical Exam:   Physical Exam Vitals signs and nursing note reviewed.  Constitutional:      General: He is not in acute distress.    Appearance: He is well-developed. He is not ill-appearing or toxic-appearing.  HENT:     Head: Normocephalic and atraumatic.     Right Ear: Ear canal and external ear normal.     Left Ear: Tympanic membrane, ear canal and external ear normal. Tympanic membrane is not erythematous, retracted or bulging.     Ears:     Comments: Unable to visualize R TM 2/2 cerumen impaction    Nose: Mucosal edema, congestion and rhinorrhea present.     Right Sinus: No maxillary sinus tenderness or frontal sinus tenderness.     Left Sinus: No maxillary sinus tenderness or frontal sinus tenderness.     Mouth/Throat:     Lips: Pink.     Mouth: Mucous membranes are moist.     Pharynx: Uvula midline. Posterior oropharyngeal erythema present.     Tonsils: Swelling: 0 on the right. 0 on the left.  Eyes:     General: Lids are normal.     Extraocular  Movements: Extraocular movements intact.     Conjunctiva/sclera:     Right eye: Right conjunctiva is injected.     Left eye: Left conjunctiva is injected.  Neck:     Trachea: Trachea normal.  Cardiovascular:     Rate and Rhythm: Normal rate and regular rhythm.     Heart sounds: Normal heart sounds, S1 normal and S2 normal.  Pulmonary:     Effort: Pulmonary effort is normal.     Breath sounds: Normal breath sounds. No decreased breath sounds, wheezing, rhonchi or rales.  Lymphadenopathy:     Cervical: No cervical adenopathy.  Skin:    General: Skin is warm and dry.  Neurological:     Mental Status: He is alert.  Psychiatric:        Speech: Speech normal.        Behavior: Behavior normal. Behavior is cooperative.       Assessment and Plan:   Legacy was seen today for cough.  Diagnoses and all orders for this visit:  Upper respiratory tract infection, unspecified type  Other orders -     azithromycin (ZITHROMAX) 250 MG tablet; Take two tablets on day 1, then one time daily x 4 days -     Discontinue: HYDROcodone-homatropine (HYCODAN) 5-1.5 MG/5ML syrup; Take 5 mLs by mouth at bedtime as needed for cough. -     erythromycin ophthalmic ointment; Place 1 application into both eyes at bedtime. -     HYDROcodone-homatropine (HYCODAN) 5-1.5 MG/5ML syrup; Take 5 mLs by mouth at bedtime as needed for cough.   No red flags on exam.  Given duration of symptoms and lack of improvement with  symptomatic care.  Will initiate azithromycin and hycodan cough syrup per orders. Discussed taking medications as prescribed. He has a little bit of conjunctivitis as well, I did prescribe e-mycin eye ointment. Reviewed return precautions including worsening fever, SOB, worsening cough or other concerns. Push fluids and rest. I recommend that patient follow-up if symptoms worsen or persist despite treatment x 7-10 days, sooner if needed.  Reviewed expectations re: course of current medical  issues. Discussed self-management of symptoms. Outlined signs and symptoms indicating need for more acute intervention. Patient verbalized understanding and all questions were answered. See orders for this visit as documented in the electronic medical record. Patient received an After-Visit Summary.  CMA or LPN served as scribe during this visit. History, Physical, and Plan performed by medical provider. The above documentation has been reviewed and is accurate and complete.   Inda Coke, PA-C

## 2018-03-15 DIAGNOSIS — M9903 Segmental and somatic dysfunction of lumbar region: Secondary | ICD-10-CM | POA: Diagnosis not present

## 2018-03-15 DIAGNOSIS — M9906 Segmental and somatic dysfunction of lower extremity: Secondary | ICD-10-CM | POA: Diagnosis not present

## 2018-03-15 DIAGNOSIS — M5136 Other intervertebral disc degeneration, lumbar region: Secondary | ICD-10-CM | POA: Diagnosis not present

## 2018-03-15 DIAGNOSIS — M79672 Pain in left foot: Secondary | ICD-10-CM | POA: Diagnosis not present

## 2018-04-04 DIAGNOSIS — M79672 Pain in left foot: Secondary | ICD-10-CM | POA: Diagnosis not present

## 2018-04-04 DIAGNOSIS — M9903 Segmental and somatic dysfunction of lumbar region: Secondary | ICD-10-CM | POA: Diagnosis not present

## 2018-04-04 DIAGNOSIS — M5136 Other intervertebral disc degeneration, lumbar region: Secondary | ICD-10-CM | POA: Diagnosis not present

## 2018-04-04 DIAGNOSIS — M9906 Segmental and somatic dysfunction of lower extremity: Secondary | ICD-10-CM | POA: Diagnosis not present

## 2018-04-28 DIAGNOSIS — M79672 Pain in left foot: Secondary | ICD-10-CM | POA: Diagnosis not present

## 2018-04-28 DIAGNOSIS — M5136 Other intervertebral disc degeneration, lumbar region: Secondary | ICD-10-CM | POA: Diagnosis not present

## 2018-04-28 DIAGNOSIS — M9903 Segmental and somatic dysfunction of lumbar region: Secondary | ICD-10-CM | POA: Diagnosis not present

## 2018-04-28 DIAGNOSIS — M9906 Segmental and somatic dysfunction of lower extremity: Secondary | ICD-10-CM | POA: Diagnosis not present

## 2018-08-07 DIAGNOSIS — M9903 Segmental and somatic dysfunction of lumbar region: Secondary | ICD-10-CM | POA: Diagnosis not present

## 2018-08-07 DIAGNOSIS — M79672 Pain in left foot: Secondary | ICD-10-CM | POA: Diagnosis not present

## 2018-08-07 DIAGNOSIS — M5136 Other intervertebral disc degeneration, lumbar region: Secondary | ICD-10-CM | POA: Diagnosis not present

## 2018-08-07 DIAGNOSIS — M9906 Segmental and somatic dysfunction of lower extremity: Secondary | ICD-10-CM | POA: Diagnosis not present

## 2018-09-07 ENCOUNTER — Other Ambulatory Visit: Payer: Self-pay | Admitting: Family Medicine

## 2018-09-08 DIAGNOSIS — M79672 Pain in left foot: Secondary | ICD-10-CM | POA: Diagnosis not present

## 2018-09-08 DIAGNOSIS — M9906 Segmental and somatic dysfunction of lower extremity: Secondary | ICD-10-CM | POA: Diagnosis not present

## 2018-09-08 DIAGNOSIS — M5136 Other intervertebral disc degeneration, lumbar region: Secondary | ICD-10-CM | POA: Diagnosis not present

## 2018-09-08 DIAGNOSIS — M9903 Segmental and somatic dysfunction of lumbar region: Secondary | ICD-10-CM | POA: Diagnosis not present

## 2018-09-18 ENCOUNTER — Other Ambulatory Visit: Payer: Self-pay | Admitting: Family Medicine

## 2018-10-12 DIAGNOSIS — M79672 Pain in left foot: Secondary | ICD-10-CM | POA: Diagnosis not present

## 2018-10-12 DIAGNOSIS — M9903 Segmental and somatic dysfunction of lumbar region: Secondary | ICD-10-CM | POA: Diagnosis not present

## 2018-10-12 DIAGNOSIS — M9906 Segmental and somatic dysfunction of lower extremity: Secondary | ICD-10-CM | POA: Diagnosis not present

## 2018-10-12 DIAGNOSIS — M5136 Other intervertebral disc degeneration, lumbar region: Secondary | ICD-10-CM | POA: Diagnosis not present

## 2018-10-13 DIAGNOSIS — M5136 Other intervertebral disc degeneration, lumbar region: Secondary | ICD-10-CM | POA: Diagnosis not present

## 2018-10-13 DIAGNOSIS — M9906 Segmental and somatic dysfunction of lower extremity: Secondary | ICD-10-CM | POA: Diagnosis not present

## 2018-10-13 DIAGNOSIS — M79672 Pain in left foot: Secondary | ICD-10-CM | POA: Diagnosis not present

## 2018-10-13 DIAGNOSIS — M9903 Segmental and somatic dysfunction of lumbar region: Secondary | ICD-10-CM | POA: Diagnosis not present

## 2018-10-14 DIAGNOSIS — M9903 Segmental and somatic dysfunction of lumbar region: Secondary | ICD-10-CM | POA: Diagnosis not present

## 2018-10-14 DIAGNOSIS — M9906 Segmental and somatic dysfunction of lower extremity: Secondary | ICD-10-CM | POA: Diagnosis not present

## 2018-10-14 DIAGNOSIS — M5136 Other intervertebral disc degeneration, lumbar region: Secondary | ICD-10-CM | POA: Diagnosis not present

## 2018-10-14 DIAGNOSIS — M79672 Pain in left foot: Secondary | ICD-10-CM | POA: Diagnosis not present

## 2018-12-02 DIAGNOSIS — M79672 Pain in left foot: Secondary | ICD-10-CM | POA: Diagnosis not present

## 2018-12-02 DIAGNOSIS — M9903 Segmental and somatic dysfunction of lumbar region: Secondary | ICD-10-CM | POA: Diagnosis not present

## 2018-12-02 DIAGNOSIS — M5136 Other intervertebral disc degeneration, lumbar region: Secondary | ICD-10-CM | POA: Diagnosis not present

## 2018-12-02 DIAGNOSIS — M9906 Segmental and somatic dysfunction of lower extremity: Secondary | ICD-10-CM | POA: Diagnosis not present

## 2018-12-09 DIAGNOSIS — M9903 Segmental and somatic dysfunction of lumbar region: Secondary | ICD-10-CM | POA: Diagnosis not present

## 2018-12-09 DIAGNOSIS — M5136 Other intervertebral disc degeneration, lumbar region: Secondary | ICD-10-CM | POA: Diagnosis not present

## 2018-12-09 DIAGNOSIS — M79672 Pain in left foot: Secondary | ICD-10-CM | POA: Diagnosis not present

## 2018-12-09 DIAGNOSIS — M9906 Segmental and somatic dysfunction of lower extremity: Secondary | ICD-10-CM | POA: Diagnosis not present

## 2018-12-13 ENCOUNTER — Other Ambulatory Visit: Payer: Self-pay | Admitting: Family Medicine

## 2018-12-13 NOTE — Telephone Encounter (Signed)
Forwarding medication refill requests to PCP for review. 

## 2018-12-13 NOTE — Telephone Encounter (Signed)
Copied from [place] [id number]. Topic: Quick Communication - Rx Refill/Question >> Dec 13, 2018  4:02 PM Leward Quan A wrote: Medication: omeprazole (PRILOSEC) 20 MG capsule, amLODipine-olmesartan (AZOR) 10-40 MG tablet, levothyroxine (SYNTHROID) 125 MCG tablet, metoprolol succinate (TOPROL-XL) 50 MG 24 hr tablet  Has the patient contacted their pharmacy? Yes.   (Agent: If no, request that the patient contact the pharmacy for the refill.) (Agent: If yes, when and what did the pharmacy advise?)  Preferred Pharmacy (with phone number or street name): [facility] [place], [place] [phone] (Phone) [phone] (Fax)    Agent: Please be advised that RX refills may take up to 3 business days. We ask that you follow-up with your pharmacy.

## 2018-12-14 ENCOUNTER — Telehealth: Payer: Self-pay | Admitting: Family Medicine

## 2018-12-14 NOTE — Telephone Encounter (Signed)
Refill 90 days but needs visit

## 2018-12-14 NOTE — Telephone Encounter (Signed)
Please call patient to schedule appointment for medication refills.  Received requests from  [facility] for multiple medications: Levothyroxine 125MCG Amloipine-olmesartan 10-40 MG Omeprazole 20 MG Metoprolol Succinate ER 50 MG Last visit on 02/02/18 and notes on last refill  Of 09/19/18 state that patient needs OV before refilling again.  Please advise

## 2018-12-15 MED ORDER — LEVOTHYROXINE SODIUM 125 MCG PO TABS: 125.0000 ug | ORAL_TABLET | Freq: Every day | ORAL | 0 refills | Status: DC

## 2018-12-15 MED ORDER — AMLODIPINE-OLMESARTAN 10-40 MG PO TABS: ORAL_TABLET | ORAL | 0 refills | Status: DC

## 2018-12-15 MED ORDER — OMEPRAZOLE 20 MG PO CPDR: DELAYED_RELEASE_CAPSULE | ORAL | 0 refills | Status: DC

## 2018-12-15 MED ORDER — METOPROLOL SUCCINATE ER 50 MG PO TB24: ORAL_TABLET | ORAL | 0 refills | Status: DC

## 2018-12-15 NOTE — Telephone Encounter (Signed)
Sent in refill

## 2018-12-19 DIAGNOSIS — M79672 Pain in left foot: Secondary | ICD-10-CM | POA: Diagnosis not present

## 2018-12-19 DIAGNOSIS — M9906 Segmental and somatic dysfunction of lower extremity: Secondary | ICD-10-CM | POA: Diagnosis not present

## 2018-12-19 DIAGNOSIS — M5136 Other intervertebral disc degeneration, lumbar region: Secondary | ICD-10-CM | POA: Diagnosis not present

## 2018-12-19 DIAGNOSIS — M9903 Segmental and somatic dysfunction of lumbar region: Secondary | ICD-10-CM | POA: Diagnosis not present

## 2018-12-20 ENCOUNTER — Encounter: Payer: Self-pay | Admitting: Family Medicine

## 2018-12-20 ENCOUNTER — Ambulatory Visit (INDEPENDENT_AMBULATORY_CARE_PROVIDER_SITE_OTHER): Payer: Medicare Other | Admitting: Family Medicine

## 2018-12-20 VITALS — BP 135/75 | Wt 224.0 lb

## 2018-12-20 DIAGNOSIS — E785 Hyperlipidemia, unspecified: Secondary | ICD-10-CM

## 2018-12-20 DIAGNOSIS — I10 Essential (primary) hypertension: Secondary | ICD-10-CM | POA: Diagnosis not present

## 2018-12-20 DIAGNOSIS — R739 Hyperglycemia, unspecified: Secondary | ICD-10-CM | POA: Diagnosis not present

## 2018-12-20 DIAGNOSIS — E039 Hypothyroidism, unspecified: Secondary | ICD-10-CM | POA: Diagnosis not present

## 2018-12-20 DIAGNOSIS — R809 Proteinuria, unspecified: Secondary | ICD-10-CM

## 2018-12-20 MED ORDER — TADALAFIL 20 MG PO TABS: ORAL_TABLET | ORAL | 11 refills | Status: DC

## 2018-12-20 MED ORDER — OMEPRAZOLE 20 MG PO CPDR: DELAYED_RELEASE_CAPSULE | ORAL | 3 refills | Status: DC

## 2018-12-20 MED ORDER — METOPROLOL SUCCINATE ER 50 MG PO TB24: ORAL_TABLET | ORAL | 3 refills | Status: DC

## 2018-12-20 MED ORDER — AMLODIPINE-OLMESARTAN 10-40 MG PO TABS: ORAL_TABLET | ORAL | 3 refills | Status: DC

## 2018-12-20 MED ORDER — LEVOTHYROXINE SODIUM 125 MCG PO TABS: 125.0000 ug | ORAL_TABLET | Freq: Every day | ORAL | 3 refills | Status: DC

## 2018-12-20 NOTE — Patient Instructions (Addendum)
#   plans for colonoscopy next year- concern with covid 19  Declines flu shot-severe reaction years ago

## 2018-12-20 NOTE — Progress Notes (Signed)
Phone [phone] Virtual visit via Video note.     Subjective:   Chief complaint: Chief Complaint  Patient presents with  . virtual  . medication refills  . thyroid/BP meds   This visit type was conducted due to national recommendations for restrictions regarding the COVID-19 Pandemic (e.g. social distancing).  This format is felt to be most appropriate for this patient at this time balancing risks to patient and risks to population by having him in for in person visit.  No physical exam was performed (except for noted visual exam or audio findings with Telehealth visits).    Our team/I connected with [name] at  8:40 AM EST by a video enabled telemedicine application (doxy.me or caregility through epic) and verified that I am speaking with the correct person using two identifiers.  Location patient: [facility] Location provider: [facility], office Persons participating in the virtual visit:  patient  Our team/I discussed the limitations of evaluation and management by telemedicine and the availability of in person appointments. In light of current covid-19 pandemic, patient also understands that we are trying to protect them by minimizing in office contact if at all possible.  The patient expressed consent for telemedicine visit and agreed to proceed. Patient understands insurance will be billed.   ROS- No chest pain or shortness of breath. No headache or blurry vision.    Past Medical History-  Patient Active Problem List   Diagnosis Date Noted  . Hyperglycemia 03/22/2016    Priority: Medium  . Recurrent cold sores 01/07/2015    Priority: Medium  . Hypothyroid 04/28/2011    Priority: Medium  . Hyperlipidemia 12/09/2006    Priority: Medium  . Essential hypertension 08/18/2006    Priority: Medium  . Erectile dysfunction 01/07/2015    Priority: Low  . S/P right THA, AA 10/08/2014    Priority: Low  . Knee osteoarthritis 04/25/2014    Priority: Low  . GERD  (gastroesophageal reflux disease) 08/03/2012    Priority: Low  . Osteoarthritis 02/27/2010    Priority: Low  . Gout 08/18/2006    Priority: Low  . Personal history of rectal adenoma with high-grade dysplasia 08/18/2006    Priority: Low  . Proteinuria 02/02/2018    Medications- reviewed and updated Current Outpatient Medications  Medication Sig Dispense Refill  . acyclovir (ZOVIRAX) 400 MG tablet Take 1 tablet (400 mg total) by mouth 5 (five) times daily. For 5 days maximum for cold sores 50 tablet 1  . amLODipine-olmesartan (AZOR) 10-40 MG tablet TAKE 1 TABLET BY MOUTH  DAILY 90 tablet 3  . glucosamine-chondroitin 500-400 MG tablet Take 2 tablets by mouth daily.    Marland Kitchen levothyroxine (SYNTHROID) 125 MCG tablet Take 1 tablet (125 mcg total) by mouth daily. 90 tablet 3  . metoprolol succinate (TOPROL-XL) 50 MG 24 hr tablet Take with or immediately following a meal. 90 tablet 3  . Multiple Vitamin (MULTIVITAMIN WITH MINERALS) TABS tablet Take 1 tablet by mouth daily.    . NON FORMULARY Take 2 capsules by mouth daily. Vitamin to help lower cholesterol    . omeprazole (PRILOSEC) 20 MG capsule One daily 90 capsule 3  . Probiotic Product (PROBIOTIC DAILY PO) Take 1 tablet by mouth daily.    . tadalafil (CIALIS) 20 MG tablet TAKE 1 TABLET BY MOUTH EVERY OTHER DAY AS NEEDED FOR ERECTILE DYSFUNCTION. 10 tablet 11   No current facility-administered medications for this visit.      Objective:  BP 135/75   Wt  224 lb (101.6 kg)   BMI 32.84 kg/m  self reported vitals Gen: NAD, resting comfortably Lungs: nonlabored, normal respiratory rate  Skin: appears dry, no obvious rash     Assessment and Plan   # social update-  sold house- living with daughter in [place] and looking for house in different areas.  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  Has cut down on portion size. Eating healthy with daughter BP Readings from Last 3 Encounters:  12/20/18 135/75  02/21/18 130/76  02/02/18 138/88  A/P: Stable. Continue current medications.   #hypothyroidism S: compliant On thyroid medication-levothyroxiine 125 mcg   With weight loss- has been intentional. No diarrhea. No heat intolerance. No anxiety or shakiness Lab Results  Component Value Date   TSH 4.50 05/24/2017  A/P: likely stable- hopefully can update labs somet soon  # GERD S:compliant with omeprazole 20 mg. Very rare breakthrough symptoms on medicine.    Takes a b12 supplement so would be lower risk for depletion A/P: stable- discussed potentially trying pepcid in the future- possibly at 6 month visit.   - could try at home if he wanted to  #hyperlipidemia S:  Takes an OTC cholesterol vitamin- he stopped rosuvastatin 145monce a week- did not take that concerned about side effects- Gynecomastia on lipitor in the past.  Lab Results  Component Value Date   CHOL 250 (H) 05/24/2017   HDL 46.80 05/24/2017   LDLCALC 138 (H) 03/15/2016   LDLDIRECT 153.0 05/24/2017   TRIG 264.0 (H) 05/24/2017   CHOLHDL 5 05/24/2017   A/P: possible poor control- will update lipid panel  # no recent cold sores- can refill for up to 6 months from today  #24 hour urine collection kit- could pick this up when comes by for labs  # plans for colonoscopy next year- concern with covid 19  # declines flu shot- had been hospitalized previously  #Hyperglycemia-thankful for weight loss.  We will update A1c when he comes back for labs   Recommended follow up: 6-29-monthysical recommended-he will come by for labs in the coming weeks-ordered future labs today  Lab/Order associations:   ICD-10-CM   1. Hypothyroidism, unspecified type  E03.9 TSH  2. Hyperlipidemia, unspecified hyperlipidemia type  E78.5 CBC with Differential/Platelet    Comprehensive  metabolic panel    Lipid panel  3. Essential hypertension  I10 CBC with Differential/Platelet    Comprehensive metabolic panel    Lipid panel  4. Hyperglycemia  R73.9 Hemoglobin A1c  5. Proteinuria, unspecified type  R80.9 Microalbumin / creatinine urine ratio    Meds ordered this encounter  Medications  . amLODipine-olmesartan (AZOR) 10-40 MG tablet    Sig: TAKE 1 TABLET BY MOUTH  DAILY    Dispense:  90 tablet    Refill:  3  . metoprolol succinate (TOPROL-XL) 50 MG 24 hr tablet    Sig: Take with or immediately following a meal.    Dispense:  90 tablet    Refill:  3  . levothyroxine (SYNTHROID) 125 MCG tablet    Sig: Take 1 tablet (125 mcg total) by mouth daily.    Dispense:  90 tablet    Refill:  3  . omeprazole (PRILOSEC) 20 MG capsule    Sig: One daily    Dispense:  90 capsule  Refill:  3  . tadalafil (CIALIS) 20 MG tablet    Sig: TAKE 1 TABLET BY MOUTH EVERY OTHER DAY AS NEEDED FOR ERECTILE DYSFUNCTION.    Dispense:  10 tablet    Refill:  11    Return precautions advised.  [name], MD

## 2019-01-05 ENCOUNTER — Other Ambulatory Visit: Payer: Self-pay

## 2019-01-05 MED ORDER — METOPROLOL SUCCINATE ER 50 MG PO TB24: ORAL_TABLET | ORAL | 3 refills | Status: DC

## 2019-01-06 DIAGNOSIS — M9903 Segmental and somatic dysfunction of lumbar region: Secondary | ICD-10-CM | POA: Diagnosis not present

## 2019-01-06 DIAGNOSIS — M9906 Segmental and somatic dysfunction of lower extremity: Secondary | ICD-10-CM | POA: Diagnosis not present

## 2019-01-06 DIAGNOSIS — M5136 Other intervertebral disc degeneration, lumbar region: Secondary | ICD-10-CM | POA: Diagnosis not present

## 2019-01-06 DIAGNOSIS — M79672 Pain in left foot: Secondary | ICD-10-CM | POA: Diagnosis not present

## 2019-01-17 DIAGNOSIS — M9903 Segmental and somatic dysfunction of lumbar region: Secondary | ICD-10-CM | POA: Diagnosis not present

## 2019-01-17 DIAGNOSIS — M9906 Segmental and somatic dysfunction of lower extremity: Secondary | ICD-10-CM | POA: Diagnosis not present

## 2019-01-17 DIAGNOSIS — M79672 Pain in left foot: Secondary | ICD-10-CM | POA: Diagnosis not present

## 2019-01-17 DIAGNOSIS — M5136 Other intervertebral disc degeneration, lumbar region: Secondary | ICD-10-CM | POA: Diagnosis not present

## 2019-01-27 DIAGNOSIS — M79672 Pain in left foot: Secondary | ICD-10-CM | POA: Diagnosis not present

## 2019-01-27 DIAGNOSIS — M5136 Other intervertebral disc degeneration, lumbar region: Secondary | ICD-10-CM | POA: Diagnosis not present

## 2019-01-27 DIAGNOSIS — M9906 Segmental and somatic dysfunction of lower extremity: Secondary | ICD-10-CM | POA: Diagnosis not present

## 2019-01-27 DIAGNOSIS — M9903 Segmental and somatic dysfunction of lumbar region: Secondary | ICD-10-CM | POA: Diagnosis not present

## 2019-02-10 DIAGNOSIS — M79672 Pain in left foot: Secondary | ICD-10-CM | POA: Diagnosis not present

## 2019-02-10 DIAGNOSIS — M9906 Segmental and somatic dysfunction of lower extremity: Secondary | ICD-10-CM | POA: Diagnosis not present

## 2019-02-10 DIAGNOSIS — M9903 Segmental and somatic dysfunction of lumbar region: Secondary | ICD-10-CM | POA: Diagnosis not present

## 2019-02-10 DIAGNOSIS — M5136 Other intervertebral disc degeneration, lumbar region: Secondary | ICD-10-CM | POA: Diagnosis not present

## 2019-02-11 DIAGNOSIS — M5136 Other intervertebral disc degeneration, lumbar region: Secondary | ICD-10-CM | POA: Diagnosis not present

## 2019-02-11 DIAGNOSIS — M9903 Segmental and somatic dysfunction of lumbar region: Secondary | ICD-10-CM | POA: Diagnosis not present

## 2019-02-11 DIAGNOSIS — M9906 Segmental and somatic dysfunction of lower extremity: Secondary | ICD-10-CM | POA: Diagnosis not present

## 2019-02-11 DIAGNOSIS — M79672 Pain in left foot: Secondary | ICD-10-CM | POA: Diagnosis not present

## 2019-02-12 DIAGNOSIS — M9903 Segmental and somatic dysfunction of lumbar region: Secondary | ICD-10-CM | POA: Diagnosis not present

## 2019-02-12 DIAGNOSIS — M79672 Pain in left foot: Secondary | ICD-10-CM | POA: Diagnosis not present

## 2019-02-12 DIAGNOSIS — M5136 Other intervertebral disc degeneration, lumbar region: Secondary | ICD-10-CM | POA: Diagnosis not present

## 2019-02-12 DIAGNOSIS — M9906 Segmental and somatic dysfunction of lower extremity: Secondary | ICD-10-CM | POA: Diagnosis not present

## 2019-02-14 ENCOUNTER — Telehealth: Payer: Self-pay | Admitting: Family Medicine

## 2019-02-14 NOTE — Telephone Encounter (Signed)
Medication Refill - Medication: erythromycin ophthalmic ointment   Pt has a stye on his eye, going on for 2 weeks  Has the patient contacted their pharmacy? Yes.   (Agent: If no, request that the patient contact the pharmacy for the refill.) (Agent: If yes, when and what did the pharmacy advise?)  Preferred Pharmacy (with phone number or street name):  [id number] - [place], [place] - [address]  [place] [address]  Phone: [phone] Fax: [phone]     Agent: Please be advised that RX refills may take up to 3 business days. We ask that you follow-up with your pharmacy.

## 2019-02-14 NOTE — Telephone Encounter (Signed)
See note

## 2019-02-15 NOTE — Telephone Encounter (Signed)
Please call for appointment ?

## 2019-02-19 ENCOUNTER — Ambulatory Visit (INDEPENDENT_AMBULATORY_CARE_PROVIDER_SITE_OTHER): Payer: Medicare Other | Admitting: Physician Assistant

## 2019-02-19 ENCOUNTER — Encounter: Payer: Self-pay | Admitting: Physician Assistant

## 2019-02-19 DIAGNOSIS — H00014 Hordeolum externum left upper eyelid: Secondary | ICD-10-CM | POA: Diagnosis not present

## 2019-02-19 MED ORDER — ERYTHROMYCIN 5 MG/GM OP OINT: TOPICAL_OINTMENT | OPHTHALMIC | 0 refills | Status: DC

## 2019-02-19 NOTE — Telephone Encounter (Signed)
See below

## 2019-02-19 NOTE — Progress Notes (Signed)
Virtual Visit via Video   I connected with [name] on 02/19/19 at  9:20 AM EST by a video enabled telemedicine application and verified that I am speaking with the correct person using two identifiers. Location patient: Home Location provider: [facility], Office Persons participating in the virtual visit: MAYFORD BECKSTRAND, Inda Coke PA-C  I discussed the limitations of evaluation and management by telemedicine and the availability of in person appointments. The patient expressed understanding and agreed to proceed.  Subjective:   HPI:   Stye Patient reports that he has had a stye that started last Wensday. History of stye, and sx are the same as prior presentation. Left upper eyelid with small red bump and swelling. Has tried tea bags for compression with very mild relief.  Denies: vision changes, severe eye pain, drainage from eye, dizziness, trauma  ROS: See pertinent positives and negatives per HPI.  Patient Active Problem List   Diagnosis Date Noted  . Proteinuria 02/02/2018  . Hyperglycemia 03/22/2016  . Recurrent cold sores 01/07/2015  . Erectile dysfunction 01/07/2015  . S/P right THA, AA 10/08/2014  . Knee osteoarthritis 04/25/2014  . GERD (gastroesophageal reflux disease) 08/03/2012  . Hypothyroid 04/28/2011  . Osteoarthritis 02/27/2010  . Hyperlipidemia 12/09/2006  . Gout 08/18/2006  . Essential hypertension 08/18/2006  . Personal history of rectal adenoma with high-grade dysplasia 08/18/2006    Social History   Tobacco Use  . Smoking status: Never Smoker  . Smokeless tobacco: Never Used  Substance Use Topics  . Alcohol use: Yes    Alcohol/week: 10.0 standard drinks    Types: 10 Standard drinks or equivalent per week    Comment: occasional     Current Outpatient Medications:  .  acyclovir (ZOVIRAX) 400 MG tablet, Take 1 tablet (400 mg total) by mouth 5 (five) times daily. For 5 days maximum for cold sores, Disp: 50 tablet, Rfl: 1 .   amLODipine-olmesartan (AZOR) 10-40 MG tablet, TAKE 1 TABLET BY MOUTH  DAILY, Disp: 90 tablet, Rfl: 3 .  glucosamine-chondroitin 500-400 MG tablet, Take 2 tablets by mouth daily., Disp: , Rfl:  .  levothyroxine (SYNTHROID) 125 MCG tablet, Take 1 tablet (125 mcg total) by mouth daily., Disp: 90 tablet, Rfl: 3 .  metoprolol succinate (TOPROL-XL) 50 MG 24 hr tablet, Take with or immediately following a meal. Once daily, Disp: 90 tablet, Rfl: 3 .  Multiple Vitamin (MULTIVITAMIN WITH MINERALS) TABS tablet, Take 1 tablet by mouth daily., Disp: , Rfl:  .  NON FORMULARY, Take 2 capsules by mouth daily. Vitamin to help lower cholesterol, Disp: , Rfl:  .  omeprazole (PRILOSEC) 20 MG capsule, One daily, Disp: 90 capsule, Rfl: 3 .  Probiotic Product (PROBIOTIC DAILY PO), Take 1 tablet by mouth daily., Disp: , Rfl:  .  tadalafil (CIALIS) 20 MG tablet, TAKE 1 TABLET BY MOUTH EVERY OTHER DAY AS NEEDED FOR ERECTILE DYSFUNCTION., Disp: 10 tablet, Rfl: 11 .  erythromycin ophthalmic ointment, Apply to affected eye BID x 3-5 days, Disp: 3.5 g, Rfl: 0  Allergies  Allergen Reactions  . Ace Inhibitors     Gout  . Atorvastatin     REACTION: breast tenderness  . Doxycycline     REACTION: stomach cramping. flatus    Objective:   VITALS: Per patient if applicable, see vitals. GENERAL: Alert, appears well and in no acute distress. HEENT: Atraumatic, no obvious abnormalities on inspection of external nose and ears. L upper eyelid with slight swelling, normal EOM on inspection  NECK: Normal movements of the head and neck. CARDIOPULMONARY: No increased WOB. Speaking in clear sentences. I:E ratio WNL.  MS: Moves all visible extremities without noticeable abnormality. PSYCH: Pleasant and cooperative, well-groomed. Speech normal rate and rhythm. Affect is appropriate. Insight and judgement are appropriate. Attention is focused, linear, and appropriate.  NEURO: CN grossly intact. Oriented as arrived to appointment on time  with no prompting. Moves both UE equally.  SKIN: No obvious lesions, wounds, erythema, or cyanosis noted on face or hands.  Assessment and Plan:   Theofanis was seen today for eye pain.  Diagnoses and all orders for this visit:  Hordeolum externum of left upper eyelid  Other orders -     erythromycin ophthalmic ointment; Apply to affected eye BID x 3-5 days   No red flags on discussion. Will trial erythromycin ointment. Worsening precautions advised.  . Reviewed expectations re: course of current medical issues. . Discussed self-management of symptoms. . Outlined signs and symptoms indicating need for more acute intervention. . Patient verbalized understanding and all questions were answered. Marland Kitchen Health Maintenance issues including appropriate healthy diet, exercise, and smoking avoidance were discussed with patient. . See orders for this visit as documented in the electronic medical record.  I discussed the assessment and treatment plan with the patient. The patient was provided an opportunity to ask questions and all were answered. The patient agreed with the plan and demonstrated an understanding of the instructions.   The patient was advised to call back or seek an in-person evaluation if the symptoms worsen or if the condition fails to improve as anticipated.   CMA or LPN served as scribe during this visit. History, Physical, and Plan performed by medical provider. The above documentation has been reviewed and is accurate and complete.  [place], [place] 02/19/2019

## 2019-02-19 NOTE — Telephone Encounter (Signed)
Pt is aware he needs an appt. Pt said he is 200 miles away

## 2019-02-20 NOTE — Telephone Encounter (Signed)
Patient did a virtual visit with Sam on 02/19/19

## 2019-02-20 NOTE — Telephone Encounter (Signed)
Please call and see if he will do virtual app

## 2019-02-21 DIAGNOSIS — M79672 Pain in left foot: Secondary | ICD-10-CM | POA: Diagnosis not present

## 2019-02-21 DIAGNOSIS — M9903 Segmental and somatic dysfunction of lumbar region: Secondary | ICD-10-CM | POA: Diagnosis not present

## 2019-02-21 DIAGNOSIS — M5136 Other intervertebral disc degeneration, lumbar region: Secondary | ICD-10-CM | POA: Diagnosis not present

## 2019-02-21 DIAGNOSIS — M9906 Segmental and somatic dysfunction of lower extremity: Secondary | ICD-10-CM | POA: Diagnosis not present

## 2019-02-28 ENCOUNTER — Other Ambulatory Visit: Payer: Self-pay | Admitting: Family Medicine

## 2019-03-01 DIAGNOSIS — M9906 Segmental and somatic dysfunction of lower extremity: Secondary | ICD-10-CM | POA: Diagnosis not present

## 2019-03-01 DIAGNOSIS — M79672 Pain in left foot: Secondary | ICD-10-CM | POA: Diagnosis not present

## 2019-03-01 DIAGNOSIS — M5136 Other intervertebral disc degeneration, lumbar region: Secondary | ICD-10-CM | POA: Diagnosis not present

## 2019-03-01 DIAGNOSIS — M9903 Segmental and somatic dysfunction of lumbar region: Secondary | ICD-10-CM | POA: Diagnosis not present

## 2019-03-08 DIAGNOSIS — M9906 Segmental and somatic dysfunction of lower extremity: Secondary | ICD-10-CM | POA: Diagnosis not present

## 2019-03-08 DIAGNOSIS — M5136 Other intervertebral disc degeneration, lumbar region: Secondary | ICD-10-CM | POA: Diagnosis not present

## 2019-03-08 DIAGNOSIS — M9903 Segmental and somatic dysfunction of lumbar region: Secondary | ICD-10-CM | POA: Diagnosis not present

## 2019-03-08 DIAGNOSIS — M79672 Pain in left foot: Secondary | ICD-10-CM | POA: Diagnosis not present

## 2019-03-14 DIAGNOSIS — M5136 Other intervertebral disc degeneration, lumbar region: Secondary | ICD-10-CM | POA: Diagnosis not present

## 2019-03-14 DIAGNOSIS — M9906 Segmental and somatic dysfunction of lower extremity: Secondary | ICD-10-CM | POA: Diagnosis not present

## 2019-03-14 DIAGNOSIS — M9903 Segmental and somatic dysfunction of lumbar region: Secondary | ICD-10-CM | POA: Diagnosis not present

## 2019-03-14 DIAGNOSIS — M79672 Pain in left foot: Secondary | ICD-10-CM | POA: Diagnosis not present

## 2019-04-04 DIAGNOSIS — M9906 Segmental and somatic dysfunction of lower extremity: Secondary | ICD-10-CM | POA: Diagnosis not present

## 2019-04-04 DIAGNOSIS — M9903 Segmental and somatic dysfunction of lumbar region: Secondary | ICD-10-CM | POA: Diagnosis not present

## 2019-04-04 DIAGNOSIS — M79672 Pain in left foot: Secondary | ICD-10-CM | POA: Diagnosis not present

## 2019-04-04 DIAGNOSIS — M5136 Other intervertebral disc degeneration, lumbar region: Secondary | ICD-10-CM | POA: Diagnosis not present

## 2019-04-06 DIAGNOSIS — M5136 Other intervertebral disc degeneration, lumbar region: Secondary | ICD-10-CM | POA: Diagnosis not present

## 2019-04-06 DIAGNOSIS — M9903 Segmental and somatic dysfunction of lumbar region: Secondary | ICD-10-CM | POA: Diagnosis not present

## 2019-04-06 DIAGNOSIS — M79672 Pain in left foot: Secondary | ICD-10-CM | POA: Diagnosis not present

## 2019-04-06 DIAGNOSIS — M9906 Segmental and somatic dysfunction of lower extremity: Secondary | ICD-10-CM | POA: Diagnosis not present

## 2019-04-12 DIAGNOSIS — M79672 Pain in left foot: Secondary | ICD-10-CM | POA: Diagnosis not present

## 2019-04-12 DIAGNOSIS — M5136 Other intervertebral disc degeneration, lumbar region: Secondary | ICD-10-CM | POA: Diagnosis not present

## 2019-04-12 DIAGNOSIS — M9903 Segmental and somatic dysfunction of lumbar region: Secondary | ICD-10-CM | POA: Diagnosis not present

## 2019-04-12 DIAGNOSIS — M9906 Segmental and somatic dysfunction of lower extremity: Secondary | ICD-10-CM | POA: Diagnosis not present

## 2019-04-19 DIAGNOSIS — M9903 Segmental and somatic dysfunction of lumbar region: Secondary | ICD-10-CM | POA: Diagnosis not present

## 2019-04-19 DIAGNOSIS — M79672 Pain in left foot: Secondary | ICD-10-CM | POA: Diagnosis not present

## 2019-04-19 DIAGNOSIS — M5136 Other intervertebral disc degeneration, lumbar region: Secondary | ICD-10-CM | POA: Diagnosis not present

## 2019-04-19 DIAGNOSIS — M9906 Segmental and somatic dysfunction of lower extremity: Secondary | ICD-10-CM | POA: Diagnosis not present

## 2019-05-01 ENCOUNTER — Ambulatory Visit (INDEPENDENT_AMBULATORY_CARE_PROVIDER_SITE_OTHER): Payer: Medicare Other

## 2019-05-01 ENCOUNTER — Other Ambulatory Visit: Payer: Self-pay

## 2019-05-01 ENCOUNTER — Encounter (INDEPENDENT_AMBULATORY_CARE_PROVIDER_SITE_OTHER): Payer: Self-pay

## 2019-05-01 DIAGNOSIS — Z0000 Encounter for general adult medical examination without abnormal findings: Secondary | ICD-10-CM | POA: Diagnosis not present

## 2019-05-01 MED ORDER — AMLODIPINE-OLMESARTAN 10-40 MG PO TABS: 1.0000 | ORAL_TABLET | Freq: Every day | ORAL | 0 refills | Status: DC

## 2019-05-01 MED ORDER — LEVOTHYROXINE SODIUM 125 MCG PO TABS: 125.0000 ug | ORAL_TABLET | Freq: Every day | ORAL | 0 refills | Status: DC

## 2019-05-01 MED ORDER — METOPROLOL SUCCINATE ER 50 MG PO TB24: ORAL_TABLET | ORAL | 0 refills | Status: DC

## 2019-05-01 MED ORDER — OMEPRAZOLE 20 MG PO CPDR: DELAYED_RELEASE_CAPSULE | ORAL | 0 refills | Status: DC

## 2019-05-01 NOTE — Progress Notes (Signed)
This visit is being conducted via phone call due to the COVID-19 pandemic. This patient has given me verbal consent via phone to conduct this visit, patient states they are participating from their home address. Some vital signs may be absent or patient reported.   Patient identification: identified by name, DOB, and current address.  Location provider: [facility], Office Persons participating in the virtual visit: [place]    Subjective:   [name] is a 74 y.o. male who presents for Medicare Annual/Subsequent preventive examination.  Review of Systems:   Cardiac Risk Factors include: male gender;hypertension;advanced age (>22men, >73 women)    Objective:    Vitals: There were no vitals taken for this visit.  There is no height or weight on file to calculate BMI.  Advanced Directives 05/01/2019 10/08/2014 10/08/2014 10/02/2014 09/28/2013 09/21/2013 06/29/2013  Does Patient Have a Medical Advance Directive? No No - No No Patient does not have advance directive;Patient would not like information Patient does not have advance directive;Patient would not like information  Would patient like information on creating a medical advance directive? No - Patient declined No - patient declined information No - patient declined information - No - patient declined information - -  Pre-existing out of facility DNR order (yellow form or pink MOST form) - - - - - - -    Tobacco Social History   Tobacco Use  Smoking Status Never Smoker  Smokeless Tobacco Never Used     Counseling given: Not Answered   Clinical Intake:  Pre-visit preparation completed: Yes  Pain : No/denies pain  Diabetes: No  How often do you need to have someone help you when you read instructions, pamphlets, or other written materials from your doctor or pharmacy?: 1 - Never  Interpreter Needed?: No  Information entered by :: Denman George LPN  Past Medical History:  Diagnosis Date  . Abnormal mammogram 04/03/2008    L side- lipitor related, resolved off medication    . Arthritis   . Benign fundic gland polyps of stomach   . Cancer ([place])    jaw removal due to cancer- 40 years ago   . GERD (gastroesophageal reflux disease)   . GI bleed 2005   after colonoscopy  . Gout   . HTN (hypertension)   . Hyperlipidemia   . Hypothyroidism   . Irregular bowel habits    occ. irregular bowel elimination  . Personal history of rectal adenoma with high-grade dysplasia and colonic adenomas    Past Surgical History:  Procedure Laterality Date  . COLONOSCOPY    . JOINT REPLACEMENT Right 09/2013   left knee0 09/2013, right knee- 2011   . KNEE ARTHROSCOPY  2011   Dr. Tonita Cong  . KNEE ARTHROSCOPY WITH MEDIAL MENISECTOMY Left 06/29/2013   Procedure: LEFT KNEE ARTHROSCOPY WITH DEBRIDEMENT AND A PARTIAL MEDIAL MENISECTOMY;  Surgeon: Johnn Hai, MD;  Location: [facility];  Service: Orthopedics;  Laterality: Left;  . TONSILLECTOMY    . TOTAL HIP ARTHROPLASTY Right 10/08/2014   Procedure: RIGHT TOTAL HIP ARTHROPLASTY ANTERIOR APPROACH;  Surgeon: Paralee Cancel, MD;  Location: [facility];  Service: Orthopedics;  Laterality: Right;  . TOTAL KNEE ARTHROPLASTY  6/11, 09/2013   Dr. Tonita Cong  . TOTAL KNEE ARTHROPLASTY Left 09/28/2013   Procedure: LEFT TOTAL KNEE ARTHROPLASTY;  Surgeon: Johnn Hai, MD;  Location: [facility];  Service: Orthopedics;  Laterality: Left;  . TUMOR REMOVAL  1984   from [place] from hip in jaw  .  UPPER GASTROINTESTINAL ENDOSCOPY     Family History  Problem Relation Age of Onset  . Stroke Mother   . Diabetes Mother   . Colon cancer Neg Hx    Social History   Socioeconomic History  . Marital status: Married    Spouse name: Not on file  . Number of children: 2  . Years of education: Not on file  . Highest education level: Not on file  Occupational History    Employer: [employer]  Tobacco Use  . Smoking status: Never Smoker  . Smokeless tobacco: Never Used   Substance and Sexual Activity  . Alcohol use: Yes    Alcohol/week: 10.0 standard drinks    Types: 10 Standard drinks or equivalent per week    Comment: occasional   . Drug use: No  . Sexual activity: Yes  Other Topics Concern  . Not on file  Social History Narrative   Married 25 years (wife patient of Dr. Yong Channel), 1 son attorney in [place]- 2 daughts, daughter  With MS and 2 granddaughters.       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    Had worked for for [employer]. A lot of travel including international.       Hobbies: garden, cooking, beach trips      Moved to [facility] to be closer to daughter    Social Determinants of Radio broadcast assistant Strain:   . Difficulty of Paying Living Expenses:   Food Insecurity:   . Worried About Charity fundraiser in the Last Year:   . Arboriculturist in the Last Year:   Transportation Needs:   . Film/video editor (Medical):   Marland Kitchen Lack of Transportation (Non-Medical):   Physical Activity:   . Days of Exercise per Week:   . Minutes of Exercise per Session:   Stress:   . Feeling of Stress :   Social Connections:   . Frequency of Communication with Friends and Family:   . Frequency of Social Gatherings with Friends and Family:   . Attends Religious Services:   . Active Member of Clubs or Organizations:   . Attends Archivist Meetings:   Marland Kitchen Marital Status:     Outpatient Encounter Medications as of 05/01/2019  Medication Sig  . acyclovir (ZOVIRAX) 400 MG tablet Take 1 tablet (400 mg total) by mouth 5 (five) times daily. For 5 days maximum for cold sores  . amLODipine-olmesartan (AZOR) 10-40 MG tablet TAKE 1 TABLET BY MOUTH  DAILY  . erythromycin ophthalmic ointment Apply to affected eye BID x 3-5 days  . glucosamine-chondroitin 500-400 MG tablet Take 2 tablets by mouth daily.  Marland Kitchen levothyroxine (SYNTHROID) 125 MCG tablet TAKE 1 TABLET BY MOUTH  DAILY  . metoprolol succinate  (TOPROL-XL) 50 MG 24 hr tablet Take with or immediately following a meal. Once daily  . Multiple Vitamin (MULTIVITAMIN WITH MINERALS) TABS tablet Take 1 tablet by mouth daily.  . NON FORMULARY Take 2 capsules by mouth daily. Vitamin to help lower cholesterol  . omeprazole (PRILOSEC) 20 MG capsule TAKE 1 CAPSULE BY MOUTH  DAILY  . Probiotic Product (PROBIOTIC DAILY PO) Take 1 tablet by mouth daily.  . tadalafil (CIALIS) 20 MG tablet TAKE 1 TABLET BY MOUTH EVERY OTHER DAY AS NEEDED FOR ERECTILE DYSFUNCTION.   No facility-administered encounter medications on file as of 05/01/2019.    Activities of Daily Living  In your present state of health, do you have any difficulty performing the following activities: 05/01/2019  Hearing? N  Vision? N  Difficulty concentrating or making decisions? N  Walking or climbing stairs? N  Dressing or bathing? N  Doing errands, shopping? N  Preparing Food and eating ? N  Using the Toilet? N  In the past six months, have you accidently leaked urine? N  Do you have problems with loss of bowel control? N  Managing your Medications? N  Managing your Finances? N  Housekeeping or managing your Housekeeping? N  Some recent data might be hidden    Patient Care Team: Marin Olp, MD as PCP - General (Family Medicine)   Assessment:   This is a routine wellness examination for [facility].  Exercise Activities and Dietary recommendations Current Exercise Habits: Home exercise routine, Type of exercise: walking, Time (Minutes): 30, Frequency (Times/Week): 5, Weekly Exercise (Minutes/Week): 150, Intensity: Mild  Goals   None     Fall Risk Fall Risk  05/01/2019 05/24/2017 03/22/2016 09/30/2014 06/11/2013  Falls in the past year? 0 No No No No  Number falls in past yr: 0 - - - -  Injury with Fall? 0 - - - -  Follow up Falls evaluation completed;Education provided;Falls prevention discussed - - - -   Is the patient's home free of loose throw rugs in walkways, pet  beds, electrical cords, etc?   yes      Grab bars in the bathroom? yes      Handrails on the stairs?   yes      Adequate lighting?   yes  Depression Screen PHQ 2/9 Scores 05/01/2019 12/20/2018 05/24/2017 03/22/2016  PHQ - 2 Score 0 0 0 0    Cognitive Function   6CIT Screen 05/01/2019  What Year? 0 points  What month? 0 points  What time? 0 points  Count back from 20 0 points  Months in reverse 0 points  Repeat phrase 0 points  Total Score 0    Immunization History  Administered Date(s) Administered  . Hepatitis A 03/03/2002  . Hepatitis B 03/03/2002  . Pneumococcal Conjugate-13 04/25/2014  . Pneumococcal Polysaccharide-23 08/30/2012  . Td 03/01/2002  . Tdap 08/30/2012    Qualifies for Shingles Vaccine? Discussed and patient will check with pharmacy for coverage.  Patient education handout provided   Screening Tests Health Maintenance  Topic Date Due  . COLONOSCOPY  08/04/2015  . INFLUENZA VACCINE  05/16/2019 (Originally 09/16/2018)  . TETANUS/TDAP  08/31/2022  . Hepatitis C Screening  Completed  . PNA vac Low Risk Adult  Completed   Cancer Screenings: Lung: Low Dose CT Chest recommended if Age 34-80 years, 30 pack-year currently smoking OR have quit w/in 15years. Patient does not qualify. Colorectal: colonoscopy 08/03/12      Plan:  I have personally reviewed and addressed the Medicare Annual Wellness questionnaire and have noted the following in the patient's chart:  A. Medical and social history B. Use of alcohol, tobacco or illicit drugs  C. Current medications and supplements D. Functional ability and status E.  Nutritional status F.  Physical activity G. Advance directives H. List of other physicians I.  Hospitalizations, surgeries, and ER visits in previous 12 months J.  [place] such as hearing and vision if needed, cognitive and depression L. Referrals, records requested, and appointments- none   In addition, I have reviewed and discussed with  patient certain preventive protocols, quality metrics, and best practice recommendations. A  written personalized care plan for preventive services as well as general preventive health recommendations were provided to patient.   Signed,  Denman George, LPN  Nurse Health Advisor   Nurse Notes: Patient is requesting refills on medications.  He did schedule appointment for 05/04/19 for fasting labs.  Medications refilled x 1 with note to schedule appointment before further fills. He has completed Covid vaccine.

## 2019-05-01 NOTE — Patient Instructions (Signed)
[name] , Thank you for taking time to come for your Medicare Wellness Visit. I appreciate your ongoing commitment to your health goals. Please review the following plan we discussed and let me know if I can assist you in the future.   Screening recommendations/referrals: Colorectal Screening: up to date; last colonoscopy 08/03/12  Vision and Dental Exams: Recommended annual ophthalmology exams for early detection of glaucoma and other disorders of the eye Recommended annual dental exams for proper oral hygiene  Vaccinations: Influenza vaccine: Discontinued  Pneumococcal vaccine: up to date; last 04/25/14 Tdap vaccine: up to date; last 08/30/12 Shingles vaccine:  You may receive this vaccine at your local pharmacy. (see handout)   Goals: Recommend to drink at least 6-8 8oz glasses of water per day and consume a balanced diet rich in fresh fruits and vegetables.  Next appointment: Please schedule your Annual Wellness Visit with your Nurse Health Advisor in one year.  Preventive Care 12 Years and Older, Male Preventive care refers to lifestyle choices and visits with your health care provider that can promote health and wellness. What does preventive care include?  A yearly physical exam. This is also called an annual well check.  Dental exams once or twice a year.  Routine eye exams. Ask your health care provider how often you should have your eyes checked.  Personal lifestyle choices, including:  Daily care of your teeth and gums.  Regular physical activity.  Eating a healthy diet.  Avoiding tobacco and drug use.  Limiting alcohol use.  Practicing safe sex.  Taking low doses of aspirin every day if recommended by your health care provider..  Taking vitamin and mineral supplements as recommended by your health care provider. What happens during an annual well check? The services and screenings done by your health care provider during your annual well check will depend  on your age, overall health, lifestyle risk factors, and family history of disease. Counseling  Your health care provider may ask you questions about your:  Alcohol use.  Tobacco use.  Drug use.  Emotional well-being.  Home and relationship well-being.  Sexual activity.  Eating habits.  History of falls.  Memory and ability to understand (cognition).  Work and work Statistician. Screening  You may have the following tests or measurements:  Height, weight, and BMI.  Blood pressure.  Lipid and cholesterol levels. These may be checked every 5 years, or more frequently if you are over 30 years old.  Skin check.  Lung cancer screening. You may have this screening every year starting at age 57 if you have a 30-pack-year history of smoking and currently smoke or have quit within the past 15 years.  Fecal occult blood test (FOBT) of the stool. You may have this test every year starting at age 65.  Flexible sigmoidoscopy or colonoscopy. You may have a sigmoidoscopy every 5 years or a colonoscopy every 10 years starting at age 34.  Prostate cancer screening. Recommendations will vary depending on your family history and other risks.  Hepatitis C blood test.  Hepatitis B blood test.  Sexually transmitted disease (STD) testing.  Diabetes screening. This is done by checking your blood sugar (glucose) after you have not eaten for a while (fasting). You may have this done every 1-3 years.  Abdominal aortic aneurysm (AAA) screening. You may need this if you are a current or former smoker.  Osteoporosis. You may be screened starting at age 4 if you are at high risk. Talk with your  health care provider about your test results, treatment options, and if necessary, the need for more tests. Vaccines  Your health care provider may recommend certain vaccines, such as:  Influenza vaccine. This is recommended every year.  Tetanus, diphtheria, and acellular pertussis (Tdap, Td)  vaccine. You may need a Td booster every 10 years.  Zoster vaccine. You may need this after age 10.  Pneumococcal 13-valent conjugate (PCV13) vaccine. One dose is recommended after age 70.  Pneumococcal polysaccharide (PPSV23) vaccine. One dose is recommended after age 39. Talk to your health care provider about which screenings and vaccines you need and how often you need them. This information is not intended to replace advice given to you by your health care provider. Make sure you discuss any questions you have with your health care provider. Document Released: 02/28/2015 Document Revised: 10/22/2015 Document Reviewed: 12/03/2014 Elsevier Interactive Patient Education  2017 [employer] Prevention in the Home Falls can cause injuries. They can happen to people of all ages. There are many things you can do to make your home safe and to help prevent falls. What can I do on the outside of my home?  Regularly fix the edges of walkways and driveways and fix any cracks.  Remove anything that might make you trip as you walk through a door, such as a raised step or threshold.  Trim any bushes or trees on the path to your home.  Use bright outdoor lighting.  Clear any walking paths of anything that might make someone trip, such as rocks or tools.  Regularly check to see if handrails are loose or broken. Make sure that both sides of any steps have handrails.  Any raised decks and porches should have guardrails on the edges.  Have any leaves, snow, or ice cleared regularly.  Use sand or salt on walking paths during winter.  Clean up any spills in your garage right away. This includes oil or grease spills. What can I do in the bathroom?  Use night lights.  Install grab bars by the toilet and in the tub and shower. Do not use towel bars as grab bars.  Use non-skid mats or decals in the tub or shower.  If you need to sit down in the shower, use a plastic, non-slip  stool.  Keep the floor dry. Clean up any water that spills on the floor as soon as it happens.  Remove soap buildup in the tub or shower regularly.  Attach bath mats securely with double-sided non-slip rug tape.  Do not have throw rugs and other things on the floor that can make you trip. What can I do in the bedroom?  Use night lights.  Make sure that you have a light by your bed that is easy to reach.  Do not use any sheets or blankets that are too big for your bed. They should not hang down onto the floor.  Have a firm chair that has side arms. You can use this for support while you get dressed.  Do not have throw rugs and other things on the floor that can make you trip. What can I do in the kitchen?  Clean up any spills right away.  Avoid walking on wet floors.  Keep items that you use a lot in easy-to-reach places.  If you need to reach something above you, use a strong step stool that has a grab bar.  Keep electrical cords out of the way.  Do not use  floor polish or wax that makes floors slippery. If you must use wax, use non-skid floor wax.  Do not have throw rugs and other things on the floor that can make you trip. What can I do with my stairs?  Do not leave any items on the stairs.  Make sure that there are handrails on both sides of the stairs and use them. Fix handrails that are broken or loose. Make sure that handrails are as long as the stairways.  Check any carpeting to make sure that it is firmly attached to the stairs. Fix any carpet that is loose or worn.  Avoid having throw rugs at the top or bottom of the stairs. If you do have throw rugs, attach them to the floor with carpet tape.  Make sure that you have a light switch at the top of the stairs and the bottom of the stairs. If you do not have them, ask someone to add them for you. What else can I do to help prevent falls?  Wear shoes that:  Do not have high heels.  Have rubber bottoms.  Are  comfortable and fit you well.  Are closed at the toe. Do not wear sandals.  If you use a stepladder:  Make sure that it is fully opened. Do not climb a closed stepladder.  Make sure that both sides of the stepladder are locked into place.  Ask someone to hold it for you, if possible.  Clearly mark and make sure that you can see:  Any grab bars or handrails.  First and last steps.  Where the edge of each step is.  Use tools that help you move around (mobility aids) if they are needed. These include:  Canes.  Walkers.  Scooters.  Crutches.  Turn on the lights when you go into a dark area. Replace any light bulbs as soon as they burn out.  Set up your furniture so you have a clear path. Avoid moving your furniture around.  If any of your floors are uneven, fix them.  If there are any pets around you, be aware of where they are.  Review your medicines with your doctor. Some medicines can make you feel dizzy. This can increase your chance of falling. Ask your doctor what other things that you can do to help prevent falls. This information is not intended to replace advice given to you by your health care provider. Make sure you discuss any questions you have with your health care provider. Document Released: 11/28/2008 Document Revised: 07/10/2015 Document Reviewed: 03/08/2014 Elsevier Interactive Patient Education  2017 [employer].

## 2019-05-04 ENCOUNTER — Other Ambulatory Visit: Payer: Self-pay

## 2019-05-04 ENCOUNTER — Other Ambulatory Visit (INDEPENDENT_AMBULATORY_CARE_PROVIDER_SITE_OTHER): Payer: Medicare Other

## 2019-05-04 DIAGNOSIS — E039 Hypothyroidism, unspecified: Secondary | ICD-10-CM | POA: Diagnosis not present

## 2019-05-04 DIAGNOSIS — I10 Essential (primary) hypertension: Secondary | ICD-10-CM

## 2019-05-04 DIAGNOSIS — R739 Hyperglycemia, unspecified: Secondary | ICD-10-CM

## 2019-05-04 DIAGNOSIS — E785 Hyperlipidemia, unspecified: Secondary | ICD-10-CM | POA: Diagnosis not present

## 2019-05-04 DIAGNOSIS — R809 Proteinuria, unspecified: Secondary | ICD-10-CM | POA: Diagnosis not present

## 2019-05-04 LAB — COMPREHENSIVE METABOLIC PANEL
ALT: 14 U/L (ref 0–53)
AST: 17 U/L (ref 0–37)
Albumin: 4.2 g/dL (ref 3.5–5.2)
Alkaline Phosphatase: 81 U/L (ref 39–117)
BUN: 15 mg/dL (ref 6–23)
CO2: 28 mEq/L (ref 19–32)
Calcium: 9 mg/dL (ref 8.4–10.5)
Chloride: 100 mEq/L (ref 96–112)
Creatinine, Ser: 1.04 mg/dL (ref 0.40–1.50)
GFR: 69.87 mL/min (ref >60.00)
Glucose, Bld: 100 mg/dL — ABNORMAL HIGH (ref 70–99)
Potassium: 3.6 mEq/L (ref 3.5–5.1)
Sodium: 138 mEq/L (ref 135–145)
Total Bilirubin: 0.7 mg/dL (ref 0.2–1.2)
Total Protein: 6.7 g/dL (ref 6.0–8.3)

## 2019-05-04 LAB — CBC WITH DIFFERENTIAL/PLATELET
Basophils Absolute: 0 10*3/uL (ref 0.0–0.1)
Basophils Relative: 0.4 % (ref 0.0–3.0)
Eosinophils Absolute: 0.2 10*3/uL (ref 0.0–0.7)
Eosinophils Relative: 3.6 % (ref 0.0–5.0)
HCT: 49.4 % (ref 39.0–52.0)
Hemoglobin: 17 g/dL (ref 13.0–17.0)
Lymphocytes Relative: 23.5 % (ref 12.0–46.0)
Lymphs Abs: 1.2 10*3/uL (ref 0.7–4.0)
MCHC: 34.4 g/dL (ref 30.0–36.0)
MCV: 94.5 fl (ref 78.0–100.0)
Monocytes Absolute: 0.5 10*3/uL (ref 0.1–1.0)
Monocytes Relative: 9.4 % (ref 3.0–12.0)
Neutro Abs: 3.3 10*3/uL (ref 1.4–7.7)
Neutrophils Relative %: 63.1 % (ref 43.0–77.0)
Platelets: 174 10*3/uL (ref 150.0–400.0)
RBC: 5.23 Mil/uL (ref 4.22–5.81)
RDW: 13 % (ref 11.5–15.5)
WBC: 5.2 10*3/uL (ref 4.0–10.5)

## 2019-05-04 LAB — LIPID PANEL
Cholesterol: 250 mg/dL — ABNORMAL HIGH (ref 0–200)
HDL: 47.9 mg/dL (ref >39.00)
NonHDL: 202.41
Total CHOL/HDL Ratio: 5
Triglycerides: 211 mg/dL — ABNORMAL HIGH (ref 0.0–149.0)
VLDL: 42.2 mg/dL — ABNORMAL HIGH (ref 0.0–40.0)

## 2019-05-04 LAB — TSH: TSH: 6.39 u[IU]/mL — ABNORMAL HIGH (ref 0.35–4.50)

## 2019-05-04 LAB — HEMOGLOBIN A1C: Hgb A1c MFr Bld: 5.6 % (ref 4.6–6.5)

## 2019-05-04 LAB — MICROALBUMIN / CREATININE URINE RATIO
Creatinine,U: 64.2 mg/dL
Microalb Creat Ratio: 41.9 mg/g — ABNORMAL HIGH (ref 0.0–30.0)
Microalb, Ur: 26.9 mg/dL — ABNORMAL HIGH (ref 0.0–1.9)

## 2019-05-04 LAB — LDL CHOLESTEROL, DIRECT: Direct LDL: 160 mg/dL

## 2019-05-04 NOTE — Addendum Note (Signed)
Addended by: Francis Dowse T on: 05/04/2019 09:12 AM   Modules accepted: Orders

## 2019-05-08 ENCOUNTER — Encounter: Payer: Self-pay | Admitting: Family Medicine

## 2019-05-08 ENCOUNTER — Ambulatory Visit (INDEPENDENT_AMBULATORY_CARE_PROVIDER_SITE_OTHER): Payer: Medicare Other | Admitting: Family Medicine

## 2019-05-08 VITALS — BP 135/75 | Ht 70.0 in | Wt 224.0 lb

## 2019-05-08 DIAGNOSIS — E039 Hypothyroidism, unspecified: Secondary | ICD-10-CM

## 2019-05-08 DIAGNOSIS — R739 Hyperglycemia, unspecified: Secondary | ICD-10-CM

## 2019-05-08 DIAGNOSIS — I10 Essential (primary) hypertension: Secondary | ICD-10-CM | POA: Diagnosis not present

## 2019-05-08 DIAGNOSIS — R809 Proteinuria, unspecified: Secondary | ICD-10-CM

## 2019-05-08 DIAGNOSIS — E785 Hyperlipidemia, unspecified: Secondary | ICD-10-CM

## 2019-05-08 MED ORDER — TADALAFIL 20 MG PO TABS: ORAL_TABLET | ORAL | 11 refills | Status: DC

## 2019-05-08 MED ORDER — AMLODIPINE-OLMESARTAN 10-40 MG PO TABS: 1.0000 | ORAL_TABLET | Freq: Every day | ORAL | 3 refills | Status: DC

## 2019-05-08 MED ORDER — LEVOTHYROXINE SODIUM 125 MCG PO TABS: ORAL_TABLET | ORAL | 3 refills | Status: DC

## 2019-05-08 MED ORDER — ACYCLOVIR 400 MG PO TABS: 400.0000 mg | ORAL_TABLET | Freq: Every day | ORAL | 1 refills | Status: DC

## 2019-05-08 MED ORDER — OMEPRAZOLE 20 MG PO CPDR: DELAYED_RELEASE_CAPSULE | ORAL | 3 refills | Status: DC

## 2019-05-08 MED ORDER — METOPROLOL SUCCINATE ER 50 MG PO TB24: ORAL_TABLET | ORAL | 3 refills | Status: DC

## 2019-05-08 NOTE — Progress Notes (Signed)
Phone [phone] Virtual visit via Video note   Subjective:  Chief complaint: Chief Complaint  Patient presents with  . Results    Labs    This visit type was conducted due to national recommendations for restrictions regarding the COVID-19 Pandemic (e.g. social distancing).  This format is felt to be most appropriate for this patient at this time balancing risks to patient and risks to population by having him in for in person visit.  No physical exam was performed (except for noted visual exam or audio findings with Telehealth visits).    Our team/I connected with [name] at  4:40 PM EDT by a video enabled telemedicine application (doxy.me or caregility through epic) and verified that I am speaking with the correct person using two identifiers.  Location patient: [facility] Location provider: [facility], office Persons participating in the virtual visit:  patient  Our team/I discussed the limitations of evaluation and management by telemedicine and the availability of in person appointments. In light of current covid-19 pandemic, patient also understands that we are trying to protect them by minimizing in office contact if at all possible.  The patient expressed consent for telemedicine visit and agreed to proceed. Patient understands insurance will be billed.   Past Medical History-  Patient Active Problem List   Diagnosis Date Noted  . Proteinuria 02/02/2018    Priority: Medium  . Hyperglycemia 03/22/2016    Priority: Medium  . Recurrent cold sores 01/07/2015    Priority: Medium  . Hypothyroid 04/28/2011    Priority: Medium  . Hyperlipidemia 12/09/2006    Priority: Medium  . Essential hypertension 08/18/2006    Priority: Medium  . Erectile dysfunction 01/07/2015    Priority: Low  . S/P right THA, AA 10/08/2014    Priority: Low  . Knee osteoarthritis 04/25/2014    Priority: Low  . GERD (gastroesophageal reflux disease) 08/03/2012    Priority: Low  .  Osteoarthritis 02/27/2010    Priority: Low  . Gout 08/18/2006    Priority: Low  . Personal history of rectal adenoma with high-grade dysplasia 08/18/2006    Priority: Low    Medications- reviewed and updated Current Outpatient Medications  Medication Sig Dispense Refill  . acyclovir (ZOVIRAX) 400 MG tablet Take 1 tablet (400 mg total) by mouth 5 (five) times daily. For 5 days maximum for cold sores 50 tablet 1  . amLODipine-olmesartan (AZOR) 10-40 MG tablet Take 1 tablet by mouth daily. 90 tablet 3  . glucosamine-chondroitin 500-400 MG tablet Take 2 tablets by mouth daily.    Marland Kitchen levothyroxine (SYNTHROID) 125 MCG tablet Take 1 tablet daily except for 2 tablets on sundays 110 tablet 3  . metoprolol succinate (TOPROL-XL) 50 MG 24 hr tablet Take with or immediately following a meal. Once daily 90 tablet 3  . Multiple Vitamin (MULTIVITAMIN WITH MINERALS) TABS tablet Take 1 tablet by mouth daily.    . NON FORMULARY Take 2 capsules by mouth daily. Vitamin to help lower cholesterol    . omeprazole (PRILOSEC) 20 MG capsule TAKE 1 CAPSULE BY MOUTH  DAILY 90 capsule 3  . Probiotic Product (PROBIOTIC DAILY PO) Take 1 tablet by mouth daily.    . tadalafil (CIALIS) 20 MG tablet TAKE 1 TABLET BY MOUTH EVERY OTHER DAY AS NEEDED FOR ERECTILE DYSFUNCTION. 10 tablet 11   No current facility-administered medications for this visit.     Objective:  BP 135/75   Ht 5\' 10"  (1.778 m)   Wt 224 lb (101.6 kg)  BMI 32.14 kg/m  self reported vitals Gen: NAD, resting comfortably Lungs: nonlabored, normal respiratory rate  Skin: appears dry, no obvious rash     Assessment and Plan   #hypertension S: compliant with  Amlodipine-olmesartan 10-40mg , metoprolol 50 mg XR BP Readings from Last 3 Encounters:  05/08/19 135/75  12/20/18 135/75  02/21/18 130/76  A/P: Stable. Continue current medications.   #hyperlipidemia S: compliant with a Takes a vitamin supplement and #s worsen off of it from [employer] Lab Results  Component Value Date   CHOL 250 (H) 05/04/2019   HDL 47.90 05/04/2019   LDLCALC 138 (H) 03/15/2016   LDLDIRECT 160.0 05/04/2019   TRIG 211.0 (H) 05/04/2019   CHOLHDL 5 05/04/2019   A/P: discussed a few options given 30% ascvd risks score yet prior statin issue on atorvastatin (Gynecomastia) 1. Pulse dose statin rosuvastatin 20mg  weekly 2. Zetia 3. Lifestyle changes - patient prefers to work on option #3 to start and recheck 6 months for CPE   # proteinuria wonder if this could be related to long term BP issues. Years ago Insurance denied him with trace protein. He feels like he stays somewhat dehydrated trying to drink more urine -microalbumin/cr ratio now >40:1- previously 30:1 or so- will repeat next visit  #hypothyroidism S: compliant On thyroid medication-levothyroxine 125 mcg  Lab Results  Component Value Date   TSH 6.39 (H) 05/04/2019   A/P: pooo control- will do 125 mcg six days a week and 250 mcg on Sunday and repeat at next visit  # a1c better at 5.6 down from 6.2- congratulated patient.   Recommended follow up: 6 month physical   Lab/Order associations:   ICD-10-CM   1. Essential hypertension  I10   2. Hyperlipidemia, unspecified hyperlipidemia type  E78.5   3. Hypothyroidism, unspecified type  E03.9   4. Hyperglycemia  R73.9   5. Proteinuria, unspecified type  R80.9     Meds ordered this encounter  Medications  . levothyroxine (SYNTHROID) 125 MCG tablet    Sig: Take 1 tablet daily except for 2 tablets on sundays    Dispense:  110 tablet    Refill:  3  . acyclovir (ZOVIRAX) 400 MG tablet    Sig: Take 1 tablet (400 mg total) by mouth 5 (five) times daily. For 5 days maximum for cold sores    Dispense:  50 tablet    Refill:  1  . amLODipine-olmesartan (AZOR) 10-40 MG tablet    Sig: Take 1 tablet by mouth daily.    Dispense:  90 tablet    Refill:  3  . metoprolol succinate (TOPROL-XL) 50 MG 24 hr tablet    Sig: Take with or  immediately following a meal. Once daily    Dispense:  90 tablet    Refill:  3  . omeprazole (PRILOSEC) 20 MG capsule    Sig: TAKE 1 CAPSULE BY MOUTH  DAILY    Dispense:  90 capsule    Refill:  3  . tadalafil (CIALIS) 20 MG tablet    Sig: TAKE 1 TABLET BY MOUTH EVERY OTHER DAY AS NEEDED FOR ERECTILE DYSFUNCTION.    Dispense:  10 tablet    Refill:  11   Return precautions advised.  [name], MD

## 2019-05-08 NOTE — Patient Instructions (Signed)
Health Maintenance Due  Topic Date Due  . COLONOSCOPY will address at next visit  08/04/2015    Depression screen [facility] 2/9 05/01/2019 12/20/2018 05/24/2017  Decreased Interest 0 0 0  Down, Depressed, Hopeless 0 0 0  PHQ - 2 Score 0 0 0    Recommended follow up: No follow-ups on file.

## 2019-05-12 DIAGNOSIS — M5136 Other intervertebral disc degeneration, lumbar region: Secondary | ICD-10-CM | POA: Diagnosis not present

## 2019-05-12 DIAGNOSIS — M9906 Segmental and somatic dysfunction of lower extremity: Secondary | ICD-10-CM | POA: Diagnosis not present

## 2019-05-12 DIAGNOSIS — M79672 Pain in left foot: Secondary | ICD-10-CM | POA: Diagnosis not present

## 2019-05-12 DIAGNOSIS — M9903 Segmental and somatic dysfunction of lumbar region: Secondary | ICD-10-CM | POA: Diagnosis not present

## 2019-05-22 ENCOUNTER — Other Ambulatory Visit: Payer: Self-pay | Admitting: Family Medicine

## 2019-05-23 ENCOUNTER — Other Ambulatory Visit: Payer: Self-pay

## 2019-05-23 ENCOUNTER — Other Ambulatory Visit: Payer: Self-pay | Admitting: Family Medicine

## 2019-05-23 MED ORDER — TADALAFIL 20 MG PO TABS: ORAL_TABLET | ORAL | 11 refills | Status: DC

## 2019-05-23 NOTE — Telephone Encounter (Signed)
Have pulled up script for you to resend to right pharmacy. You may want to check quantity to make sure that is how you want sent in. I have verified address with patient.

## 2019-05-23 NOTE — Telephone Encounter (Signed)
Patient is calling in asking if Dr.Hunter could re-send his prescriptions with his new current address, states the pharmacy will not send them out to him unless we update the address.

## 2019-07-06 ENCOUNTER — Other Ambulatory Visit: Payer: Self-pay | Admitting: Family Medicine

## 2019-08-06 ENCOUNTER — Other Ambulatory Visit: Payer: Self-pay | Admitting: Family Medicine

## 2019-08-07 ENCOUNTER — Other Ambulatory Visit: Payer: Self-pay | Admitting: Family Medicine

## 2019-08-23 ENCOUNTER — Telehealth: Payer: Self-pay | Admitting: Family Medicine

## 2019-08-23 ENCOUNTER — Other Ambulatory Visit: Payer: Self-pay

## 2019-08-23 DIAGNOSIS — E785 Hyperlipidemia, unspecified: Secondary | ICD-10-CM

## 2019-08-23 DIAGNOSIS — I10 Essential (primary) hypertension: Secondary | ICD-10-CM

## 2019-08-23 DIAGNOSIS — R809 Proteinuria, unspecified: Secondary | ICD-10-CM

## 2019-08-23 DIAGNOSIS — R739 Hyperglycemia, unspecified: Secondary | ICD-10-CM

## 2019-08-23 DIAGNOSIS — Z0000 Encounter for general adult medical examination without abnormal findings: Secondary | ICD-10-CM

## 2019-08-23 DIAGNOSIS — E039 Hypothyroidism, unspecified: Secondary | ICD-10-CM

## 2019-08-23 NOTE — Telephone Encounter (Signed)
Patient called and stated dr.hunter told him next time he was in town to get his blood work done,since he moved out of town. he will be back in town on 7/16 for his wife's appointment at [facility]. can you order his lab work and can you he do it at the [place] location ?

## 2019-08-23 NOTE — Telephone Encounter (Signed)
Orders placed, may call and schedule pt for labs at [place].

## 2019-08-27 ENCOUNTER — Other Ambulatory Visit: Payer: Medicare Other

## 2019-09-18 ENCOUNTER — Other Ambulatory Visit: Payer: Self-pay

## 2019-09-18 ENCOUNTER — Other Ambulatory Visit: Payer: Self-pay | Admitting: Family Medicine

## 2019-09-18 ENCOUNTER — Other Ambulatory Visit: Payer: Medicare Other

## 2019-09-18 DIAGNOSIS — Z0000 Encounter for general adult medical examination without abnormal findings: Secondary | ICD-10-CM

## 2019-09-18 DIAGNOSIS — E039 Hypothyroidism, unspecified: Secondary | ICD-10-CM

## 2019-09-18 DIAGNOSIS — E785 Hyperlipidemia, unspecified: Secondary | ICD-10-CM

## 2019-09-18 DIAGNOSIS — R809 Proteinuria, unspecified: Secondary | ICD-10-CM

## 2019-09-18 DIAGNOSIS — R739 Hyperglycemia, unspecified: Secondary | ICD-10-CM

## 2019-09-18 DIAGNOSIS — I10 Essential (primary) hypertension: Secondary | ICD-10-CM

## 2019-09-19 LAB — LIPID PANEL
Cholesterol: 244 mg/dL — ABNORMAL HIGH (ref <200)
HDL: 50 mg/dL (ref >=40)
LDL Cholesterol (Calc): 162 mg/dL (calc) — ABNORMAL HIGH
Non-HDL Cholesterol (Calc): 194 mg/dL (calc) — ABNORMAL HIGH (ref <130)
Total CHOL/HDL Ratio: 4.9 (calc) (ref <5.0)
Triglycerides: 167 mg/dL — ABNORMAL HIGH (ref <150)

## 2019-09-19 LAB — CBC WITH DIFFERENTIAL/PLATELET
Absolute Monocytes: 500 cells/uL (ref 200–950)
Basophils Absolute: 29 cells/uL (ref 0–200)
Basophils Relative: 0.6 %
Eosinophils Absolute: 211 cells/uL (ref 15–500)
Eosinophils Relative: 4.3 %
HCT: 50.3 % — ABNORMAL HIGH (ref 38.5–50.0)
Hemoglobin: 17.3 g/dL — ABNORMAL HIGH (ref 13.2–17.1)
Lymphs Abs: 1186 cells/uL (ref 850–3900)
MCH: 32.3 pg (ref 27.0–33.0)
MCHC: 34.4 g/dL (ref 32.0–36.0)
MCV: 93.8 fL (ref 80.0–100.0)
MPV: 10.3 fL (ref 7.5–12.5)
Monocytes Relative: 10.2 %
Neutro Abs: 2974 cells/uL (ref 1500–7800)
Neutrophils Relative %: 60.7 %
Platelets: 220 10*3/uL (ref 140–400)
RBC: 5.36 10*6/uL (ref 4.20–5.80)
RDW: 12.3 % (ref 11.0–15.0)
Total Lymphocyte: 24.2 %
WBC: 4.9 10*3/uL (ref 3.8–10.8)

## 2019-09-19 LAB — COMPREHENSIVE METABOLIC PANEL
AG Ratio: 1.8 (calc) (ref 1.0–2.5)
ALT: 11 U/L (ref 9–46)
AST: 18 U/L (ref 10–35)
Albumin: 4.2 g/dL (ref 3.6–5.1)
Alkaline phosphatase (APISO): 72 U/L (ref 35–144)
BUN: 20 mg/dL (ref 7–25)
CO2: 29 mmol/L (ref 20–32)
Calcium: 9.1 mg/dL (ref 8.6–10.3)
Chloride: 102 mmol/L (ref 98–110)
Creat: 1.15 mg/dL (ref 0.70–1.18)
Globulin: 2.3 g/dL (calc) (ref 1.9–3.7)
Glucose, Bld: 108 mg/dL — ABNORMAL HIGH (ref 65–99)
Potassium: 4.5 mmol/L (ref 3.5–5.3)
Sodium: 138 mmol/L (ref 135–146)
Total Bilirubin: 0.6 mg/dL (ref 0.2–1.2)
Total Protein: 6.5 g/dL (ref 6.1–8.1)

## 2019-09-19 LAB — MICROALBUMIN / CREATININE URINE RATIO
Creatinine, Urine: 96 mg/dL (ref 20–320)
Microalb Creat Ratio: 326 mcg/mg creat — ABNORMAL HIGH (ref <30)
Microalb, Ur: 31.3 mg/dL

## 2019-09-19 LAB — HEMOGLOBIN A1C
Hgb A1c MFr Bld: 5.6 % of total Hgb (ref <5.7)
Mean Plasma Glucose: 114 (calc)
eAG (mmol/L): 6.3 (calc)

## 2019-09-19 LAB — TSH: TSH: 1.94 mIU/L (ref 0.40–4.50)

## 2019-10-03 ENCOUNTER — Telehealth: Payer: Self-pay | Admitting: Family Medicine

## 2019-10-03 NOTE — Telephone Encounter (Signed)
See lab notes

## 2019-10-03 NOTE — Telephone Encounter (Signed)
Patient is returning call about lab results, asked if someone could give him a call back when someone is available

## 2019-10-04 ENCOUNTER — Telehealth: Payer: Self-pay

## 2019-10-04 ENCOUNTER — Other Ambulatory Visit: Payer: Self-pay

## 2019-10-04 MED ORDER — AMLODIPINE-OLMESARTAN 10-40 MG PO TABS: 1.0000 | ORAL_TABLET | Freq: Every day | ORAL | 3 refills | Status: DC

## 2019-10-04 NOTE — Telephone Encounter (Signed)
Refill called in. 

## 2019-10-04 NOTE — Telephone Encounter (Signed)
..   LAST APPOINTMENT DATE: 10/03/2019   NEXT APPOINTMENT DATE:@Visit  date not found  MEDICATION:amLODipine-olmesartan (AZOR) 10-40 MG tablet  metoprolol succinate (TOPROL-XL) 50 MG 24 hr tablet  Pt requests something that begins with MYLAN, but I do not see in chart  [place], [place], [address]    Okay for refill?  Please advise

## 2019-10-11 ENCOUNTER — Other Ambulatory Visit: Payer: Self-pay

## 2019-10-11 ENCOUNTER — Telehealth: Payer: Self-pay | Admitting: Family Medicine

## 2019-10-11 DIAGNOSIS — R944 Abnormal results of kidney function studies: Secondary | ICD-10-CM

## 2019-10-11 DIAGNOSIS — R809 Proteinuria, unspecified: Secondary | ICD-10-CM

## 2019-10-11 NOTE — Telephone Encounter (Signed)
Patients requesting referral to [facility] nephrology [phone] Fax# [phone] Facility is requesting last 4 blood work results and any other records pertaining to his kidneys

## 2019-10-11 NOTE — Telephone Encounter (Signed)
Referral placed.

## 2019-12-03 ENCOUNTER — Other Ambulatory Visit: Payer: Self-pay

## 2019-12-03 ENCOUNTER — Telehealth: Payer: Self-pay

## 2019-12-03 MED ORDER — ERYTHROMYCIN 5 MG/GM OP OINT
TOPICAL_OINTMENT | OPHTHALMIC | 0 refills | Status: DC
Start: 2019-12-03 — End: 2021-12-16

## 2019-12-03 NOTE — Telephone Encounter (Signed)
Rx sent in, called and made pt aware of below.

## 2019-12-03 NOTE — Telephone Encounter (Signed)
.   LAST APPOINTMENT DATE: 10/11/2019   NEXT APPOINTMENT DATE:@Visit  date not found  MEDICATION:erythromycin ophthalmic ointment 3.5 g   PHARMACY:[id number] - [place], [place] - [address] AT [facility]   **Let patient know to contact pharmacy at the end of the day to make sure medication is ready. **  ** Please notify patient to allow 48-72 hours to process**  **Encourage patient to contact the pharmacy for refills or they can request refills through [facility]**  CLINICAL FILLS OUT ALL BELOW:   LAST REFILL:  QTY:  REFILL DATE:    OTHER COMMENTS:    Okay for refill?  Please advise

## 2019-12-03 NOTE — Telephone Encounter (Signed)
May refill-follow-up with ophthalmology if stye does not improve-there is not a lot of strong data that erythromycin helped substantially for styes but on the other hand probably low risk for use.  Make sure to use warm compresses at least 4 times a day

## 2019-12-03 NOTE — Telephone Encounter (Signed)
[place] for refill? Not on current med list.

## 2020-01-25 ENCOUNTER — Other Ambulatory Visit: Payer: Self-pay | Admitting: Family Medicine

## 2020-07-07 ENCOUNTER — Telehealth: Payer: Self-pay

## 2020-07-07 MED ORDER — TADALAFIL 20 MG PO TABS: ORAL_TABLET | ORAL | 11 refills | Status: DC

## 2020-07-07 NOTE — Telephone Encounter (Signed)
  LAST APPOINTMENT DATE: 04/2019   NEXT APPOINTMENT DATE:08/12/2020  MEDICATION:tadalafil (CIALIS) 20 MG tablet  PHARMACY:[facility] - [place], [place] - [address]   Please advise

## 2020-07-07 NOTE — Telephone Encounter (Signed)
Refill sent.

## 2020-07-09 ENCOUNTER — Other Ambulatory Visit: Payer: Self-pay | Admitting: Family Medicine

## 2020-08-12 ENCOUNTER — Other Ambulatory Visit: Payer: Self-pay

## 2020-08-12 ENCOUNTER — Ambulatory Visit (INDEPENDENT_AMBULATORY_CARE_PROVIDER_SITE_OTHER): Payer: Medicare Other | Admitting: Family Medicine

## 2020-08-12 ENCOUNTER — Encounter: Payer: Self-pay | Admitting: Family Medicine

## 2020-08-12 VITALS — BP 146/82 | HR 47 | Temp 97.9°F | Ht 70.0 in | Wt 219.0 lb

## 2020-08-12 DIAGNOSIS — E785 Hyperlipidemia, unspecified: Secondary | ICD-10-CM | POA: Diagnosis not present

## 2020-08-12 DIAGNOSIS — I10 Essential (primary) hypertension: Secondary | ICD-10-CM

## 2020-08-12 DIAGNOSIS — R739 Hyperglycemia, unspecified: Secondary | ICD-10-CM

## 2020-08-12 DIAGNOSIS — R809 Proteinuria, unspecified: Secondary | ICD-10-CM

## 2020-08-12 DIAGNOSIS — E039 Hypothyroidism, unspecified: Secondary | ICD-10-CM

## 2020-08-12 LAB — COMPREHENSIVE METABOLIC PANEL WITH GFR
ALT: 14 U/L (ref 0–53)
AST: 18 U/L (ref 0–37)
Albumin: 4.4 g/dL (ref 3.5–5.2)
Alkaline Phosphatase: 63 U/L (ref 39–117)
BUN: 19 mg/dL (ref 6–23)
CO2: 26 meq/L (ref 19–32)
Calcium: 9.4 mg/dL (ref 8.4–10.5)
Chloride: 102 meq/L (ref 96–112)
Creatinine, Ser: 1.16 mg/dL (ref 0.40–1.50)
GFR: 61.85 mL/min
Glucose, Bld: 94 mg/dL (ref 70–99)
Potassium: 3.9 meq/L (ref 3.5–5.1)
Sodium: 137 meq/L (ref 135–145)
Total Bilirubin: 0.8 mg/dL (ref 0.2–1.2)
Total Protein: 7 g/dL (ref 6.0–8.3)

## 2020-08-12 LAB — CBC
HCT: 47.2 % (ref 39.0–52.0)
Hemoglobin: 16.4 g/dL (ref 13.0–17.0)
MCHC: 34.8 g/dL (ref 30.0–36.0)
MCV: 93 fl (ref 78.0–100.0)
Platelets: 214 K/uL (ref 150.0–400.0)
RBC: 5.07 Mil/uL (ref 4.22–5.81)
RDW: 13.4 % (ref 11.5–15.5)
WBC: 5.5 K/uL (ref 4.0–10.5)

## 2020-08-12 LAB — LDL CHOLESTEROL, DIRECT: Direct LDL: 150 mg/dL

## 2020-08-12 LAB — TSH: TSH: 1.42 u[IU]/mL (ref 0.35–4.50)

## 2020-08-12 LAB — HEMOGLOBIN A1C: Hgb A1c MFr Bld: 5.9 % (ref 4.6–6.5)

## 2020-08-12 MED ORDER — METOPROLOL SUCCINATE ER 50 MG PO TB24: ORAL_TABLET | ORAL | 3 refills | Status: DC

## 2020-08-12 MED ORDER — AMLODIPINE-OLMESARTAN 10-40 MG PO TABS: 1.0000 | ORAL_TABLET | Freq: Every day | ORAL | 3 refills | Status: DC

## 2020-08-12 MED ORDER — ACYCLOVIR 400 MG PO TABS: 400.0000 mg | ORAL_TABLET | Freq: Every day | ORAL | 1 refills | Status: AC

## 2020-08-12 MED ORDER — TADALAFIL 20 MG PO TABS: ORAL_TABLET | ORAL | 11 refills | Status: DC

## 2020-08-12 NOTE — Patient Instructions (Addendum)
We will certainly miss you - thank you for the opportunity to your physician! I wish you the best in [facility]  Please stop by lab before you go If you have mychart- we will send your results within 3 business days of [place] receiving them.  If you do not have mychart- we will call you about results within 5 business days of [place] receiving them.  *please also note that you will see labs on mychart as soon as they post. I will later go in and write notes on them- will say "notes from Dr. Yong Channel"

## 2020-08-12 NOTE — Progress Notes (Signed)
Phone [phone] In person visit   Subjective:   [name] is a 75 y.o. year old very pleasant male patient who presents for/with See problem oriented charting Chief Complaint  Patient presents with   Hypertension   Hyperlipidemia   Hypothyroidism   Hyperglycemia   This visit occurred during the SARS-CoV-2 public health emergency.  Safety protocols were in place, including screening questions prior to the visit, additional usage of staff PPE, and extensive cleaning of exam room while observing appropriate contact time as indicated for disinfecting solutions.   Past Medical History-  Patient Active Problem List   Diagnosis Date Noted   Proteinuria 02/02/2018    Priority: Medium   Hyperglycemia 03/22/2016    Priority: Medium   Recurrent cold sores 01/07/2015    Priority: Medium   Hypothyroid 04/28/2011    Priority: Medium   Hyperlipidemia 12/09/2006    Priority: Medium   Essential hypertension 08/18/2006    Priority: Medium   Erectile dysfunction 01/07/2015    Priority: Low   S/P right THA, AA 10/08/2014    Priority: Low   Knee osteoarthritis 04/25/2014    Priority: Low   GERD (gastroesophageal reflux disease) 08/03/2012    Priority: Low   Osteoarthritis 02/27/2010    Priority: Low   Gout 08/18/2006    Priority: Low   Personal history of rectal adenoma with high-grade dysplasia 08/18/2006    Priority: Low    Medications- reviewed and updated Current Outpatient Medications  Medication Sig Dispense Refill   erythromycin ophthalmic ointment One-half inch (1.25 cm) twice daily for 5 to 7 days to affected eye(s) 3.5 g 0   Famotidine (PEPCID PO) Take by mouth.     glucosamine-chondroitin 500-400 MG tablet Take 2 tablets by mouth daily.     levothyroxine (SYNTHROID) 125 MCG tablet TAKE 1 TABLET BY MOUTH  DAILY 90 tablet 3   Multiple Vitamin (MULTIVITAMIN WITH MINERALS) TABS tablet Take 1 tablet by mouth daily.     Probiotic Product (PROBIOTIC DAILY PO) Take 1  tablet by mouth daily.     acyclovir (ZOVIRAX) 400 MG tablet Take 1 tablet (400 mg total) by mouth 5 (five) times daily. For 5 days maximum for cold sores 50 tablet 1   amLODipine-olmesartan (AZOR) 10-40 MG tablet Take 1 tablet by mouth daily. 90 tablet 3   metoprolol succinate (TOPROL-XL) 50 MG 24 hr tablet TAKE 1 TABLET BY MOUTH  DAILY WITH OR IMMEDIATELY  FOLLOWING A MEAL 90 tablet 3   NON FORMULARY Take 2 capsules by mouth daily. Vitamin to help lower cholesterol     tadalafil (CIALIS) 20 MG tablet TAKE 1 TABLET BY MOUTH EVERY OTHER DAY AS NEEDED FOR ERECTILE DYSFUNCTION. 10 tablet 11   No current facility-administered medications for this visit.     Objective:  BP (!) 146/82 Comment: second recheck by Dr. 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 Stress on daughter. Living in [place]. Continues to work and travel Patient will be establishing  with new PCP in [place] getting set up with this through his kidney doctor after August visit  #Proteinuria- kidney doctor took him off aleve and reflux medicine. Has another visit in august- has some cysts on kidney. We had planned to recheck microalbumin/cr ratio but this will be evaluated with nephrology- just checked in January as well  #hypertension #CKD stage III-likely related to prior NSAIDs S: medication: compliant with amlodipine-olmesartan 10-40 mg, metoprolol 50 mg XR Home readings #s: 140s/60s or 70s.  BP Readings from Last 3 Encounters:  08/12/20 (!) 146/82  05/08/19 135/75  12/20/18 135/75   A/P: Mild poor control of hypertension-CKD appears stable on labs from [place].  I am hesitant to add diuretic with CKD stage III-nephrology appear to be okay with blood pressure in the 140s and patient would prefer not to change medications-we will continue current medications for now  #hyperlipidemia S: Medication: takes a vitamin supplement and numbers will worsen if off of it- from [facility] per patient report. Limited steak once a month and perhaps 2 hamburgers- does mainly chicken - Patient with gynecomastia on atorvastatin Lab Results  Component Value Date   CHOL 244 (H) 09/18/2019   HDL 50 09/18/2019   LDLCALC 162 (H) 09/18/2019   LDLDIRECT 160.0 05/04/2019   TRIG 167 (H) 09/18/2019   CHOLHDL 4.9 54/65/6812   A/P: LDL certainly above goal of 70 or less-we opted to update direct LDL today  Previously we discussed the following options and discussed again today "1. Pulse dose statin rosuvastatin 20mg  weekly 2. Zetia 3. Lifestyle changes"  He previously opted for lifestyle changes-patient has lost 5 pounds since last visit and congratulated him. Planning to start with the gym.  We will update direct LDL and reevaluate today   #hypothyroidism S: compliant On thyroid medication- levothyroxin 125 mcg Lab Results  Component Value Date   TSH 1.94 09/18/2019  A/P: hopefully stable- update TSH today. Continue current meds for now    # Hyperglycemia/insulin resistance/prediabetes S:  Medication:  none Exercise and diet- working on this- has lost weight Lab Results  Component Value Date   HGBA1C 5.6 09/18/2019   HGBA1C 5.6 05/04/2019   HGBA1C 6.2 05/24/2017   A/P: he has done very well on recent checks- update a1c with labs today   #Polycythemia-has bene noted intermittently- will check again today- does snore- not a lot of daytime sleepiness- do wonder about sleep apnea as cause- could consider referral to where he is for sleep apnea testing in [facility]  Lab  Results  Component Value Date   WBC 4.9 09/18/2019   HGB 17.3 (H) 09/18/2019   HCT 50.3 (H) 09/18/2019   MCV 93.8 09/18/2019   PLT 220 09/18/2019   # GERD S:Medication: on pepcid now per nephrology-  Omeprazole 20 mg previously but not ideal for kidneys A/P: reasonable control- continue current meds   # Erectile Dysfunction S: medications:  Cialis 20 mg A/P: Reasonable control-continue current medication   Recommended follow up: Return in about 6 months (around 02/11/2021) for physical or sooner if needed or try to complete this with new PCP when established. No future appointments.   Lab/Order associations:   ICD-10-CM   1. Hyperlipidemia, unspecified hyperlipidemia type  E78.5 Comp Met (CMET)    CBC    LDL cholesterol, direct    2. Hypothyroidism, unspecified type  E03.9 TSH    3. Essential hypertension  I10     4. Hyperglycemia  R73.9 HgB A1c  5. Proteinuria, unspecified type  R80.9       Meds ordered this encounter  Medications   amLODipine-olmesartan (AZOR) 10-40 MG tablet    Sig: Take 1 tablet by mouth daily.    Dispense:  90 tablet    Refill:  3    Requesting 1 year supply   metoprolol succinate (TOPROL-XL) 50 MG 24 hr tablet    Sig: TAKE 1 TABLET BY MOUTH  DAILY WITH OR IMMEDIATELY  FOLLOWING A MEAL    Dispense:  90 tablet    Refill:  3    Requesting 1 year supply   tadalafil (CIALIS) 20 MG tablet    Sig: TAKE 1 TABLET BY MOUTH EVERY OTHER DAY AS NEEDED FOR ERECTILE DYSFUNCTION.    Dispense:  10 tablet    Refill:  11   acyclovir (ZOVIRAX) 400 MG tablet    Sig: Take 1 tablet (400 mg total) by mouth 5 (five) times daily. For 5 days maximum for cold sores    Dispense:  50 tablet    Refill:  1    I,Harris Phan,acting as a scribe for [name], MD.,have documented all relevant documentation on the behalf of [name], MD,as directed by  [name], MD while in the presence of [name], MD.   I, [name], MD, have reviewed all  documentation for this visit. The documentation on 08/12/20 for the exam, diagnosis, procedures, and orders are all accurate and complete.   Return precautions advised.  [name], MD

## 2021-03-15 ENCOUNTER — Encounter: Payer: Self-pay | Admitting: Family Medicine

## 2021-06-11 ENCOUNTER — Other Ambulatory Visit: Payer: Self-pay | Admitting: Family Medicine

## 2021-09-10 ENCOUNTER — Telehealth: Payer: Self-pay | Admitting: Family Medicine

## 2021-09-17 MED ORDER — METOPROLOL SUCCINATE ER 50 MG PO TB24: ORAL_TABLET | ORAL | 3 refills | Status: DC

## 2021-09-17 MED ORDER — AMLODIPINE-OLMESARTAN 10-40 MG PO TABS: 1.0000 | ORAL_TABLET | Freq: Every day | ORAL | 3 refills | Status: DC

## 2021-09-17 NOTE — Telephone Encounter (Signed)
Mediations refilled.

## 2021-09-17 NOTE — Telephone Encounter (Signed)
Patient states: -Received an email from [facility] stating Metoprolol and Amlodipine could not be filled without the provider signing off on the medications  Patient requests: -These medications be signed off on so he can have them filled prior to traveling.

## 2021-11-09 ENCOUNTER — Encounter: Payer: Self-pay | Admitting: *Deleted

## 2021-12-16 ENCOUNTER — Encounter: Payer: Self-pay | Admitting: Family Medicine

## 2021-12-16 ENCOUNTER — Ambulatory Visit (INDEPENDENT_AMBULATORY_CARE_PROVIDER_SITE_OTHER): Payer: Medicare Other | Admitting: Family Medicine

## 2021-12-16 DIAGNOSIS — R739 Hyperglycemia, unspecified: Secondary | ICD-10-CM

## 2021-12-16 DIAGNOSIS — E039 Hypothyroidism, unspecified: Secondary | ICD-10-CM | POA: Diagnosis not present

## 2021-12-16 DIAGNOSIS — R809 Proteinuria, unspecified: Secondary | ICD-10-CM | POA: Diagnosis not present

## 2021-12-16 DIAGNOSIS — E785 Hyperlipidemia, unspecified: Secondary | ICD-10-CM

## 2021-12-16 LAB — COMPREHENSIVE METABOLIC PANEL
ALT: 13 U/L (ref 0–53)
AST: 18 U/L (ref 0–37)
Albumin: 4.3 g/dL (ref 3.5–5.2)
Alkaline Phosphatase: 64 U/L (ref 39–117)
BUN: 16 mg/dL (ref 6–23)
CO2: 27 mEq/L (ref 19–32)
Calcium: 9.3 mg/dL (ref 8.4–10.5)
Chloride: 102 mEq/L (ref 96–112)
Creatinine, Ser: 1.09 mg/dL (ref 0.40–1.50)
GFR: 66.02 mL/min (ref >60.00)
Glucose, Bld: 110 mg/dL — ABNORMAL HIGH (ref 70–99)
Potassium: 4.1 mEq/L (ref 3.5–5.1)
Sodium: 136 mEq/L (ref 135–145)
Total Bilirubin: 0.5 mg/dL (ref 0.2–1.2)
Total Protein: 7 g/dL (ref 6.0–8.3)

## 2021-12-16 LAB — LIPID PANEL
Cholesterol: 204 mg/dL — ABNORMAL HIGH (ref 0–200)
HDL: 52.9 mg/dL (ref >39.00)
LDL Cholesterol: 123 mg/dL — ABNORMAL HIGH (ref 0–99)
NonHDL: 151.18
Total CHOL/HDL Ratio: 4
Triglycerides: 142 mg/dL (ref 0.0–149.0)
VLDL: 28.4 mg/dL (ref 0.0–40.0)

## 2021-12-16 LAB — HEMOGLOBIN A1C: Hgb A1c MFr Bld: 5.8 % (ref 4.6–6.5)

## 2021-12-16 LAB — TSH: TSH: 0.21 u[IU]/mL — ABNORMAL LOW (ref 0.35–5.50)

## 2021-12-16 LAB — CBC WITH DIFFERENTIAL/PLATELET
Basophils Absolute: 0 10*3/uL (ref 0.0–0.1)
Basophils Relative: 0.6 % (ref 0.0–3.0)
Eosinophils Absolute: 0.2 10*3/uL (ref 0.0–0.7)
Eosinophils Relative: 4.6 % (ref 0.0–5.0)
HCT: 47.9 % (ref 39.0–52.0)
Hemoglobin: 16.3 g/dL (ref 13.0–17.0)
Lymphocytes Relative: 20.3 % (ref 12.0–46.0)
Lymphs Abs: 1 10*3/uL (ref 0.7–4.0)
MCHC: 34 g/dL (ref 30.0–36.0)
MCV: 93.9 fl (ref 78.0–100.0)
Monocytes Absolute: 0.5 10*3/uL (ref 0.1–1.0)
Monocytes Relative: 10.4 % (ref 3.0–12.0)
Neutro Abs: 3.1 10*3/uL (ref 1.4–7.7)
Neutrophils Relative %: 64.1 % (ref 43.0–77.0)
Platelets: 221 10*3/uL (ref 150.0–400.0)
RBC: 5.11 Mil/uL (ref 4.22–5.81)
RDW: 12.9 % (ref 11.5–15.5)
WBC: 4.8 10*3/uL (ref 4.0–10.5)

## 2021-12-16 NOTE — Progress Notes (Signed)
Phone [phone] In person visit   Subjective:   [name] is a 76 y.o. year old very pleasant male patient who presents for/with See problem oriented charting Chief Complaint  Patient presents with   Follow-up    Pt wants to discuss Pneumonia shot and also was told no more colonoscopies.   Hyperlipidemia   Hypertension   Hypothyroidism   Past Medical History-  Patient Active Problem List   Diagnosis Date Noted   Proteinuria 02/02/2018    Priority: Medium    Hyperglycemia 03/22/2016    Priority: Medium    Recurrent cold sores 01/07/2015    Priority: Medium    Hypothyroid 04/28/2011    Priority: Medium    Hyperlipidemia 12/09/2006    Priority: Medium    Essential hypertension 08/18/2006    Priority: Medium    Erectile dysfunction 01/07/2015    Priority: Low   S/P right THA, AA 10/08/2014    Priority: Low   Knee osteoarthritis 04/25/2014    Priority: Low   GERD (gastroesophageal reflux disease) 08/03/2012    Priority: Low   Osteoarthritis 02/27/2010    Priority: Low   Gout 08/18/2006    Priority: Low   Personal history of rectal adenoma with high-grade dysplasia 08/18/2006    Priority: Low    Medications- reviewed and updated Current Outpatient Medications  Medication Sig Dispense Refill   acyclovir (ZOVIRAX) 400 MG tablet Take 1 tablet (400 mg total) by mouth 5 (five) times daily. For 5 days maximum for cold sores 50 tablet 1   amLODipine-olmesartan (AZOR) 10-40 MG tablet Take 1 tablet by mouth daily. 90 tablet 3   Famotidine (PEPCID PO) Take by mouth.     glucosamine-chondroitin 500-400 MG tablet Take 2 tablets by mouth daily.     levothyroxine (SYNTHROID) 125 MCG tablet TAKE 1 TABLET BY MOUTH  DAILY 90 tablet 3   metoprolol succinate (TOPROL-XL) 50 MG 24 hr tablet TAKE 1 TABLET BY MOUTH  DAILY WITH OR IMMEDIATELY  FOLLOWING A MEAL 90 tablet 3   Multiple Vitamin (MULTIVITAMIN WITH MINERALS) TABS tablet Take 1 tablet by mouth daily.     NON FORMULARY  Take 2 capsules by mouth daily. Vitamin to help lower cholesterol     Probiotic Product (PROBIOTIC DAILY PO) Take 1 tablet by mouth daily.     tadalafil (CIALIS) 20 MG tablet TAKE 1 TABLET BY MOUTH EVERY OTHER DAY AS NEEDED FOR ERECTILE DYSFUNCTION. 10 tablet 11   erythromycin ophthalmic ointment One-half inch (1.25 cm) twice daily for 5 to 7 days to affected eye(s) 3.5 g 0   No current facility-administered medications for this visit.     Objective:  BP 130/76   Pulse (!) 57   Temp 97.6 F (36.4 C)   Wt 212 lb 9.6 oz (96.4 kg)   SpO2 96%   BMI 30.50 kg/m  Gen: NAD, resting comfortably CV: RRR no murmurs rubs or gallops Lungs: CTAB no crackles, wheeze, rhonchi Abdomen: soft/nontender/nondistended/normal bowel sounds. No rebound or guarding.  Ext: no edema Skin: warm, dry Neuro: grossly normal, moves all extremities      Assessment and Plan   #Social update-son-in-law with massive stroke at age 38 reported last year-he reports only able to work 60% or so with fatigue- hard on her. His daughter is doing well on MS medication.  - Patient continues to work and travel-he is living in [facility].  He had plan to establish with new PCP  #Health maintenance discussion-patient asks about  Prevnar 20-we discussed he has had both Prevnar 13 and Pneumovax 23 but still has an option of Prevnar 20-ultimately he opted out. He did get hospitalized with PNA in past 20 years ago.  -Declines flu shot-severe reaction hospitalized 4 days years ago-added to allergy list -wants to hold off on RSV  -plans on covid shot Immunization History  Administered Date(s) Administered   Hepatitis A 03/03/2002   Hepatitis B 03/03/2002   PFIZER(Purple Top)SARS-COV-2 Vaccination 03/28/2019, 04/25/2019   Pneumococcal Conjugate-13 04/25/2014   Pneumococcal Polysaccharide-23 08/30/2012   Td 03/01/2002   Tdap 08/30/2012   #History of melanoma in situ-followed at [facility]-originally  detected May 12, 2021 on left upper chest with routine examination-ultimately had melanoma in situ with negative margins-plan for dermatology follow-up every 3 months for 2 years per notes October 16, 2021  #hypertension S: medication: Amlodipine-olmesartan 10-40 mg, metoprolol 50 mg extended release -Gout on hydrochlorothiazide in the past Home readings #s: similar readings to today BP Readings from Last 3 Encounters:  12/16/21 130/76  08/12/20 (!) 146/82  05/08/19 135/75  A/P: Controlled. Continue current medications.   #proteinuria -In regards to prior proteinuria was set up with nephrology in  [place]-NSAIDs were removed -thinks may be with [facility] nephrology- [facility] [place]- see scan 04/20/21 with visit on that date  #hyperlipidemia S: Medication:none  -Gynecomastia in the past on atorvastatin  Previously we discussed the following options and discussed again today "1. Pulse dose statin rosuvastatin '20mg'$  weekly 2. Zetia 3. Lifestyle changes" The 10-year ASCVD risk score (Arnett DK, et al., 2019) is: 32.4% in 2023 - down 7 lbs in last year- wants to rehcheck- tries to eat well improve types of food plus cut portion sizes. Gym typically 3 days a week for an hour A/P: hopefully improve-d update lipids- with prior reaction wants to hold off on statin.  -mentioned ct calcium scoring- wants to hold off fo rnow   #hypothyroidism S: compliant On thyroid medication-levothyroxine 125 mcg Lab Results  Component Value Date   TSH 1.42 08/12/2020  A/P:hopefully stable- update tsh today. Continue current meds for now    # Hyperglycemia/insulin resistance/prediabetes-A1c up to 5.9 in the past in 2022 S:  Medication: None Exercise and diet- see above under HLD Lab Results  Component Value Date   HGBA1C 5.9 08/12/2020   HGBA1C 5.6 09/18/2019   HGBA1C 5.6 05/04/2019   A/P: hopefully stable OR improved- update a1c today. Continue current meds for now  #Erectile  dysfunction-tadalafil as needed - helpful   #Recurrent cold sores-uses acyclovir as needed- better lately  #History of rectal adenoma with high-grade dysplasia iin past- 2014 colonoscopy without evidence-he reports has been told needs no further colonoscopy as of 2023 but I cannot confirm that information- recommended calling GI to rescheudle or establishing in [place]   From last note Dr. Carlean Purl "2-3 colon adenomas max 10 mm Fundic gland gastric polyps" and stated repeat 07/2015- we tried to refer twice in 2017 and 2019- encouraged to schedule in [facility]- would be more convenient   Recommended follow up: Return in about 1 year (around 12/17/2022) for physical or sooner if needed.Schedule b4 you leave.  Lab/Order associations:   ICD-10-CM   1. Hypothyroidism, unspecified type  E03.9 TSH    2. Hyperlipidemia, unspecified hyperlipidemia type  E78.5 CBC with Differential/Platelet    Comprehensive metabolic panel    Lipid panel    3. Hyperglycemia  R73.9 HgB A1c    4. Proteinuria, unspecified type  R80.9  No orders of the defined types were placed in this encounter.   Return precautions advised.  [name], MD

## 2021-12-16 NOTE — Patient Instructions (Addendum)
Health Maintenance Due  Topic Date Due   COLONOSCOPY  -get scheduled in [facility]- gave you pathology and colonoscopy report as well as Dr. Carlean Purl recommendations- overdue from 2017 recommendations 08/04/2015   Medicare Annual Wellness (AWV)  04/30/2020  You are eligible to schedule your annual wellness visit with our nurse specialist Otila Kluver.  Please consider scheduling this before you leave today  Please stop by lab before you go If you have mychart- we will send your results within 3 business days of [place] receiving them.  If you do not have mychart- we will call you about results within 5 business days of [place] receiving them.  *please also note that you will see labs on mychart as soon as they post. I will later go in and write notes on them- will say "notes from Dr. Yong Channel"   Recommended follow up: Return in about 1 year (around 12/17/2022) for physical or sooner if needed.Schedule b4 you leave.

## 2021-12-17 ENCOUNTER — Ambulatory Visit: Payer: Medicare Other | Admitting: Family Medicine

## 2021-12-18 ENCOUNTER — Encounter: Payer: Self-pay | Admitting: Family Medicine

## 2021-12-21 ENCOUNTER — Other Ambulatory Visit: Payer: Self-pay

## 2021-12-21 DIAGNOSIS — E039 Hypothyroidism, unspecified: Secondary | ICD-10-CM

## 2021-12-21 MED ORDER — LEVOTHYROXINE SODIUM 112 MCG PO TABS: 112.0000 ug | ORAL_TABLET | Freq: Every day | ORAL | 0 refills | Status: DC

## 2021-12-21 NOTE — Progress Notes (Signed)
Vml for patient to call back and sch 6 week TSH lab apt only

## 2021-12-23 ENCOUNTER — Other Ambulatory Visit: Payer: Self-pay

## 2021-12-23 DIAGNOSIS — E039 Hypothyroidism, unspecified: Secondary | ICD-10-CM

## 2022-03-09 ENCOUNTER — Encounter: Payer: Self-pay | Admitting: Family Medicine

## 2022-03-18 ENCOUNTER — Other Ambulatory Visit: Payer: Self-pay | Admitting: Family Medicine

## 2022-03-25 ENCOUNTER — Telehealth: Payer: Self-pay | Admitting: Family Medicine

## 2022-03-25 MED ORDER — METOPROLOL SUCCINATE ER 50 MG PO TB24: ORAL_TABLET | ORAL | 3 refills | Status: DC

## 2022-03-25 MED ORDER — AMLODIPINE-OLMESARTAN 10-40 MG PO TABS: 1.0000 | ORAL_TABLET | Freq: Every day | ORAL | 3 refills | Status: DC

## 2022-03-25 NOTE — Telephone Encounter (Signed)
Patient states: -He has been having a hard time getting medications from [facility].    Patient requests:  -PCP send in a refill of amLODipine-olmesartan (AZOR) 10-40 MG tablet  and  metoprolol succinate (TOPROL-XL) 50 MG 24 hr tablet  to [facility] on [address].

## 2022-03-25 NOTE — Telephone Encounter (Signed)
Rx refilled.

## 2022-03-26 ENCOUNTER — Other Ambulatory Visit: Payer: Self-pay | Admitting: Family Medicine

## 2022-03-26 NOTE — Telephone Encounter (Signed)
Error

## 2022-03-27 LAB — TSH: TSH: 3.43 u[IU]/mL (ref 0.450–4.500)

## 2022-05-10 ENCOUNTER — Telehealth: Payer: Self-pay | Admitting: Family Medicine

## 2022-05-10 NOTE — Telephone Encounter (Signed)
Contacted [name] to schedule their annual wellness visit. Patient declined to schedule AWV at this time.  DO NOT CALL FOR AWV...  [facility] Dial [phone]

## 2022-05-21 LAB — LAB REPORT - SCANNED: EGFR: 68.1

## 2022-06-15 ENCOUNTER — Other Ambulatory Visit: Payer: Self-pay | Admitting: Family Medicine

## 2022-06-23 LAB — LAB REPORT - SCANNED: EGFR: 62.7

## 2022-09-07 ENCOUNTER — Telehealth: Payer: Self-pay | Admitting: *Deleted

## 2022-09-07 NOTE — Telephone Encounter (Signed)
I connected with Bernita Raisin on 7/23 at [phone] by telephone and verified that I am speaking with the correct person using two identifiers. According to the patient's chart they are due for physical in NOV with [facility]. Pt will call back to schedule. He and his wife normally come together and they drive two hours to get to office.  Nothing further was needed at the end of our conversation.

## 2022-09-17 ENCOUNTER — Other Ambulatory Visit: Payer: Self-pay | Admitting: Family Medicine

## 2022-12-20 ENCOUNTER — Other Ambulatory Visit: Payer: Self-pay | Admitting: Family Medicine

## 2023-01-24 ENCOUNTER — Encounter: Payer: Self-pay | Admitting: Family Medicine

## 2023-01-24 ENCOUNTER — Ambulatory Visit (INDEPENDENT_AMBULATORY_CARE_PROVIDER_SITE_OTHER): Payer: Medicare HMO | Admitting: Family Medicine

## 2023-01-24 VITALS — BP 138/78 | HR 50 | Temp 97.2°F | Ht 70.0 in | Wt 217.2 lb

## 2023-01-24 DIAGNOSIS — E039 Hypothyroidism, unspecified: Secondary | ICD-10-CM | POA: Diagnosis not present

## 2023-01-24 DIAGNOSIS — E785 Hyperlipidemia, unspecified: Secondary | ICD-10-CM | POA: Diagnosis not present

## 2023-01-24 DIAGNOSIS — Z131 Encounter for screening for diabetes mellitus: Secondary | ICD-10-CM

## 2023-01-24 DIAGNOSIS — I10 Essential (primary) hypertension: Secondary | ICD-10-CM

## 2023-01-24 DIAGNOSIS — R739 Hyperglycemia, unspecified: Secondary | ICD-10-CM | POA: Diagnosis not present

## 2023-01-24 DIAGNOSIS — Z0000 Encounter for general adult medical examination without abnormal findings: Secondary | ICD-10-CM | POA: Diagnosis not present

## 2023-01-24 LAB — COMPREHENSIVE METABOLIC PANEL
ALT: 12 U/L (ref 0–53)
AST: 17 U/L (ref 0–37)
Albumin: 4.2 g/dL (ref 3.5–5.2)
Alkaline Phosphatase: 68 U/L (ref 39–117)
BUN: 24 mg/dL — ABNORMAL HIGH (ref 6–23)
CO2: 23 meq/L (ref 19–32)
Calcium: 9.2 mg/dL (ref 8.4–10.5)
Chloride: 103 meq/L (ref 96–112)
Creatinine, Ser: 1.28 mg/dL (ref 0.40–1.50)
GFR: 54.02 mL/min — ABNORMAL LOW (ref >60.00)
Glucose, Bld: 99 mg/dL (ref 70–99)
Potassium: 4.1 meq/L (ref 3.5–5.1)
Sodium: 140 meq/L (ref 135–145)
Total Bilirubin: 0.6 mg/dL (ref 0.2–1.2)
Total Protein: 6.6 g/dL (ref 6.0–8.3)

## 2023-01-24 LAB — CBC WITH DIFFERENTIAL/PLATELET
Basophils Absolute: 0 10*3/uL (ref 0.0–0.1)
Basophils Relative: 0.6 % (ref 0.0–3.0)
Eosinophils Absolute: 0.1 10*3/uL (ref 0.0–0.7)
Eosinophils Relative: 2.5 % (ref 0.0–5.0)
HCT: 48.5 % (ref 39.0–52.0)
Hemoglobin: 16.2 g/dL (ref 13.0–17.0)
Lymphocytes Relative: 20.5 % (ref 12.0–46.0)
Lymphs Abs: 1.1 10*3/uL (ref 0.7–4.0)
MCHC: 33.3 g/dL (ref 30.0–36.0)
MCV: 95.8 fL (ref 78.0–100.0)
Monocytes Absolute: 0.5 10*3/uL (ref 0.1–1.0)
Monocytes Relative: 8.8 % (ref 3.0–12.0)
Neutro Abs: 3.6 10*3/uL (ref 1.4–7.7)
Neutrophils Relative %: 67.6 % (ref 43.0–77.0)
Platelets: 213 10*3/uL (ref 150.0–400.0)
RBC: 5.07 Mil/uL (ref 4.22–5.81)
RDW: 13.2 % (ref 11.5–15.5)
WBC: 5.4 10*3/uL (ref 4.0–10.5)

## 2023-01-24 LAB — LIPID PANEL
Cholesterol: 224 mg/dL — ABNORMAL HIGH (ref 0–200)
HDL: 48.9 mg/dL (ref >39.00)
LDL Cholesterol: 156 mg/dL — ABNORMAL HIGH (ref 0–99)
NonHDL: 175.01
Total CHOL/HDL Ratio: 5
Triglycerides: 96 mg/dL (ref 0.0–149.0)
VLDL: 19.2 mg/dL (ref 0.0–40.0)

## 2023-01-24 LAB — HEMOGLOBIN A1C: Hgb A1c MFr Bld: 5.8 % (ref 4.6–6.5)

## 2023-01-24 LAB — TSH: TSH: 3.45 u[IU]/mL (ref 0.35–5.50)

## 2023-01-24 MED ORDER — TADALAFIL 20 MG PO TABS: ORAL_TABLET | ORAL | 11 refills | Status: AC

## 2023-01-24 NOTE — Progress Notes (Signed)
Phone: [phone]   Subjective:  Patient presents today for their annual physical. Chief complaint-noted.   See problem oriented charting- ROS- full  review of systems was completed and negative  Per full ROS sheet completed by patient other than got dizzy once the other day- had put up 6 christmas trees and hanging garland- and then when he bent over and stood back up towards end of that felt mildly lightheaded. May have been mildly dehydrated  The following were reviewed and entered/updated in epic: Past Medical History:  Diagnosis Date   Abnormal mammogram 04/03/2008   L side- lipitor related, resolved off medication     Arthritis    Benign fundic gland polyps of stomach    Cancer (HCC)    jaw removal due to cancer- 40 years ago    GERD (gastroesophageal reflux disease)    GI bleed 2005   after colonoscopy   Gout    HTN (hypertension)    Hyperlipidemia    Hypothyroidism    Irregular bowel habits    occ. irregular bowel elimination   Personal history of rectal adenoma with high-grade dysplasia and colonic adenomas    Patient Active Problem List   Diagnosis Date Noted   Proteinuria 02/02/2018    Priority: Medium    Hyperglycemia 03/22/2016    Priority: Medium    Recurrent cold sores 01/07/2015    Priority: Medium    Hypothyroid 04/28/2011    Priority: Medium    Hyperlipidemia 12/09/2006    Priority: Medium    Essential hypertension 08/18/2006    Priority: Medium    Erectile dysfunction 01/07/2015    Priority: Low   S/P right THA, AA 10/08/2014    Priority: Low   Knee osteoarthritis 04/25/2014    Priority: Low   GERD (gastroesophageal reflux disease) 08/03/2012    Priority: Low   Osteoarthritis 02/27/2010    Priority: Low   Gout 08/18/2006    Priority: Low   History of colonic polyps 08/18/2006    Priority: Low   Past Surgical History:  Procedure Laterality Date   COLONOSCOPY     JOINT REPLACEMENT Right 09/2013   left knee0 09/2013, right knee- 2011     KNEE ARTHROSCOPY  2011   Dr. Shelle Iron   KNEE ARTHROSCOPY WITH MEDIAL MENISECTOMY Left 06/29/2013   Procedure: LEFT KNEE ARTHROSCOPY WITH DEBRIDEMENT AND A PARTIAL MEDIAL MENISECTOMY;  Surgeon: Javier Docker, MD;  Location: [facility];  Service: Orthopedics;  Laterality: Left;   TONSILLECTOMY     TOTAL HIP ARTHROPLASTY Right 10/08/2014   Procedure: RIGHT TOTAL HIP ARTHROPLASTY ANTERIOR APPROACH;  Surgeon: Durene Romans, MD;  Location: [facility];  Service: Orthopedics;  Laterality: Right;   TOTAL KNEE ARTHROPLASTY  6/11, 09/2013   Dr. Shelle Iron   TOTAL KNEE ARTHROPLASTY Left 09/28/2013   Procedure: LEFT TOTAL KNEE ARTHROPLASTY;  Surgeon: Javier Docker, MD;  Location: [facility];  Service: Orthopedics;  Laterality: Left;   TUMOR REMOVAL  1984   from Jaw-R jaw-large dissectoin-bone from hip in jaw   UPPER GASTROINTESTINAL ENDOSCOPY      Family History  Problem Relation Age of Onset   Stroke Mother    Diabetes Mother    Colon cancer Neg Hx     Medications- reviewed and updated Current Outpatient Medications  Medication Sig Dispense Refill   acyclovir (ZOVIRAX) 400 MG tablet Take 1 tablet (400 mg total) by mouth 5 (five) times daily. For 5 days maximum for cold sores 50 tablet 1   amLODipine-olmesartan (AZOR)  10-40 MG tablet Take 1 tablet by mouth daily. 90 tablet 3   celecoxib (CELEBREX) 200 MG capsule Take 200 mg by mouth daily.     Famotidine (PEPCID PO) Take by mouth.     glucosamine-chondroitin 500-400 MG tablet Take 2 tablets by mouth daily.     levothyroxine (SYNTHROID) 112 MCG tablet TAKE 1 TABLET BY MOUTH EVERY DAY 90 tablet 0   metoprolol succinate (TOPROL-XL) 50 MG 24 hr tablet TAKE 1 TABLET BY MOUTH  DAILY WITH OR IMMEDIATELY  FOLLOWING A MEAL 90 tablet 3   Multiple Vitamin (MULTIVITAMIN WITH MINERALS) TABS tablet Take 1 tablet by mouth daily.     NON FORMULARY Take 2 capsules by mouth daily. Vitamin to help lower cholesterol     Probiotic Product (PROBIOTIC DAILY PO) Take 1 tablet by mouth  daily.     tadalafil (CIALIS) 20 MG tablet TAKE 1 TABLET BY MOUTH EVERY OTHER DAY AS NEEDED FOR ERECTILE DYSFUNCTION. 10 tablet 11   No current facility-administered medications for this visit.    Allergies-reviewed and updated Allergies  Allergen Reactions   Ace Inhibitors     Gout   Atorvastatin     REACTION: breast tenderness   Doxycycline     REACTION: stomach cramping. flatus   Influenza Vaccines     Hospitalization in the past for 4 days after immunization    Social History   Social History Narrative   Married 44 years (wife patient of Dr. Durene Cal), 1 son attorney in [place]- 2 daughts, daughter  With MS and 2 granddaughters.       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    Had worked for for [employer]. A lot of travel including international.       Hobbies: garden, cooking, beach trips      Moved to [facility] to be closer to daughter    Objective  Objective:  BP 138/78   Pulse (!) 50   Temp (!) 97.2 F (36.2 C)   Ht 5\' 10"  (1.778 m)   Wt 217 lb 3.2 oz (98.5 kg)   SpO2 97%   BMI 31.16 kg/m  Gen: NAD, resting comfortably HEENT: Mucous membranes are moist. Oropharynx normal Neck: no thyromegaly CV: RRR no murmurs rubs or gallops Lungs: CTAB no crackles, wheeze, rhonchi Abdomen: soft/nontender/nondistended/normal bowel sounds. No rebound or guarding.  Ext: no edema Skin: warm, dry Neuro: grossly normal, moves all extremities, PERRLA   Assessment and Plan  77 y.o. male presenting for annual physical.  Health Maintenance counseling: 1. Anticipatory guidance: Patient counseled regarding regular dental exams -q6 months and has upcoming implant replacement, eye exams -yearly,  avoiding smoking and second hand smoke , limiting alcohol to 2 beverages per day - 10 or less, no illicit drugs .   2. Risk factor reduction:  Advised patient of need for regular exercise and diet rich and fruits and vegetables to reduce risk of heart  attack and stroke.  Exercise- once or twice a week riding bike. 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  Wt Readings from Last 3 Encounters:  01/24/23 217 lb 3.2 oz (98.5 kg)  12/16/21 212 lb 9.6 oz (96.4 kg)  08/12/20 219 lb (99.3 kg)  3. Immunizations/screenings/ancillary  studies-severe reaction to flu shot in the past so we have opted out.  Has wanted to hold off on RSV and COVID vaccination.  Also holding off shingrix vaccination -Is now due for Tdap at pharmacy- recommend at pharmacy  Immunization History  Administered Date(s) Administered   Hepatitis A 03/03/2002   Hepatitis B 03/03/2002   PFIZER(Purple Top)SARS-COV-2 Vaccination 03/28/2019, 04/25/2019   Pneumococcal Conjugate-13 04/25/2014   Pneumococcal Polysaccharide-23 08/30/2012   Td 03/01/2002   Tdap 08/30/2012  4. Prostate cancer screening-  past age based screening recommendations - opts out as well Lab Results  Component Value Date   PSA 1.28 05/24/2017   PSA 1.40 03/15/2016   PSA 1.52 05/03/2014   5. Colon cancer screening - History of rectal adenoma with high-grade dysplasia iin past- 2014 colonoscopy without evidence-i stil lwould like for him to have one more colonoscopy based on last note Dr. Leone Payor "2-3 colon adenomas max 10 mm Fundic gland gastric polyps" and stated repeat 07/2015- we tried to refer twice in 2017 and 2019- encouraged to schedule in [facility]- would be more convenient- he is still trying to get set up 6. Skin cancer screening-history of melanoma in situ followed at [facility] cancer center.he has regular follow up now. advised regular sunscreen use. Denies worrisome, changing, or new skin lesions.  7. Smoking associated screening (lung cancer screening, AAA screen 65-75,  UA)- never smoker 8. STD screening - opts out- only active with wife  Status of chronic or acute concerns   #Proteinuria-sees nephrology in [place]. Was just seen 6 weeks ago and had good report. Has been told to avoid NSAIDs but apparently they were ok on Celebrex which they prescribed.   #hypertension S: medication: Amlodipine-olmesartan 10-40 mg, metoprolol 50 mg extended release -Gout on hydrochlorothiazide in the past Home readings #s: 130s/70s or 80s BP Readings from Last 3 Encounters:  01/24/23 138/78  12/16/21 130/76  08/12/20 (!) 146/82  A/P: blood pressure high acceptable but just drove in rain/heavy traffic 3 hours to get here- home readings have looked better- continue current medications   #hyperlipidemia S: Medication: none  -Gynecomastia in the past on atorvastatin  Previously we discussed the following options and discussed again today "1. Pulse dose statin rosuvastatin 20mg  weekly 2. Zetia 3. Lifestyle changes" Lab Results  Component Value Date   CHOL 204 (H) 12/16/2021   HDL 52.90 12/16/2021   LDLCALC 123 (H) 12/16/2021   LDLDIRECT 150.0 08/12/2020   TRIG 142.0 12/16/2021   CHOLHDL 4 12/16/2021  A/P: discussed options of pulse dose or zetia again- declines- update lipids- also mentioned CT calcium scoring- he may consider when establishes in [place]    #hypothyroidism S: compliant On thyroid medication-levothyroxine 112 mcg Lab Results  Component Value Date   TSH 3.430 03/26/2022  A/P:hopefully stable- update tsh today. Continue current meds for now     # Hyperglycemia/insulin resistance/prediabetes-A1c up to 5.9 in the past in 2022 S:  Medication: None Lab Results  Component Value Date   HGBA1C 5.8 12/16/2021   HGBA1C 5.9 08/12/2020   HGBA1C 5.6 09/18/2019  A/P: hopefully stable- update a1c today. Continue without meds for now    #Erectile dysfunction-tadalafil as needed - has not always worked well but last refill almost 30 months  ago- refill today   #Recurrent cold sores-uses acyclovir as needed  Recommended follow up: Return in about 1 year (around 01/24/2024) for physical or sooner if needed.Schedule b4 you leave. But may establish in [place] prior to then  Lab/Order associations: fasting   ICD-10-CM   1. Preventative health care  Z00.00     2. Essential hypertension  I10     3. Hyperglycemia  R73.9     4. Screening for diabetes mellitus  Z13.1     5. Hyperlipidemia, unspecified hyperlipidemia type  E78.5     6. Hypothyroidism, unspecified type  E03.9       No orders of the defined types were placed in this encounter.   Return precautions advised.  Tana Conch, MD

## 2023-01-24 NOTE — Patient Instructions (Addendum)
Contact local gastroenterology and send [place] request if you need referral for colonoscopy  Recommend Tetanus, Diphtheria, and Pertussis (Tdap) at pharmacy  Lets work on mild weight loss  Please stop by lab before you go If you have mychart- we will send your results within 3 business days of [place] receiving them.  If you do not have mychart- we will call you about results within 5 business days of [place] receiving them.  *please also note that you will see labs on mychart as soon as they post. I will later go in and write notes on them- will say "notes from Dr. Durene Cal"   Recommended follow up: Return in about 1 year (around 01/24/2024) for physical or sooner if needed.Schedule b4 you leave.

## 2023-03-16 ENCOUNTER — Other Ambulatory Visit: Payer: Self-pay | Admitting: Family Medicine

## 2023-03-18 ENCOUNTER — Other Ambulatory Visit: Payer: Self-pay | Admitting: Family Medicine

## 2023-06-15 ENCOUNTER — Other Ambulatory Visit: Payer: Self-pay | Admitting: Family Medicine

## 2023-09-10 ENCOUNTER — Other Ambulatory Visit: Payer: Self-pay | Admitting: Family Medicine

## 2023-12-08 ENCOUNTER — Other Ambulatory Visit: Payer: Self-pay | Admitting: Family Medicine

## 2024-01-26 ENCOUNTER — Encounter: Admitting: Family Medicine

## 2024-01-27 ENCOUNTER — Encounter: Payer: Self-pay | Admitting: Family Medicine

## 2024-01-27 ENCOUNTER — Ambulatory Visit: Payer: Self-pay | Admitting: Family Medicine

## 2024-01-27 ENCOUNTER — Ambulatory Visit: Admitting: Family Medicine

## 2024-01-27 VITALS — BP 134/70 | HR 48 | Temp 97.2°F | Ht 70.0 in | Wt 214.2 lb

## 2024-01-27 DIAGNOSIS — R739 Hyperglycemia, unspecified: Secondary | ICD-10-CM | POA: Diagnosis not present

## 2024-01-27 DIAGNOSIS — I10 Essential (primary) hypertension: Secondary | ICD-10-CM

## 2024-01-27 DIAGNOSIS — E785 Hyperlipidemia, unspecified: Secondary | ICD-10-CM | POA: Diagnosis not present

## 2024-01-27 DIAGNOSIS — Z0000 Encounter for general adult medical examination without abnormal findings: Secondary | ICD-10-CM

## 2024-01-27 DIAGNOSIS — Z131 Encounter for screening for diabetes mellitus: Secondary | ICD-10-CM | POA: Diagnosis not present

## 2024-01-27 DIAGNOSIS — Z1211 Encounter for screening for malignant neoplasm of colon: Secondary | ICD-10-CM

## 2024-01-27 DIAGNOSIS — E039 Hypothyroidism, unspecified: Secondary | ICD-10-CM

## 2024-01-27 LAB — COMPREHENSIVE METABOLIC PANEL WITH GFR
ALT: 13 U/L (ref 0–53)
AST: 17 U/L (ref 0–37)
Albumin: 4.4 g/dL (ref 3.5–5.2)
Alkaline Phosphatase: 69 U/L (ref 39–117)
BUN: 19 mg/dL (ref 6–23)
CO2: 28 meq/L (ref 19–32)
Calcium: 9.5 mg/dL (ref 8.4–10.5)
Chloride: 103 meq/L (ref 96–112)
Creatinine, Ser: 1.35 mg/dL (ref 0.40–1.50)
GFR: 50.32 mL/min — ABNORMAL LOW (ref >60.00)
Glucose, Bld: 104 mg/dL — ABNORMAL HIGH (ref 70–99)
Potassium: 4.5 meq/L (ref 3.5–5.1)
Sodium: 139 meq/L (ref 135–145)
Total Bilirubin: 0.7 mg/dL (ref 0.2–1.2)
Total Protein: 7.1 g/dL (ref 6.0–8.3)

## 2024-01-27 LAB — LIPID PANEL
Cholesterol: 226 mg/dL — ABNORMAL HIGH (ref 0–200)
HDL: 55 mg/dL (ref >39.00)
LDL Cholesterol: 149 mg/dL — ABNORMAL HIGH (ref 0–99)
NonHDL: 171.23
Total CHOL/HDL Ratio: 4
Triglycerides: 113 mg/dL (ref 0.0–149.0)
VLDL: 22.6 mg/dL (ref 0.0–40.0)

## 2024-01-27 LAB — CBC WITH DIFFERENTIAL/PLATELET
Basophils Absolute: 0 K/uL (ref 0.0–0.1)
Basophils Relative: 0.6 % (ref 0.0–3.0)
Eosinophils Absolute: 0.2 K/uL (ref 0.0–0.7)
Eosinophils Relative: 3.6 % (ref 0.0–5.0)
HCT: 48 % (ref 39.0–52.0)
Hemoglobin: 16.5 g/dL (ref 13.0–17.0)
Lymphocytes Relative: 20.9 % (ref 12.0–46.0)
Lymphs Abs: 1.1 K/uL (ref 0.7–4.0)
MCHC: 34.3 g/dL (ref 30.0–36.0)
MCV: 92.4 fl (ref 78.0–100.0)
Monocytes Absolute: 0.5 K/uL (ref 0.1–1.0)
Monocytes Relative: 9.8 % (ref 3.0–12.0)
Neutro Abs: 3.4 K/uL (ref 1.4–7.7)
Neutrophils Relative %: 65.1 % (ref 43.0–77.0)
Platelets: 236 K/uL (ref 150.0–400.0)
RBC: 5.2 Mil/uL (ref 4.22–5.81)
RDW: 13.4 % (ref 11.5–15.5)
WBC: 5.2 K/uL (ref 4.0–10.5)

## 2024-01-27 LAB — TSH: TSH: 5.43 u[IU]/mL (ref 0.35–5.50)

## 2024-01-27 LAB — HEMOGLOBIN A1C: Hgb A1c MFr Bld: 5.6 % (ref 4.6–6.5)

## 2024-01-27 NOTE — Progress Notes (Signed)
 Phone: [phone]   Subjective:  Patient presents today for their annual physical. Chief complaint-noted.   See problem oriented charting- ROS- full  review of systems was completed and negative  except for topics noted under acute/chronic concerns  The following were reviewed and entered/updated in epic: Past Medical History:  Diagnosis Date   Abnormal mammogram 04/03/2008   L side- lipitor related, resolved off medication     Arthritis    Benign fundic gland polyps of stomach    Cancer (HCC)    jaw removal due to cancer- 40 years ago    GERD (gastroesophageal reflux disease)    GI bleed 2005   after colonoscopy   Gout    HTN (hypertension)    Hyperlipidemia    Hypothyroidism    Irregular bowel habits    occ. irregular bowel elimination   Personal history of rectal adenoma with high-grade dysplasia and colonic adenomas    Patient Active Problem List   Diagnosis Date Noted   Proteinuria 02/02/2018    Priority: Medium    Hyperglycemia 03/22/2016    Priority: Medium    Recurrent cold sores 01/07/2015    Priority: Medium    Hypothyroid 04/28/2011    Priority: Medium    Hyperlipidemia 12/09/2006    Priority: Medium    Essential hypertension 08/18/2006    Priority: Medium    Erectile dysfunction 01/07/2015    Priority: Low   S/P right THA, AA 10/08/2014    Priority: Low   Knee osteoarthritis 04/25/2014    Priority: Low   GERD (gastroesophageal reflux disease) 08/03/2012    Priority: Low   Osteoarthritis 02/27/2010    Priority: Low   Gout 08/18/2006    Priority: Low   History of colonic polyps 08/18/2006    Priority: Low   Past Surgical History:  Procedure Laterality Date   COLONOSCOPY     JOINT REPLACEMENT Right 09/2013   left knee0 09/2013, right knee- 2011    KNEE ARTHROSCOPY  2011   Dr. Duwayne   KNEE ARTHROSCOPY WITH MEDIAL MENISECTOMY Left 06/29/2013   Procedure: LEFT KNEE ARTHROSCOPY WITH DEBRIDEMENT AND A PARTIAL MEDIAL MENISECTOMY;  Surgeon: Reyes JAYSON Duwayne, MD;  Location: [facility];  Service: Orthopedics;  Laterality: Left;   TONSILLECTOMY     TOTAL HIP ARTHROPLASTY Right 10/08/2014   Procedure: RIGHT TOTAL HIP ARTHROPLASTY ANTERIOR APPROACH;  Surgeon: Donnice Car, MD;  Location: [facility];  Service: Orthopedics;  Laterality: Right;   TOTAL KNEE ARTHROPLASTY  6/11, 09/2013   Dr. Duwayne   TOTAL KNEE ARTHROPLASTY Left 09/28/2013   Procedure: LEFT TOTAL KNEE ARTHROPLASTY;  Surgeon: Reyes JAYSON Duwayne, MD;  Location: [facility];  Service: Orthopedics;  Laterality: Left;   TUMOR REMOVAL  1984   from Jaw-R jaw-large dissectoin-bone from hip in jaw   UPPER GASTROINTESTINAL ENDOSCOPY      Family History  Problem Relation Age of Onset   Stroke Mother    Diabetes Mother    Colon cancer Neg Hx     Medications- reviewed and updated Current Outpatient Medications  Medication Sig Dispense Refill   acyclovir  (ZOVIRAX ) 400 MG tablet Take 1 tablet (400 mg total) by mouth 5 (five) times daily. For 5 days maximum for cold sores 50 tablet 1   amLODipine -olmesartan  (AZOR ) 10-40 MG tablet TAKE 1 TABLET BY MOUTH EVERY DAY 90 tablet 3   celecoxib  (CELEBREX ) 200 MG capsule Take 200 mg by mouth daily.     Famotidine (PEPCID PO) Take by mouth.  glucosamine-chondroitin 500-400 MG tablet Take 2 tablets by mouth daily.     levothyroxine  (SYNTHROID ) 112 MCG tablet TAKE 1 TABLET BY MOUTH EVERY DAY 90 tablet 0   metoprolol  succinate (TOPROL -XL) 50 MG 24 hr tablet TAKE 1 TABLET BY MOUTH DAILY WITH OR IMMEDIATELY FOLLOWING A MEAL 90 tablet 3   Multiple Vitamin (MULTIVITAMIN WITH MINERALS) TABS tablet Take 1 tablet by mouth daily.     NON FORMULARY Take 2 capsules by mouth daily. Vitamin to help lower cholesterol     Probiotic Product (PROBIOTIC DAILY PO) Take 1 tablet by mouth daily.     tadalafil  (CIALIS ) 20 MG tablet TAKE 1 TABLET BY MOUTH EVERY OTHER DAY AS NEEDED FOR ERECTILE DYSFUNCTION. 10 tablet 11   No current facility-administered medications for this visit.     Allergies-reviewed and updated Allergies[1]  Social History   Social History Narrative   Married 44 years (wife patient of Dr. Katrinka), 1 son attorney in [place]- 2 daughts, daughter  With MS and 2 granddaughters.       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    Had worked for for [employer]. A lot of travel including international.       Hobbies: garden, cooking, beach trips      Moved to [facility] to be closer to daughter    Objective  Objective:  BP 134/70 (BP Location: Left Arm, Patient Position: Sitting, Cuff Size: Large)   Pulse (!) 48   Temp (!) 97.2 F (36.2 C) (Temporal)   Ht 5' 10 (1.778 m)   Wt 214 lb 4 oz (97.2 kg)   SpO2 98%   BMI 30.74 kg/m  Gen: NAD, resting comfortably HEENT: Mucous membranes are moist. Oropharynx normal Neck: no thyromegaly CV: RRR no murmurs rubs or gallops Lungs: CTAB no crackles, wheeze, rhonchi Abdomen: soft/nontender/nondistended/normal bowel sounds. No rebound or guarding.  Ext: no edema Skin: warm, dry Neuro: grossly normal, moves all extremities, PERRLA   Assessment and Plan  78 y.o. male presenting for annual physical.  Health Maintenance counseling: 1. Anticipatory guidance: Patient counseled regarding regular dental exams -q6 months, eye exams - yearly,  avoiding smoking and second hand smoke , limiting alcohol to 2 beverages per day - 7-8 per week, no illicit drugs .   2. Risk factor reduction:  Advised patient of need for regular exercise and diet rich and fruits and vegetables to reduce risk of heart attack and stroke.  Exercise- bike twice a week at gym for most part.  Diet/weight management-down 3 lbs from last year- congratulated efforts.  Wt Readings from Last 3 Encounters:  01/27/24 214 lb 4 oz (97.2 kg)  01/24/23 217 lb 3.2 oz (98.5 kg)  12/16/21 212 lb 9.6 oz (96.4 kg)  3. Immunizations/screenings/ancillary studies- holding off on COVID, flu, Tetanus, Diphtheria, and  Pertussis (Tdap)  -considering shingrix Immunization History  Administered Date(s) Administered   Hepatitis A 03/03/2002   Hepatitis B 03/03/2002   PFIZER(Purple Top)SARS-COV-2 Vaccination 03/28/2019, 04/25/2019   Pneumococcal Conjugate-13 04/25/2014   Pneumococcal Polysaccharide-23 08/30/2012   Td 03/01/2002   Tdap 08/30/2012  4. Prostate cancer screening- past age based screening recommendations - low risk prior trend. No change in urinary symptom(s)   Lab Results  Component Value Date   PSA 1.28 05/24/2017   PSA 1.40 03/15/2016   PSA 1.52 05/03/2014   5. Colon cancer screening - History of rectal adenoma with high-grade dysplasia iin past- 2014 colonoscopy without  evidence-i stil lwould like for him to have one more colonoscopy based on last note Dr. Avram 2-3 colon adenomas max 10 mm Fundic gland gastric polyps and stated repeat 07/2015- we tried to refer twice in 2017 and 2019- encouraged to schedule in [place] - has not found a place in [employer] - he plans to look for one and either establish or reach out for referral 6. Skin cancer screening- history melanoma in situ follow up at [facility] cancer center. advised regular sunscreen use. Denies worrisome, changing, or new skin lesions.  7. Smoking associated screening (lung cancer screening, AAA screen 65-75, UA)- never smoker 8. STD screening - only active with wife  Status of chronic or acute concerns   #social update- still working  #Proteinuria-sees nephrology in [place]  #hypertension S: medication: Amlodipine -olmesartan  10-40 mg, metoprolol  50 mg extended release Home readings #s: 130s but up to 150's BP Readings from Last 3 Encounters:  01/27/24 134/70  01/24/23 138/78  12/16/21 130/76  A/P: well controlled in office- continue current medications . Nephrology follows protein levels -In regards to prior proteinuria was set up with nephrology in [place] -NSAIDs were removed other than  celebrex   #hyperlipidemia S: Medication:none  -Gynecomastia in the past on atorvastatin  Previously we discussed the following options and discussed again today 1. Pulse dose statin rosuvastatin  20mg  weekly 2. Zetia 3. Lifestyle changes Lab Results  Component Value Date   CHOL 224 (H) 01/24/2023   HDL 48.90 01/24/2023   LDLCALC 156 (H) 01/24/2023   LDLDIRECT 150.0 08/12/2020   TRIG 96.0 01/24/2023   CHOLHDL 5 01/24/2023  A/P: with prior side effects wants to focus on lifestyle - does to continue to gradually lose weight- congratulated efforts again   #hypothyroidism S: compliant On thyroid  medication-levothyroxine  112 mcg Lab Results  Component Value Date   TSH 3.45 01/24/2023  A/P:hopefully stable- update tsh today. Continue current meds for now     # Hyperglycemia/insulin resistance/prediabetes-A1c up to 5.9 in the past in 2022 S:  Medication: None Lab Results  Component Value Date   HGBA1C 5.8 01/24/2023   HGBA1C 5.8 12/16/2021   HGBA1C 5.9 08/12/2020   A/P: with weigh tloss mild improvement- update levels today   #Erectile dysfunction-tadalafil  as needed - not always perfect  #Recurrent cold sores-uses acyclovir  as needed. Non in 3 years  Recommended follow up: Return in about 1 year (around 01/26/2025) for physical or sooner if needed.Schedule b4 you leave.  Lab/Order associations: fasting   ICD-10-CM   1. Preventative health care  Z00.00     2. Screening for colon cancer  Z12.11     3. Hyperlipidemia, unspecified hyperlipidemia type  E78.5 TSH    Lipid panel    CBC with Differential/Platelet    Comprehensive metabolic panel    4. Hypothyroidism, unspecified type  E03.9 TSH    5. Hyperglycemia  R73.9 Hemoglobin A1c    6. Screening for diabetes mellitus  Z13.1 Hemoglobin A1c    7. Essential hypertension  I10 Lipid panel    CBC with Differential/Platelet    Comprehensive metabolic panel      No orders of the defined types were placed in this  encounter.   Return precautions advised.  Garnette Lukes, MD      [1]  Allergies Allergen Reactions   Ace Inhibitors     Gout   Atorvastatin     REACTION: breast tenderness   Doxycycline     REACTION: stomach cramping. flatus   Influenza Vaccines  Hospitalization in the past for 4 days after immunization

## 2024-01-27 NOTE — Patient Instructions (Addendum)
 I really do think one more colonoscopy would be ideal- let me know if you need referral once you find office  Tetanus, Diphtheria, and Pertussis (Tdap) and Shingrix at pharmacy  Please stop by lab before you go If you have mychart- we will send your results within 3 business days of us  receiving them.  If you do not have mychart- we will call you about results within 5 business days of us  receiving them.  *please also note that you will see labs on mychart as soon as they post. I will later go in and write notes on them- will say notes from Dr. Katrinka   Recommended follow up: Return in about 1 year (around 01/26/2025) for physical or sooner if needed.Schedule b4 you leave.

## 2024-03-11 ENCOUNTER — Other Ambulatory Visit: Payer: Self-pay | Admitting: Family Medicine

## 2025-01-30 ENCOUNTER — Encounter: Admitting: Family Medicine
